# Patient Record
Sex: Male | Born: 1965 | Race: Black or African American | Hispanic: No | Marital: Married | Smoking: Former smoker
Health system: Southern US, Community
[De-identification: ages and names within clinical notes are randomized; demographics above are authoritative.]

## PROBLEM LIST (undated history)

## (undated) DIAGNOSIS — E7800 Pure hypercholesterolemia, unspecified: Secondary | ICD-10-CM

## (undated) DIAGNOSIS — R079 Chest pain, unspecified: Secondary | ICD-10-CM

## (undated) DIAGNOSIS — Z87442 Personal history of urinary calculi: Secondary | ICD-10-CM

## (undated) DIAGNOSIS — E119 Type 2 diabetes mellitus without complications: Secondary | ICD-10-CM

## (undated) DIAGNOSIS — R030 Elevated blood-pressure reading, without diagnosis of hypertension: Secondary | ICD-10-CM

## (undated) DIAGNOSIS — R9389 Abnormal findings on diagnostic imaging of other specified body structures: Secondary | ICD-10-CM

## (undated) DIAGNOSIS — I10 Essential (primary) hypertension: Secondary | ICD-10-CM

## (undated) DIAGNOSIS — IMO0001 Reserved for inherently not codable concepts without codable children: Secondary | ICD-10-CM

## (undated) HISTORY — PX: FRACTURE SURGERY: SHX138

## (undated) HISTORY — PX: HAND TENDON SURGERY: SHX663

---

## 1999-11-15 ENCOUNTER — Encounter: Payer: Self-pay | Admitting: Emergency Medicine

## 1999-11-15 ENCOUNTER — Emergency Department (HOSPITAL_COMMUNITY): Admission: EM | Admit: 1999-11-15 | Discharge: 1999-11-15 | Payer: Self-pay | Admitting: Emergency Medicine

## 2005-03-31 ENCOUNTER — Emergency Department (HOSPITAL_COMMUNITY): Admission: EM | Admit: 2005-03-31 | Discharge: 2005-03-31 | Payer: Self-pay | Admitting: Emergency Medicine

## 2005-04-08 ENCOUNTER — Ambulatory Visit: Payer: Self-pay | Admitting: Pulmonary Disease

## 2005-04-30 ENCOUNTER — Ambulatory Visit: Payer: Self-pay | Admitting: Pulmonary Disease

## 2006-06-25 ENCOUNTER — Ambulatory Visit: Payer: Self-pay | Admitting: Cardiovascular Disease

## 2009-04-02 ENCOUNTER — Encounter: Payer: Self-pay | Admitting: Cardiovascular Disease

## 2009-04-04 ENCOUNTER — Encounter: Payer: Self-pay | Admitting: Cardiovascular Disease

## 2009-04-25 DIAGNOSIS — Z9189 Other specified personal risk factors, not elsewhere classified: Secondary | ICD-10-CM | POA: Insufficient documentation

## 2009-05-01 ENCOUNTER — Ambulatory Visit: Payer: Self-pay | Admitting: Cardiovascular Disease

## 2009-05-01 DIAGNOSIS — R072 Precordial pain: Secondary | ICD-10-CM | POA: Insufficient documentation

## 2009-05-02 ENCOUNTER — Telehealth (INDEPENDENT_AMBULATORY_CARE_PROVIDER_SITE_OTHER): Payer: Self-pay | Admitting: *Deleted

## 2009-05-08 ENCOUNTER — Telehealth (INDEPENDENT_AMBULATORY_CARE_PROVIDER_SITE_OTHER): Payer: Self-pay | Admitting: *Deleted

## 2009-05-09 ENCOUNTER — Ambulatory Visit: Payer: Self-pay | Admitting: Cardiology

## 2009-05-09 ENCOUNTER — Encounter (HOSPITAL_COMMUNITY): Admission: RE | Admit: 2009-05-09 | Discharge: 2009-06-07 | Payer: Self-pay | Admitting: Cardiovascular Disease

## 2009-05-09 ENCOUNTER — Ambulatory Visit: Payer: Self-pay

## 2009-05-15 ENCOUNTER — Encounter (INDEPENDENT_AMBULATORY_CARE_PROVIDER_SITE_OTHER): Payer: Self-pay

## 2009-10-08 ENCOUNTER — Encounter: Admission: RE | Admit: 2009-10-08 | Discharge: 2009-10-08 | Payer: Self-pay | Admitting: Family Medicine

## 2010-02-07 ENCOUNTER — Encounter: Admission: RE | Admit: 2010-02-07 | Discharge: 2010-02-07 | Payer: Self-pay | Admitting: Family Medicine

## 2010-09-28 ENCOUNTER — Emergency Department (HOSPITAL_COMMUNITY)
Admission: EM | Admit: 2010-09-28 | Discharge: 2010-09-28 | Disposition: A | Discharge status: Home / Self Care | Payer: Managed Care, Other (non HMO) | Attending: Emergency Medicine | Admitting: Emergency Medicine

## 2010-09-28 DIAGNOSIS — S61209A Unspecified open wound of unspecified finger without damage to nail, initial encounter: Secondary | ICD-10-CM | POA: Insufficient documentation

## 2010-09-28 DIAGNOSIS — M79609 Pain in unspecified limb: Secondary | ICD-10-CM | POA: Insufficient documentation

## 2010-09-28 DIAGNOSIS — Y929 Unspecified place or not applicable: Secondary | ICD-10-CM | POA: Insufficient documentation

## 2010-09-28 DIAGNOSIS — W268XXA Contact with other sharp object(s), not elsewhere classified, initial encounter: Secondary | ICD-10-CM | POA: Insufficient documentation

## 2010-10-25 NOTE — Procedures (Signed)
[facility]                              EXERCISE TREADMILL   NAME:Raymond Harrell, Raymond Harrell                MRN:          [id number]  DATE:06/25/2005                            DOB:          1965-12-27    INDICATION:  Raymond Harrell is a 45 year old male with atypical chest  pain and nonspecific T wave changes on his resting EKG.   Raymond Harrell exercised according to the Bruce protocol for 7 minutes  and 42 seconds, achieving a work level of 9.8 metabolic equivalents. He  achieved 86% of his age predicted maximal heart rate with a heart rate  of 155 beats per minute. Raymond Harrell had a resting blood pressure of  146/88 and a peak blood pressure of 203/80 at state 3 of exercise. The  test was stopped due to fatigue. He had no significant ST changes or  arrhythmias with exercise. He had transient sharp, atypical chest pain  during stage 3 of exercise that lasted for only a few seconds. He has  fair exercise tolerance. He had a hypertensive response to exercise.   FINAL CONCLUSION:  A negative exercise ECG.     Raymond Harrell. Excell Seltzer, MD  Electronically Signed    MDC/MedQ  DD: 06/25/2006  DT: 06/25/2006  Job #: [id number]   cc:   Jonita Albee, M.D.

## 2010-10-25 NOTE — Letter (Signed)
June 25, 2006    Jonita Albee, M.D.  [facility]  [address]  [place], [place] [address]   RE:  Raymond Harrell, Raymond Harrell  MRN:  [id number]  /  DOB:  [birth year]   Dear Dr. Perrin Maltese:   I saw Raymond Harrell today as an outpatient at the [facility]. As you know, he is a very nice 45 year old male who  presents for evaluation of chest pain. He describes a recent onset of  sharp stabbing pains in the left chest that lasts for only a few  seconds. They tend to occur at any time and seem to be unrelated to  exertion. The pains are non-pleuritic. He had a similar episode  approximately 1 year ago that resolved on its own. He describes 3 to 4  episodes of this pain daily. He does not have any associated symptoms of  diaphoresis, nausea, vomiting or lightheadedness. He has no past history  of cardiac problems.   Medications currently: On doxycycline for a 10-day course.   Allergies: No known drug allergies.   Past medical history: The patient has no history of medical problems or  surgeries.   Family history: His mother died at age 17 of an infection. His father  died in his 86s of heart disease. He has 3 siblings in their 40s and  30s, all of whom are healthy.   Social history: The patient is originally from [place]. He works as  a Engineer, manufacturing systems, Designer, jewellery. He is married with 3 children. He  does not smoke cigarettes, but is a former cigarette smoker. He quit in  March of 2006. He has no history of drug use. He drinks 40 ounces of  beer daily. He drinks 1 to 2 cups of caffeinated beverages daily.   Review of systems: A complete 12-point review systems was performed.  Pertinent positives included occasional lightheadedness and sexual  dysfunction. Also, of note, he has gained 30 pounds over the last 1  year. All other systems were reviewed and are negative except as  described above.   Physical examination: The patient is alert and  oriented. He is in no  acute distress. His weight is 270 pounds. Height is 6 feet, 5 inches.  Blood pressure is 130/82, heart rate is 70, respiratory rate is 12.  HEENT: Is normal.  NECK: Normal carotid upstrokes without bruits. Jugular venous pressure  is normal. There is no thyromegaly or thyroid nodules. There is no  cervical lymphadenopathy.  LUNGS:  Clear to auscultation bilaterally.  CARDIOVASCULAR: Apex is discrete and nondisplaced. The heart is regular  rate and rhythm without murmurs or gallops.  ABDOMEN: Soft and nontender. No organomegaly. No abdominal bruits. No  rebound or guarding.  EXTREMITIES: No clubbing, cyanosis or edema. Peripheral pulses are 2+  and equal throughout.  SKIN: Is warm and dry without rash.  NEUROLOGIC: Cranial nerves II-XII are intact. Strength is 5/5 and equal  in the arms and legs bilaterally.   EKG: Demonstrates normal sinus rhythm with a nonspecific T-wave  abnormality.   Assessment: Raymond Harrell is a 45 year old gentleman with atypical  chest pain. His cardiac risks include former tobacco use, obesity, and  family history of heart disease. I elected to perform an exercise EKG  stress test today. Raymond Harrell exercised into Stage 3 of the Bruce  protocol. He had some transient sharp chest pain during exercise. He had  no ST segment changes and had a negative stress  test.   In summary, Raymond Harrell is likely having non-cardiac chest pain. I  reassured him. I advised him of the importance of regular aerobic  exercise and weight loss as he is clearly is at risk of cardiovascular  problems down the road as he ages if he continues to gain weight.   Dr. Perrin Maltese, thanks again for allowing me to see Raymond Harrell. I am  also sending you a copy of his formal stress test report. Please feel  free to call me at any time with questions regarding his care.    Sincerely,      Veverly Fells. Excell Seltzer, MD  Electronically Signed    MDC/MedQ  DD:  06/25/2006  DT: 06/25/2006  Job #: [id number]

## 2011-06-20 ENCOUNTER — Ambulatory Visit (INDEPENDENT_AMBULATORY_CARE_PROVIDER_SITE_OTHER): Payer: Managed Care, Other (non HMO)

## 2011-06-20 DIAGNOSIS — R209 Unspecified disturbances of skin sensation: Secondary | ICD-10-CM

## 2012-02-18 ENCOUNTER — Encounter (HOSPITAL_COMMUNITY): Payer: Self-pay | Admitting: Emergency Medicine

## 2012-02-18 ENCOUNTER — Inpatient Hospital Stay (HOSPITAL_COMMUNITY): Payer: Managed Care, Other (non HMO)

## 2012-02-18 ENCOUNTER — Inpatient Hospital Stay (HOSPITAL_COMMUNITY)
Admission: EM | Admit: 2012-02-18 | Discharge: 2012-02-20 | DRG: 204 | Disposition: A | Discharge status: Home / Self Care | Payer: Managed Care, Other (non HMO) | Attending: Cardiovascular Disease | Admitting: Cardiovascular Disease

## 2012-02-18 ENCOUNTER — Emergency Department (HOSPITAL_COMMUNITY): Payer: Managed Care, Other (non HMO)

## 2012-02-18 DIAGNOSIS — R071 Chest pain on breathing: Principal | ICD-10-CM | POA: Diagnosis present

## 2012-02-18 DIAGNOSIS — E785 Hyperlipidemia, unspecified: Secondary | ICD-10-CM | POA: Diagnosis present

## 2012-02-18 DIAGNOSIS — J9819 Other pulmonary collapse: Secondary | ICD-10-CM | POA: Diagnosis present

## 2012-02-18 DIAGNOSIS — E119 Type 2 diabetes mellitus without complications: Secondary | ICD-10-CM | POA: Diagnosis present

## 2012-02-18 DIAGNOSIS — I517 Cardiomegaly: Secondary | ICD-10-CM

## 2012-02-18 DIAGNOSIS — R9431 Abnormal electrocardiogram [ECG] [EKG]: Secondary | ICD-10-CM | POA: Diagnosis present

## 2012-02-18 DIAGNOSIS — R079 Chest pain, unspecified: Secondary | ICD-10-CM

## 2012-02-18 DIAGNOSIS — J479 Bronchiectasis, uncomplicated: Secondary | ICD-10-CM | POA: Diagnosis present

## 2012-02-18 DIAGNOSIS — Z87891 Personal history of nicotine dependence: Secondary | ICD-10-CM

## 2012-02-18 DIAGNOSIS — R030 Elevated blood-pressure reading, without diagnosis of hypertension: Secondary | ICD-10-CM | POA: Diagnosis present

## 2012-02-18 DIAGNOSIS — R918 Other nonspecific abnormal finding of lung field: Secondary | ICD-10-CM | POA: Diagnosis present

## 2012-02-18 DIAGNOSIS — R7611 Nonspecific reaction to tuberculin skin test without active tuberculosis: Secondary | ICD-10-CM

## 2012-02-18 DIAGNOSIS — E039 Hypothyroidism, unspecified: Secondary | ICD-10-CM

## 2012-02-18 DIAGNOSIS — Z7982 Long term (current) use of aspirin: Secondary | ICD-10-CM

## 2012-02-18 DIAGNOSIS — R946 Abnormal results of thyroid function studies: Secondary | ICD-10-CM | POA: Diagnosis present

## 2012-02-18 DIAGNOSIS — R0781 Pleurodynia: Secondary | ICD-10-CM | POA: Diagnosis present

## 2012-02-18 DIAGNOSIS — Z9189 Other specified personal risk factors, not elsewhere classified: Secondary | ICD-10-CM

## 2012-02-18 HISTORY — DX: Abnormal findings on diagnostic imaging of other specified body structures: R93.89

## 2012-02-18 HISTORY — DX: Elevated blood-pressure reading, without diagnosis of hypertension: R03.0

## 2012-02-18 HISTORY — DX: Chest pain, unspecified: R07.9

## 2012-02-18 HISTORY — DX: Pure hypercholesterolemia, unspecified: E78.00

## 2012-02-18 HISTORY — DX: Type 2 diabetes mellitus without complications: E11.9

## 2012-02-18 HISTORY — DX: Reserved for inherently not codable concepts without codable children: IMO0001

## 2012-02-18 LAB — PROTIME-INR
INR: 1.01 (ref 0.00–1.49)
Prothrombin Time: 13.5 seconds (ref 11.6–15.2)

## 2012-02-18 LAB — COMPREHENSIVE METABOLIC PANEL
ALT: 33 U/L (ref 0–53)
AST: 26 U/L (ref 0–37)
CO2: 21 mEq/L (ref 19–32)
Calcium: 9.4 mg/dL (ref 8.4–10.5)
Chloride: 101 mEq/L (ref 96–112)
GFR calc non Af Amer: >90 mL/min (ref >90)
Sodium: 136 mEq/L (ref 135–145)

## 2012-02-18 LAB — CBC
HCT: 41 % (ref 39.0–52.0)
Hemoglobin: 13.7 g/dL (ref 13.0–17.0)
WBC: 7.1 10*3/uL (ref 4.0–10.5)

## 2012-02-18 LAB — URINE MICROSCOPIC-ADD ON

## 2012-02-18 LAB — URINALYSIS, ROUTINE W REFLEX MICROSCOPIC
Glucose, UA: NEGATIVE mg/dL
Hgb urine dipstick: NEGATIVE
Protein, ur: 100 mg/dL — AB
Specific Gravity, Urine: 1.025 (ref 1.005–1.030)

## 2012-02-18 LAB — TROPONIN I: Troponin I: <0.3 ng/mL (ref <0.30)

## 2012-02-18 LAB — POCT I-STAT 3, ART BLOOD GAS (G3+): Acid-Base Excess: 1 mmol/L (ref 0.0–2.0)

## 2012-02-18 LAB — CBC WITH DIFFERENTIAL/PLATELET
Basophils Absolute: 0 10*3/uL (ref 0.0–0.1)
Eosinophils Relative: 1 % (ref 0–5)
Lymphocytes Relative: 53 % — ABNORMAL HIGH (ref 12–46)
Neutro Abs: 2.5 10*3/uL (ref 1.7–7.7)
Neutrophils Relative %: 37 % — ABNORMAL LOW (ref 43–77)
Platelets: 169 10*3/uL (ref 150–400)
RDW: 14.7 % (ref 11.5–15.5)
WBC: 6.6 10*3/uL (ref 4.0–10.5)

## 2012-02-18 LAB — HEPARIN LEVEL (UNFRACTIONATED): Heparin Unfractionated: 0.11 IU/mL — ABNORMAL LOW (ref 0.30–0.70)

## 2012-02-18 MED ORDER — HEPARIN (PORCINE) IN NACL 100-0.45 UNIT/ML-% IJ SOLN
1850.0000 [IU]/h | INTRAMUSCULAR | Status: DC
  Administered 2012-02-18 (×2): 1850 [IU]/h via INTRAVENOUS
  Filled 2012-02-18 (×5): qty 250

## 2012-02-18 MED ORDER — ALPRAZOLAM 0.25 MG PO TABS
0.5000 mg | ORAL_TABLET | Freq: Two times a day (BID) | ORAL | Status: DC | PRN
  Filled 2012-02-18: qty 1

## 2012-02-18 MED ORDER — HEPARIN (PORCINE) IN NACL 100-0.45 UNIT/ML-% IJ SOLN
1500.0000 [IU]/h | Freq: Once | INTRAMUSCULAR | Status: AC
  Administered 2012-02-18: 1500 [IU]/h via INTRAVENOUS
  Filled 2012-02-18: qty 250

## 2012-02-18 MED ORDER — ASPIRIN 325 MG PO TABS
325.0000 mg | ORAL_TABLET | Freq: Every day | ORAL | Status: DC
  Administered 2012-02-19 – 2012-02-20 (×2): 325 mg via ORAL
  Filled 2012-02-18 (×2): qty 1

## 2012-02-18 MED ORDER — ONDANSETRON HCL 4 MG/2ML IJ SOLN: 4.0000 mg | Freq: Four times a day (QID) | INTRAMUSCULAR | Status: DC | PRN

## 2012-02-18 MED ORDER — HEPARIN (PORCINE) IN NACL 100-0.45 UNIT/ML-% IJ SOLN: 16.0000 [IU]/kg/h | Freq: Once | INTRAMUSCULAR | Status: DC

## 2012-02-18 MED ORDER — HEPARIN BOLUS VIA INFUSION
3000.0000 [IU] | Freq: Once | INTRAVENOUS | Status: AC
  Administered 2012-02-18: 20:00:00 3000 [IU] via INTRAVENOUS

## 2012-02-18 MED ORDER — ACETAMINOPHEN 325 MG PO TABS: 650.0000 mg | ORAL_TABLET | ORAL | Status: DC | PRN

## 2012-02-18 MED ORDER — MORPHINE SULFATE 4 MG/ML IJ SOLN
4.0000 mg | Freq: Once | INTRAMUSCULAR | Status: AC
  Administered 2012-02-18: 4 mg via INTRAVENOUS
  Filled 2012-02-18: qty 1

## 2012-02-18 MED ORDER — NITROGLYCERIN 0.4 MG SL SUBL: 0.4000 mg | SUBLINGUAL_TABLET | SUBLINGUAL | Status: DC | PRN

## 2012-02-18 MED ORDER — IOHEXOL 350 MG/ML SOLN
100.0000 mL | Freq: Once | INTRAVENOUS | Status: AC | PRN
  Administered 2012-02-18: 100 mL via INTRAVENOUS

## 2012-02-18 MED ORDER — OMEGA-3-ACID ETHYL ESTERS 1 G PO CAPS
1.0000 g | ORAL_CAPSULE | Freq: Every day | ORAL | Status: DC
  Administered 2012-02-19 – 2012-02-20 (×2): 1 g via ORAL
  Filled 2012-02-18 (×2): qty 1

## 2012-02-18 MED ORDER — SODIUM CHLORIDE 0.9 % IV SOLN: 250.0000 mL | INTRAVENOUS | Status: DC | PRN

## 2012-02-18 MED ORDER — SODIUM CHLORIDE 0.9 % IJ SOLN
3.0000 mL | Freq: Two times a day (BID) | INTRAMUSCULAR | Status: DC
  Administered 2012-02-19: 10:00:00 3 mL via INTRAVENOUS

## 2012-02-18 MED ORDER — HYDROMORPHONE HCL PF 1 MG/ML IJ SOLN
1.0000 mg | INTRAMUSCULAR | Status: DC | PRN
  Administered 2012-02-18 – 2012-02-19 (×2): 1 mg via INTRAVENOUS
  Filled 2012-02-18 (×4): qty 1

## 2012-02-18 MED ORDER — KETOROLAC TROMETHAMINE 30 MG/ML IJ SOLN
30.0000 mg | Freq: Four times a day (QID) | INTRAMUSCULAR | Status: DC | PRN
  Administered 2012-02-18 – 2012-02-19 (×2): 30 mg via INTRAVENOUS
  Filled 2012-02-18 (×2): qty 1

## 2012-02-18 MED ORDER — NITROGLYCERIN IN D5W 200-5 MCG/ML-% IV SOLN
5.0000 ug/min | Freq: Once | INTRAVENOUS | Status: AC
  Administered 2012-02-18: 20:00:00 40 ug/min via INTRAVENOUS

## 2012-02-18 MED ORDER — HEPARIN BOLUS VIA INFUSION
4000.0000 [IU] | Freq: Once | INTRAVENOUS | Status: AC
  Administered 2012-02-18: 4000 [IU] via INTRAVENOUS

## 2012-02-18 MED ORDER — OMEGA-3 FATTY ACIDS 1000 MG PO CAPS: 1.0000 g | ORAL_CAPSULE | Freq: Every day | ORAL | Status: DC

## 2012-02-18 MED ORDER — NITROGLYCERIN IN D5W 200-5 MCG/ML-% IV SOLN
2.0000 ug/min | Freq: Once | INTRAVENOUS | Status: AC
  Administered 2012-02-18: 10 ug/min via INTRAVENOUS
  Filled 2012-02-18: qty 250

## 2012-02-18 MED ORDER — SODIUM CHLORIDE 0.9 % IJ SOLN: 3.0000 mL | INTRAMUSCULAR | Status: DC | PRN

## 2012-02-18 NOTE — ED Notes (Signed)
Pt started having muscle cramps in bil legs. Stated that this has been going on for a while.

## 2012-02-18 NOTE — ED Notes (Signed)
Pt went to radiology.

## 2012-02-18 NOTE — H&P (Signed)
History and Physical  Patient ID: Raymond Harrell MRN: [id number], DOB: 20-Mar-1966 Date of Encounter: 02/18/2012, 4:58 PM Primary Physician: [name], MD Primary Cardiologist: Dr. Excell Seltzer in 2010  Chief Complaint: chest pain  HPI: Raymond Harrell is a 46 y/o M who denies any prior cardiac hx but with ?glucose today, ?BP recently, former tobacco abuse, family hx of CAD who had a stress test for chest pain in 2010 that was normal in the setting of abnormal EKG. He reports that since Monday he has felt more progressively SOB with ambulation accompanied by some L sided sharp, stabbing chest radiating to his back. The pain has become markedly worse with inspiration and now occurs every time he breathes in. When it occurs, it lasts for brief seconds for a time. He also has had diaphoresis, but this predates today's symptoms. He took 2 ibuprofen on Monday but this did not help. He also takes a daily aspirin. He had an episode of nausea/vomiting x1 without hematemesis this morning, but was able to go on with his shower and drove on to work. While at work he noted increasing chest discomfort so an EKG was performed at his work and he was sent to the ER. He received 4 baby ASA prior to arrival. He also got SL NTG in route which did not change the pain. In the ER, he received a total of 8mg  of morphine with some reduction in his pain, heparin initiation, and NTG gtt now at 27mcg/min. He is not hypoxic or tachycardic, but looks diaphoretic and uncomfortable on exam. Troponin neg x 1, d-dimer negative, glu 136, otherwise labs unremarkable. EKG is shows NSR PACs with TWI I, II, avL, V4-V6 with slight coved ST elevation V4 as well as J pt elevation V2-V3 -- although this appears similar to 2010 tracing.  CXR shows minimal increase in the pleural parenchymal opacity at the right apex since 2006 most consistent with post infectious or inflammatory scarring/bronchiectasis, diffuse mild interstitial  thickening. He denies any recent illness, cough, fever, chills, congestion, LEE, orthopnea, recent travel/surgery/bedrest, personal or family hx of blood clots. Prior to Monday (9/9), he felt in his usual state of health without CP or SOB.  Past Medical History  Diagnosis Date  . Chest pain     Nuc 2010 negative (EKG abnormal at that time)     Most Recent Cardiac Studies: Nuclear stress test 2010 Exercise Capacity: Fair exercise capacity.  BP Response: Hypertensive blood pressure response.  Clinical Symptoms: No chest pain  ECG Impression: Scattered PVCs.  Overall Impression: Normal stress nuclear study.   Surgical History:  Past Surgical History  Procedure Date  . Hand tendon surgery      Home Meds: Prior to Admission medications   Medication Sig Start Date End Date Taking? Authorizing Provider  aspirin 325 MG tablet Take 325 mg by mouth daily.   Yes Historical Provider, MD  fish oil-omega-3 fatty acids 1000 MG capsule Take 1 g by mouth daily.   Yes Historical Provider, MD    Allergies: No Known Allergies  History   Social History  . Marital Status: Married    Spouse Name: N/A    Number of Children: N/A  . Years of Education: N/A   Occupational History  . Not on file.   Social History Main Topics  . Smoking status: Former Games developer  . Smokeless tobacco: Not on file  . Alcohol Use: 0.0 oz/week     1 beer or 1 shot of liquor  daily  . Drug Use: No  . Sexually Active: Not on file   Other Topics Concern  . Not on file   Social History Narrative  . No narrative on file     Family History  Problem Relation Age of Onset  . Heart attack Father     MI in his 63's    Review of Systems: General: negative for chills, fever, night sweats or weight changes.  Cardiovascular: see above Dermatological: negative for rash Respiratory: negative for cough or wheezing Urologic: negative for hematuria Abdominal: negative for diarrhea, bright red blood per rectum, melena, or  hematemesis. See above Neurologic: negative for visual changes, syncope, or dizziness All other systems reviewed and are otherwise negative except as noted above.  Labs:   Lab Results  Component Value Date   WBC 6.6 02/18/2012   HGB 14.1 02/18/2012   HCT 42.2 02/18/2012   MCV 77.9* 02/18/2012   PLT 169 02/18/2012     Lab 02/18/12 0952  NA 136  K 3.8  CL 101  CO2 21  BUN 14  CREATININE 0.93  CALCIUM 9.4  PROT 7.7  BILITOT 0.3  ALKPHOS 80  ALT 33  AST 26  GLUCOSE 137*    Basename 02/18/12 1404 02/18/12 0952  CKTOTAL -- --  CKMB -- --  TROPONINI <0.30 <0.30   No results found for this basename: CHOL,  HDL,  LDLCALC,  TRIG   Lab Results  Component Value Date   DDIMER <0.27 02/18/2012    Radiology/Studies:  Dg Chest 2 View 02/18/2012  *RADIOLOGY REPORT*  Clinical Data: Chest pain.  Ex-smoker.  CHEST - 2 VIEW  Comparison: 03/31/2005 and CT of 03/31/2005.  Findings: Numerous leads and wires project over the chest.  Mild right hemidiaphragm elevation.  The trachea patient minimally rotated right. Normal heart size.  No pleural effusion or pneumothorax.  Right apical pleural parenchymal opacity is felt to be minimally progressive since 2006.  This corresponds to bronchiectasis and architectural distortion of the right apex on prior CT.  There is also diffuse interstitial prominence which is nonspecific and slightly progressive since the prior exam.  No new lobar consolidation.  IMPRESSION:  1.  Likely minimal increase in the pleural parenchymal opacity at the right apex since 2006.  Most consistent with post infectious or inflammatory scarring/bronchiectasis. 2.  Diffuse mild interstitial thickening which could relate to the clinical history of smoking / chronic bronchitis.   Original Report Authenticated By: Consuello Bossier, M.D.     EKG: NSR PACs with TWI I, II, avL, V4-V6 with slight coved ST elevation V4 as well as J pt elevation V2-V3 -- although this appears similar to 2010  tracing.  Physical Exam: Blood pressure 120/72, pulse 66, temperature 98 F (36.7 C), temperature source Oral, resp. rate 18, height 6\' 2"  (1.88 m), weight 279 lb 15.8 oz (127 kg), SpO2 99.00%. General: Well developed, well nourished AAM who appears to be in mild distress and uncomfortable, with some diaphoresis. He is sitting straight up in bed. Head: Normocephalic, atraumatic, sclera non-icteric, no xanthomas, nares are without discharge.  Neck: Negative for carotid bruits. JVD not elevated. Lungs: Clear bilaterally to auscultation without wheezes, rales, or rhonchi. Breathing is unlabored. Heart: RRR with S1 S2, occasional ectopy noted. No murmurs, rubs, or gallops appreciated. Abdomen: Soft, non-tender, non-distended with normoactive bowel sounds. No hepatomegaly. No rebound/guarding. No obvious abdominal masses. Msk:  Strength and tone appear normal for age. Extremities: No clubbing or cyanosis. No edema.  Distal pedal pulses are 2+ and equal bilaterally. Neuro: Alert and oriented X 3. Moves all extremities spontaneously. Psych:  Responds to questions appropriately with a normal affect.    ASSESSMENT AND PLAN:   1. Chest pain/SOB 2. Elevated glucose 3. Recently elevated BP readings  His clinical picture is unclear. EKG is abnormal but similar to 2010. His pain is not typical for ACS given pleuritic nature. We will admit him and cycle enzymes. Agree with daily ASA, continuing heparin, NTG gtt. Will try dilaudid for pain control as BP allows. D-dimer, troponin, WBC are normal. However, given his acutely tachypnic appearance, will plan for stat 2D echocardiogram. If this is not remarkably enlightening, will proceed with CT angio to rule out PE (there is a subset of high risk pts who have PE despite negative d-dimer). Check A1C. Follow BP. Further recommendations will be based on the above studies.   Signed, Ronie Spies PA-C 02/18/2012, 4:58 PM  Patient seen, examined. Available data  reviewed. Agree with findings, assessment, and plan as outlined by Ronie Spies, PA-C. The patient was independently interviewed and examined. He is uncomfortable, but chest pain is clearly pleuritic in nature. EKG is abnormal but not significantly changed from previous. Pt known to me from outpatient encounter a few years ago. I agree with stat 2D echo to rule out pericardial process or significant segmental LV dysfunction. Also would check chest CT, even in setting of negative d-dimer, to rule out PE or dissection. Further followup pending the results of these studies.  Tonny Bollman, M.D. 02/18/2012 8:34 PM

## 2012-02-18 NOTE — Progress Notes (Signed)
  Echocardiogram 2D Echocardiogram has been performed.  Raymond Harrell FRANCES 02/18/2012, 5:32 PM 

## 2012-02-18 NOTE — Consult Note (Signed)
ANTICOAGULATION CONSULT NOTE - Initial Consult  Pharmacy Consult for Heparin Indication: Chest Pain  Allergies: No Known Allergies  Height/Weight: Height: 6\' 2"  (188 cm) Weight: 279 lb 15.8 oz (127 kg) IBW/kg (Calculated) : 82.2  Heparin dosing weight 110 kg  Vital Signs: BP 133/80  Pulse 66  Temp 98 F (36.7 C) (Oral)  Resp 19  Ht 6\' 2"  (1.88 m)  Wt 279 lb 15.8 oz (127 kg)  BMI 35.95 kg/m2  SpO2 99%  Active Problems: Active Problems:  * No active hospital problems. *    Labs: No lab results available.   Medical / Surgical History: Diagnosis  . Hyperlipemia   Procedure  . Hand tendon surgery    Medications:   (Not in a hospital admission) Scheduled:    . heparin  1,500 Units/hr Intravenous  . heparin  4,000 Units Intravenous  . nitroGLYCERIN  2-200 mcg/min Intravenous    Assessment:  Raymond Harrell admitted for Chest Pain, CE remain negative. Xa level below goal, will bolus and inc gtt rate.  Goal of Therapy:   Heparin level 0.3-0.7 units/ml      Plan:   Heparin 3000 unit bolus, then increase gtt to 1850 units/hr Check xa level in 6 hours Daily Heparin level and CBC while on Heparin.   Verlene Mayer, PharmD, BCPS Pager [phone] 02/18/2012,  6:38 PM

## 2012-02-18 NOTE — Consult Note (Signed)
ANTICOAGULATION CONSULT NOTE - Initial Consult  Pharmacy Consult for Heparin Indication: Chest Pain  Allergies: No Known Allergies  Height/Weight: Height: 6\' 2"  (188 cm) Weight: 279 lb 15.8 oz (127 kg) IBW/kg (Calculated) : 82.2  Heparin dosing weight 110 kg  Vital Signs: BP 134/77  Pulse 70  Temp 98 F (36.7 C) (Oral)  Resp 20  Ht 6\' 2"  (1.88 m)  Wt 279 lb 15.8 oz (127 kg)  BMI 35.95 kg/m2  SpO2 99%  Active Problems: Active Problems:  * No active hospital problems. *    Labs: No lab results available.   Medical / Surgical History: Diagnosis  . Hyperlipemia   Procedure  . Hand tendon surgery    Medications:   (Not in a hospital admission) Scheduled:    . heparin  1,500 Units/hr Intravenous  . heparin  4,000 Units Intravenous  . nitroGLYCERIN  2-200 mcg/min Intravenous    Assessment:  46 y.o.male admitted for Chest Pain  Heparin protocol initiated.  Goal of Therapy:   Heparin level 0.3-0.7 units/ml      Plan:   Heparin 4000 unit bolus, then start Heparin at 1500 units/hr. Will check Heparin Level, CBC, INR in 6 hours. Daily Heparin level and CBC while on Heparin.   Rayane Gallardo, Elisha Headland, Pharm.D. 02/18/2012,  10:17 AM

## 2012-02-18 NOTE — ED Notes (Signed)
Cardiology at bedside.

## 2012-02-18 NOTE — ED Notes (Signed)
Pt returned from radiology.

## 2012-02-18 NOTE — ED Notes (Addendum)
Arrive by EMS. Pt stated that he started having CP around 1800 yesterday evening. Pt stated that he was just moving around, no shortness of breath at the time of onset. Pt stated that the pain has not gone away and the shortness of breath has been off and on. Stated that he was nauseated this morning when he woke up. Stated that it hurts to take a deep breath. CP radiates toward his back. EMS administered 2 Nitro and 324mg  Aspirin. Nitro did not relieve pain and a 3 dose was held d/t bp decrease of systolic 210 to 128.

## 2012-02-18 NOTE — ED Provider Notes (Signed)
History     CSN: [id number]  Arrival date & time 02/18/12  4098   First MD Initiated Contact with Patient 02/18/12 [phone]      Chief Complaint  Patient presents with  . Chest Pain    (Consider location/radiation/quality/duration/timing/severity/associated sxs/prior treatment) HPI Comments: Patient arrives via EMS with left-sided chest pain that has been intermittent for the past 3 days. It is somewhat worse with moving and worse with deep breathing. He denies any shortness of breath, nausea, vomiting, sweating. Pain radiates from his left side to his back and lasts several minutes to hours at a time. That ends in the past. He denies any previous cardiac history. Is given nitroglycerin and aspirin by EMS.  The history is provided by the patient and the EMS personnel.    Past Medical History  Diagnosis Date  . Hyperlipemia     Past Surgical History  Procedure Date  . Hand tendon surgery     No family history on file.  History  Substance Use Topics  . Smoking status: Former Games developer  . Smokeless tobacco: Not on file  . Alcohol Use: 12.6 oz/week    21 Cans of beer per week      Review of Systems  Constitutional: Negative for fever, activity change and appetite change.  HENT: Negative for congestion and rhinorrhea.   Eyes: Negative for visual disturbance.  Respiratory: Positive for chest tightness and shortness of breath. Negative for cough.   Cardiovascular: Positive for chest pain.  Gastrointestinal: Negative for nausea, vomiting and abdominal pain.  Genitourinary: Negative for dysuria.  Musculoskeletal: Negative for back pain.  Skin: Negative for rash.  Neurological: Negative for dizziness, light-headedness and headaches.    Allergies  Review of patient's allergies indicates no known allergies.  Home Medications   Current Outpatient Rx  Name Route Sig Dispense Refill  . ASPIRIN 325 MG PO TABS Oral Take 325 mg by mouth daily.    . OMEGA-3 FATTY ACIDS 1000 MG PO  CAPS Oral Take 1 g by mouth daily.      BP 120/72  Pulse 66  Temp 98 F (36.7 C) (Oral)  Resp 18  Ht 6\' 2"  (1.88 m)  Wt 279 lb 15.8 oz (127 kg)  BMI 35.95 kg/m2  SpO2 99%  Physical Exam  Constitutional: He is oriented to person, place, and time. He appears well-developed and well-nourished. No distress.       uncomfortable  HENT:  Head: Normocephalic and atraumatic.  Mouth/Throat: Oropharynx is clear and moist. No oropharyngeal exudate.  Eyes: Conjunctivae normal and EOM are normal. Pupils are equal, round, and reactive to light.  Neck: Normal range of motion. Neck supple.  Cardiovascular: Normal rate, regular rhythm and normal heart sounds.   No murmur heard. Pulmonary/Chest: Effort normal and breath sounds normal. No respiratory distress.  Abdominal: Soft. There is no tenderness. There is no rebound and no guarding.  Musculoskeletal: Normal range of motion. He exhibits no edema and no tenderness.  Neurological: He is alert and oriented to person, place, and time. No cranial nerve deficit.  Skin: Skin is warm. He is diaphoretic.    ED Course  Procedures (including critical care time)  Labs Reviewed  CBC WITH DIFFERENTIAL - Abnormal; Notable for the following:    MCV 77.9 (*)     Neutrophils Relative 37 (*)     Lymphocytes Relative 53 (*)     All other components within normal limits  COMPREHENSIVE METABOLIC PANEL - Abnormal; Notable for the following:  Glucose, Bld 137 (*)     All other components within normal limits  URINALYSIS, ROUTINE W REFLEX MICROSCOPIC - Abnormal; Notable for the following:    Color, Urine AMBER (*)  BIOCHEMICALS MAY BE AFFECTED BY COLOR   APPearance CLOUDY (*)     Protein, ur 100 (*)     All other components within normal limits  URINE MICROSCOPIC-ADD ON - Abnormal; Notable for the following:    Squamous Epithelial / LPF FEW (*)     All other components within normal limits  TROPONIN I  D-DIMER, QUANTITATIVE  TROPONIN I  HEPARIN LEVEL  (UNFRACTIONATED)  CBC   Dg Chest 2 View  02/18/2012  *RADIOLOGY REPORT*  Clinical Data: Chest pain.  Ex-smoker.  CHEST - 2 VIEW  Comparison: 03/31/2005 and CT of 03/31/2005.  Findings: Numerous leads and wires project over the chest.  Mild right hemidiaphragm elevation.  The trachea patient minimally rotated right. Normal heart size.  No pleural effusion or pneumothorax.  Right apical pleural parenchymal opacity is felt to be minimally progressive since 2006.  This corresponds to bronchiectasis and architectural distortion of the right apex on prior CT.  There is also diffuse interstitial prominence which is nonspecific and slightly progressive since the prior exam.  No new lobar consolidation.  IMPRESSION:  1.  Likely minimal increase in the pleural parenchymal opacity at the right apex since 2006.  Most consistent with post infectious or inflammatory scarring/bronchiectasis. 2.  Diffuse mild interstitial thickening which could relate to the clinical history of smoking / chronic bronchitis.   Original Report Authenticated By: Consuello Bossier, M.D.      1. Chest pain   2. Abnormal EKG       MDM  Intermittent left-sided chest pain worse with deep breathing, worse with moving. Somewhat atypical for angina. Atypical for dissection. Patient does have EKG changes in the form of lateral ST depressions in anterior ST elevation. Patient had a negative nuclear stress test Dec 2010.  Troponin negative, d-dimer negative. ASA, heparin, nitro for ongoing pain.   cardiology consulted.  With ongoing pain despite nitro and narcotics, will proceed with CT angio of chest to evaluate for PE and dissection despite negative D-dimer.   Date: 02/18/2012  Rate: 65  Rhythm: normal sinus rhythm  QRS Axis: normal  Intervals: normal  ST/T Wave abnormalities: nonspecific ST/T changes, ST elevations anteriorly and ST depressions laterally  Conduction Disutrbances:none  Narrative Interpretation: Anterior ST  elevation similar to 15 2006, V5 and V6 ST depressions and T wave inversions appear to be new. However EKG from 2010 shows similar T wave inversions laterally  Old EKG Reviewed: changes noted         Glynn Octave, MD 02/18/12 [phone]

## 2012-02-19 DIAGNOSIS — R7611 Nonspecific reaction to tuberculin skin test without active tuberculosis: Secondary | ICD-10-CM

## 2012-02-19 DIAGNOSIS — E039 Hypothyroidism, unspecified: Secondary | ICD-10-CM | POA: Diagnosis present

## 2012-02-19 DIAGNOSIS — R918 Other nonspecific abnormal finding of lung field: Secondary | ICD-10-CM

## 2012-02-19 DIAGNOSIS — J479 Bronchiectasis, uncomplicated: Secondary | ICD-10-CM

## 2012-02-19 DIAGNOSIS — E119 Type 2 diabetes mellitus without complications: Secondary | ICD-10-CM

## 2012-02-19 DIAGNOSIS — R0781 Pleurodynia: Secondary | ICD-10-CM | POA: Diagnosis present

## 2012-02-19 DIAGNOSIS — R071 Chest pain on breathing: Principal | ICD-10-CM

## 2012-02-19 DIAGNOSIS — Z9189 Other specified personal risk factors, not elsewhere classified: Secondary | ICD-10-CM

## 2012-02-19 LAB — RAPID URINE DRUG SCREEN, HOSP PERFORMED
Benzodiazepines: NOT DETECTED
Cocaine: NOT DETECTED
Opiates: POSITIVE — AB
Tetrahydrocannabinol: NOT DETECTED

## 2012-02-19 LAB — LIPID PANEL
Cholesterol: 242 mg/dL — ABNORMAL HIGH (ref 0–200)
HDL: 71 mg/dL (ref >39)
Triglycerides: 156 mg/dL — ABNORMAL HIGH (ref <150)
VLDL: 31 mg/dL (ref 0–40)

## 2012-02-19 LAB — TSH
TSH: 7.774 u[IU]/mL — ABNORMAL HIGH (ref 0.350–4.500)
TSH: 1.297 u[IU]/mL (ref 0.350–4.500)

## 2012-02-19 LAB — GLUCOSE, CAPILLARY
Glucose-Capillary: 143 mg/dL — ABNORMAL HIGH (ref 70–99)
Glucose-Capillary: 78 mg/dL (ref 70–99)

## 2012-02-19 LAB — BASIC METABOLIC PANEL
BUN: 16 mg/dL (ref 6–23)
Chloride: 100 mEq/L (ref 96–112)
GFR calc non Af Amer: >90 mL/min (ref >90)
Glucose, Bld: 158 mg/dL — ABNORMAL HIGH (ref 70–99)
Potassium: 3.8 mEq/L (ref 3.5–5.1)
Sodium: 135 mEq/L (ref 135–145)

## 2012-02-19 LAB — HEPARIN LEVEL (UNFRACTIONATED): Heparin Unfractionated: 0.33 IU/mL (ref 0.30–0.70)

## 2012-02-19 LAB — CBC
HCT: 39.1 % (ref 39.0–52.0)
Hemoglobin: 12.6 g/dL — ABNORMAL LOW (ref 13.0–17.0)
MCHC: 32.2 g/dL (ref 30.0–36.0)
RBC: 5.01 MIL/uL (ref 4.22–5.81)
WBC: 7.9 10*3/uL (ref 4.0–10.5)

## 2012-02-19 LAB — HEMOGLOBIN A1C: Mean Plasma Glucose: 183 mg/dL — ABNORMAL HIGH (ref <117)

## 2012-02-19 MED ORDER — LIVING WELL WITH DIABETES BOOK
Freq: Once | Status: AC
  Administered 2012-02-19: 12:00:00
  Filled 2012-02-19: qty 1

## 2012-02-19 MED ORDER — PNEUMOCOCCAL VAC POLYVALENT 25 MCG/0.5ML IJ INJ
0.5000 mL | INJECTION | INTRAMUSCULAR | Status: DC
  Filled 2012-02-19: qty 0.5

## 2012-02-19 NOTE — Consult Note (Signed)
Name: Raymond Harrell MRN: [id number] DOB: 03/27/1966    LOS: 1  Referring Provider:  Riley Kill Reason for Referral:  Pleuritic chest pain  PULMONARY / CRITICAL CARE MEDICINE  HPI:  46 yo former smoker, immigrant from [place] working in a Electronics engineer (wears respirator as needed), previously worked at nursing home, who developed left chest pain with inspiration on 9/9. The pain was worse on inspiration and described as sharp and non radiating. The pain became more intense on 9/11 and the patient presented to [facility] ED. He was noted to be hypertensive. Cardiology was consulted. Chest CT was negative for PE but did demonstrate stable scarring and bronchiectasis in the right upper lung and airspace disease  in the lung bases as well as pulmonary vascular congestion. Nonspecific nodular opacity in the upper abdomen could represent lymph node or part of the pancreas. 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  There is no fevers, weight loss, night sweats or hemoptysis.  Past Medical History  Diagnosis Date  . Chest pain     Nuc 2010 negative (EKG abnormal at that time)   Past Surgical History  Procedure Date  . Hand tendon surgery    Prior to Admission medications   Medication Sig Start Date End Date Taking? Authorizing Provider  aspirin 325 MG tablet Take 325 mg by mouth daily.   Yes Historical Provider, MD  fish oil-omega-3 fatty acids 1000 MG capsule Take 1 g by mouth daily.   Yes Historical Provider, MD   Allergies No Known Allergies  Family History Family History  Problem Relation Age of Onset  . Heart attack Father     MI in his 72's   Social History  reports that he has quit smoking(age 3 -24). He does not have any smokeless tobacco history on file. He reports that he drinks alcohol. He reports that he does not use illicit drugs.  Review Of Systems:  As  per HPI.  Otherwise negative.  Vital signs: Temp:  [97.7 F (36.5 C)-98.1 F (36.7 C)] 97.7 F (36.5 C) (09/12 1219) Pulse Rate:  [62-79] 68  (09/12 1219) Resp:  [13-25] 17  (09/12 1219) BP: (110-141)/(55-85) 141/85 mmHg (09/12 1219) SpO2:  [94 %-100 %] 96 % (09/12 1219) Weight:  [134.5 kg (296 lb 8.3 oz)] 134.5 kg (296 lb 8.3 oz) (09/12 0057) Intake/Output      09/11 0701 - 09/12 0700 09/12 0701 - 09/13 0700   I.V. (mL/kg) 436.6 (3.2)    Total Intake(mL/kg) 436.6 (3.2)    Net +436.6          Physical Examination: General:  WNWD  Neuro:  Intact HEENT:  No jvd, oral mucosa unremarkable Neck:  No JVD Cardiovascular:  Hsr rrr Lungs:  Decreased bs bases, mild crakles left base Abdomen:  Obese ++bs Musculoskeletal:  intact Skin:  warm  Labs:  Lab 02/19/12 0505 02/19/12 0045 02/18/12 1902 02/18/12 1901 02/18/12 1819 02/18/12 1635 02/18/12 1404 02/18/12 0952  HGB 12.6* -- -- -- -- 13.7 -- 14.1  WBC 7.9 -- -- -- -- 7.1 -- 6.6  PLT 174 -- -- -- -- 173 -- 169  NA 135 -- -- -- -- -- -- 136  K 3.8 -- -- -- -- -- -- 3.8  CL 100 -- -- -- -- -- -- 101  CO2 25 -- -- -- -- -- -- 21  GLUCOSE 158* -- -- -- -- -- --  137*  BUN 16 -- -- -- -- -- -- 14  CREATININE 0.97 -- -- -- -- -- -- 0.93  CALCIUM 9.2 -- -- -- -- -- -- 9.4  MG -- -- -- -- -- -- -- --  PHOS -- -- -- -- -- -- -- --  AST -- -- -- -- -- -- -- 26  ALT -- -- -- -- -- -- -- 33  ALKPHOS -- -- -- -- -- -- -- 80  BILITOT -- -- -- -- -- -- -- 0.3  PROT -- -- -- -- -- -- -- 7.7  ALBUMIN -- -- -- -- -- -- -- 3.9  INR -- -- 1.01 -- -- -- -- --  APTT -- -- -- -- -- -- -- --  INR -- -- 1.01 -- -- -- -- --  LATICACIDVEN -- -- -- -- -- -- -- --  TROPONINI -- <0.30 -- <0.30 -- -- <0.30 <0.30  PHART -- -- -- -- 7.436 -- -- --  PCO2ART -- -- -- -- 36.4 -- -- --  PO2ART -- -- -- -- 82.0 -- -- --  HCO3 -- -- -- -- 24.5* -- -- --  O2SAT -- -- -- -- 96.0 -- -- --   Imaging: Dg Chest 2 View  02/18/2012  *RADIOLOGY REPORT*   Clinical Data: Chest pain.  Ex-smoker.  CHEST - 2 VIEW  Comparison: 03/31/2005 and CT of 03/31/2005.  Findings: Numerous leads and wires project over the chest.  Mild right hemidiaphragm elevation.  The trachea patient minimally rotated right. Normal heart size.  No pleural effusion or pneumothorax.  Right apical pleural parenchymal opacity is felt to be minimally progressive since 2006.  This corresponds to bronchiectasis and architectural distortion of the right apex on prior CT.  There is also diffuse interstitial prominence which is nonspecific and slightly progressive since the prior exam.  No new lobar consolidation.  IMPRESSION:  1.  Likely minimal increase in the pleural parenchymal opacity at the right apex since 2006.  Most consistent with post infectious or inflammatory scarring/bronchiectasis. 2.  Diffuse mild interstitial thickening which could relate to the clinical history of smoking / chronic bronchitis.   Original Report Authenticated By: Consuello Bossier, M.D.    Ct Angio Chest W/cm &/or Wo Cm  02/18/2012  *RADIOLOGY REPORT*  Clinical Data: Chest pain and shortness of breath.  CT ANGIOGRAPHY CHEST  Technique:  Multidetector CT imaging of the chest using the standard protocol during bolus administration of intravenous contrast. Multiplanar reconstructed images including MIPs were obtained and reviewed to evaluate the vascular anatomy.  Contrast: OMNIPAQUE IOHEXOL 350 MG/ML SOLN  Comparison: 03/31/2005  Findings: Technically adequate study with good opacification of the central and segmental pulmonary arteries.  No focal filling defects demonstrated.  No evidence of significant pulmonary embolus.  Normal heart size.  Normal caliber thoracic aorta.  No significant lymphadenopathy in the chest.  The esophagus is decompressed. Diffuse low attenuation change in the liver consistent with fatty infiltration.  Nodule in the gastrohepatic ligament measuring 17 mm is seen only on the inferior slices.   This appears to be stable since the previous study.  This could be an enlarged lymph node or part of the pancreas.  Visualization of the lungs is limited due to respiratory motion artifact.  There is some scarring, consolidation, and bronchiectasis in the right upper lung which is stable since the previous study.  Atelectasis or infiltration is present in both lung bases, new since previous study.  There appears to be some vascular congestion.  Lower lobe opacities could represent edema.  IMPRESSION: No evidence of significant pulmonary embolus.  Stable scarring and bronchiectasis in the right upper lung. Infiltration, edema, or atelectasis in the lung bases.  Pulmonary vascular congestion. Nonspecific nodular opacity in the upper abdomen could represent lymph node or part of the pancreas.  It appears stable since previous study, suggesting benign lesion.   Original Report Authenticated By: Marlon Pel, M.D.    TTE:  9/11  Normal systolic function.  EF 60%.  Unable to evaluate diastolic function.  No regional wall motion abnormalities.  ASSESSMENT AND PLAN:  Left sided pleuritic chest pain, likely pleurisy.  PE ruled out by chest CT-angio. Right upper lobe bronchiectasis  / scarring, essentially unchanged since 2006. Bibasilar atelectasis / less likely infiltrate.  No evidence of pulmonary infection. 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  Low suspicion for active TB.  -->  Symptomatic management of pleuritic chest pain -->  Quantiferon gold TB assay (ordered) -->  Do not see indications for flexible bronchoscopy at this time -->  Chest CT in prone position may confirm bibasilar changes to be atelectasis, however I would not think this is necessary  Brett Canales Minor ACNP [facility] Pager [phone] till 3 pm If no answer page [phone] 02/19/2012, 10:20 AM  Patient examined.  Records reviewed.  Case discussed with NP.  Assessment  and plan edited as above.  Orlean Bradford, M.D., F.C.C.P. Pulmonary and Critical Care Medicine [facility] Cell: [phone] Pager: [phone]

## 2012-02-19 NOTE — Progress Notes (Signed)
Inpatient Diabetes Program Recommendations  AACE/ADA: New Consensus Statement on Inpatient Glycemic Control (2013)  Target Ranges:  Prepandial:   less than 140 mg/dL      Peak postprandial:   less than 180 mg/dL (1-2 hours)      Critically ill patients:  140 - 180 mg/dL    Inpatient Diabetes Program Recommendations Oral Agents: Start Metformin if no contraindications  Outpatient Referral: ordered Diet: Add carb modified hi  Diabetes coordinator spoke with patient concerning new onset DM.  Patient is open to going to the [facility] for OP classes.  Order entered.   Patient will need a glucometer, strips, and lancets.  A RX form was left on chart for MD to sign.  Patient has no further questions at this time. Thank you  Piedad Climes RN,BSN,CDE Inpatient Diabetes Coordinator [phone] (team pager)

## 2012-02-19 NOTE — Plan of Care (Signed)
Problem: Food- and Nutrition-Related Knowledge Deficit (NB-1.1) Goal: Nutrition education Formal process to instruct or train a patient/client in a skill or to impart knowledge to help patients/clients voluntarily manage or modify food choices and eating behavior to maintain or improve health.  Outcome: Completed/Met Date Met:  02/19/12 Knowledge deficit resolved with diet education on 9/12.  Comments:  Discussed and provided handout obtained from ADA nutrition care manual for type 2 diabetes nutrition therapy. Patient's family member present for education. Patient and family member eager to listen. I have also educated the patient on food label reading. I have encouraged the patient to check blood sugar regularly. Patient and family without any nutrition related questions or concerns and verbalized understanding of the nutrition information provided. Expect fair compliance.   RD available for nutrition needs.   Iven Finn [facility] [phone]

## 2012-02-19 NOTE — Progress Notes (Signed)
UR done. 

## 2012-02-19 NOTE — Progress Notes (Signed)
Patient Name: Raymond Harrell Date of Encounter: 02/19/2012     Active Problems:  * No active hospital problems. *     SUBJECTIVE  Patient admitted as noted.  Seen by Dr. Excell Seltzer and has evidence of chest pain thought to be pleuritic.  Echo and CT both done.  See results.  Pain remains.  Has left chest pain worse with inspiration.  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  CT suggests bronchiectasis and ? Infiltrates in both lower lobes.  Denies fever.  No ischemic sounding symptoms. Wife notes that he stops breathing at night, and sat noted last pm to be in the 80s.  CURRENT MEDS    . aspirin  325 mg Oral Daily  . heparin  1,500 Units/hr Intravenous Once  . heparin  3,000 Units Intravenous Once  . heparin  4,000 Units Intravenous Once  .  morphine injection  4 mg Intravenous Once  .  morphine injection  4 mg Intravenous Once  . nitroGLYCERIN  2-200 mcg/min Intravenous Once  . nitroGLYCERIN  5-100 mcg/min Intravenous Once  . omega-3 acid ethyl esters  1 g Oral Daily  . sodium chloride  3 mL Intravenous Q12H  . DISCONTD: fish oil-omega-3 fatty acids  1 g Oral Daily  . DISCONTD: heparin  16 Units/kg/hr (Order-Specific) Intravenous Once    OBJECTIVE  Filed Vitals:   02/19/12 0500 02/19/12 0549 02/19/12 0700 02/19/12 0813  BP: 110/72 113/67 117/71 118/76  Pulse: 68 62 71 66  Temp:  97.9 F (36.6 C)  97.7 F (36.5 C)  TempSrc:  Oral  Oral  Resp: 15 15 23 13   Height:      Weight:      SpO2: 99% 99% 100% 96%    Intake/Output Summary (Last 24 hours) at 02/19/12 0951 Last data filed at 02/19/12 0600  Gross per 24 hour  Intake 436.59 ml  Output      0 ml  Net 436.59 ml   Filed Weights   02/18/12 1008 02/19/12 0057  Weight: 279 lb 15.8 oz (127 kg) 296 lb 8.3 oz (134.5 kg)    PHYSICAL EXAM  General: Pleasant, NAD. Neuro: Alert and oriented X 3. Moves all extremities spontaneously. Psych: Normal affect. HEENT:  Normal  Neck: Supple  without bruits or JVD. Lungs: clear upper but crackles in left base.   Heart: RRR no s3, s4, or murmurs. Abdomen: Soft, non-tender, non-distended, BS + x 4.  Extremities: No clubbing, cyanosis or edema. DP/PT/Radials 2+ and equal bilaterally.  Accessory Clinical Findings  CBC  Basename 02/19/12 0505 02/18/12 1635 02/18/12 0952  WBC 7.9 7.1 --  NEUTROABS -- -- 2.5  HGB 12.6* 13.7 --  HCT 39.1 41.0 --  MCV 78.0 77.7* --  PLT 174 173 --   Basic Metabolic Panel  Basename 02/19/12 0505 02/18/12 0952  NA 135 136  K 3.8 3.8  CL 100 101  CO2 25 21  GLUCOSE 158* 137*  BUN 16 14  CREATININE 0.97 0.93  CALCIUM 9.2 9.4  MG -- --  PHOS -- --   Liver Function Tests  Basename 02/18/12 0952  AST 26  ALT 33  ALKPHOS 80  BILITOT 0.3  PROT 7.7  ALBUMIN 3.9    Basename 02/18/12 1901  LIPASE 27  AMYLASE --   Cardiac Enzymes  Basename 02/19/12 0045 02/18/12 1901 02/18/12 1404  CKTOTAL -- -- --  CKMB -- -- --  CKMBINDEX -- -- --  TROPONINI <0.30 <0.30 <0.30   BNP No components found with this basename: POCBNP:3 D-Dimer  Basename 02/18/12 1155  DDIMER <0.27   Hemoglobin A1C  Basename 02/18/12 1902  HGBA1C 8.0*   Fasting Lipid Panel  Basename 02/19/12 0505  CHOL 242*  HDL 71  LDLCALC 140*  TRIG 156*  CHOLHDL 3.4  LDLDIRECT --   Thyroid Function Tests  Basename 02/18/12 1902  TSH 7.774*  T4TOTAL --  T3FREE --  THYROIDAB --    TELE  NSR. PACs on telemetry.   ECG  NSR.  TWI in lateral leads.  Was noted in 2010 in review, and less prominent than yesterday's tracing.    CT SCAN  IMPRESSION:  No evidence of significant pulmonary embolus. Stable scarring and  bronchiectasis in the right upper lung. Infiltration, edema, or  atelectasis in the lung bases. Pulmonary vascular congestion.  Nonspecific nodular opacity in the upper abdomen could represent  lymph node or part of the pancreas. It appears stable since  previous study, suggesting benign  lesion.  2D Echo  Study Conclusions  - Left ventricle: The cavity size was at the upper limits of normal. Wall thickness was increased increased in a pattern of mild to moderate LVH. Systolic function was normal. The estimated ejection fraction was in the range of 55% to 60%. Wall motion was normal; there were no regional wall motion abnormalities. The study is not technically sufficient to allow evaluation of LV diastolic function. - Left atrium: The atrium was mildly dilated        Radiology/Studies  Dg Chest 2 View  02/18/2012  *RADIOLOGY REPORT*  Clinical Data: Chest pain.  Ex-smoker.  CHEST - 2 VIEW  Comparison: 03/31/2005 and CT of 03/31/2005.  Findings: Numerous leads and wires project over the chest.  Mild right hemidiaphragm elevation.  The trachea patient minimally rotated right. Normal heart size.  No pleural effusion or pneumothorax.  Right apical pleural parenchymal opacity is felt to be minimally progressive since 2006.  This corresponds to bronchiectasis and architectural distortion of the right apex on prior CT.  There is also diffuse interstitial prominence which is nonspecific and slightly progressive since the prior exam.  No new lobar consolidation.  IMPRESSION:  1.  Likely minimal increase in the pleural parenchymal opacity at the right apex since 2006.  Most consistent with post infectious or inflammatory scarring/bronchiectasis. 2.  Diffuse mild interstitial thickening which could relate to the clinical history of smoking / chronic bronchitis.   Original Report Authenticated By: Consuello Bossier, M.D.    Ct Angio Chest W/cm &/or Wo Cm  02/18/2012  *RADIOLOGY REPORT*  Clinical Data: Chest pain and shortness of breath.  CT ANGIOGRAPHY CHEST  Technique:  Multidetector CT imaging of the chest using the standard protocol during bolus administration of intravenous contrast. Multiplanar reconstructed images including MIPs were obtained and reviewed to evaluate the vascular  anatomy.  Contrast: OMNIPAQUE IOHEXOL 350 MG/ML SOLN  Comparison: 03/31/2005  Findings: Technically adequate study with good opacification of the central and segmental pulmonary arteries.  No focal filling defects demonstrated.  No evidence of significant pulmonary embolus.  Normal heart size.  Normal caliber thoracic aorta.  No significant lymphadenopathy in the chest.  The esophagus is decompressed. Diffuse low attenuation change in the liver consistent with fatty infiltration.  Nodule in the gastrohepatic ligament measuring 17 mm is seen only on the inferior slices.  This appears to be stable since the previous study.  This could be an enlarged lymph  node or part of the pancreas.  Visualization of the lungs is limited due to respiratory motion artifact.  There is some scarring, consolidation, and bronchiectasis in the right upper lung which is stable since the previous study.  Atelectasis or infiltration is present in both lung bases, new since previous study.  There appears to be some vascular congestion.  Lower lobe opacities could represent edema.  IMPRESSION: No evidence of significant pulmonary embolus.  Stable scarring and bronchiectasis in the right upper lung. Infiltration, edema, or atelectasis in the lung bases.  Pulmonary vascular congestion. Nonspecific nodular opacity in the upper abdomen could represent lymph node or part of the pancreas.  It appears stable since previous study, suggesting benign lesion.   Original Report Authenticated By: Marlon Pel, M.D.     ASSESSMENT AND PLAN 1.  Chest pain, pleuritic in nature 2.  Abnormal CT scan with bronchiectasis, ?infiltrate 3.  History of Pos PPD----per history 4.  ?new diagnosis of DM 5.  Hypothyroidism 6.  Possible sleep apnea. 7.  Abnormal ECG   LVH by echo, long standing.   Recommendation  1.  Pulmonary consult  2.  Recheck thyroid function  3.  DC NTG and heparin.   4.  Ambulate.  2.  DC NTG and  heparin.    Signed, Shawnie Pons MD, [facility], FSCAI

## 2012-02-19 NOTE — Progress Notes (Signed)
ANTICOAGULATION CONSULT NOTE - Follow Up Consult  Pharmacy Consult for Heparin Indication: chest pain/ACS  No Known Allergies  Patient Measurements: Height: 6\' 2"  (188 cm) Weight: 296 lb 8.3 oz (134.5 kg) IBW/kg (Calculated) : 82.2   Vital Signs: Temp: 98.1 F (36.7 C) (09/12 0057) Temp src: Oral (09/12 0057) BP: 119/68 mmHg (09/12 0057) Pulse Rate: 79  (09/12 0057)  Labs:  Basename 02/19/12 0045 02/18/12 1902 02/18/12 1901 02/18/12 1635 02/18/12 1404 02/18/12 0952  HGB -- -- -- 13.7 -- 14.1  HCT -- -- -- 41.0 -- 42.2  PLT -- -- -- 173 -- 169  APTT -- -- -- -- -- --  LABPROT -- 13.5 -- -- -- --  INR -- 1.01 -- -- -- --  HEPARINUNFRC 0.33 -- -- 0.11* -- --  CREATININE -- -- -- -- -- 0.93  CKTOTAL -- -- -- -- -- --  CKMB -- -- -- -- -- --  TROPONINI -- -- <0.30 -- <0.30 <0.30    Estimated Creatinine Clearance: 144.7 ml/min (by C-G formula based on Cr of 0.93).  Assessment: 46 yo male with chest pain for Heparin  Goal of Therapy:  Heparin level 0.3-0.7 units/ml Monitor platelets by anticoagulation protocol: Yes   Plan:  Continue Heparin at current rate  Follow-up am labs.   Eddie Candle 02/19/2012,1:42 AM

## 2012-02-20 ENCOUNTER — Encounter (HOSPITAL_COMMUNITY): Payer: Self-pay | Admitting: Physician Assistant

## 2012-02-20 MED ORDER — METFORMIN HCL 500 MG PO TABS: 500.0000 mg | ORAL_TABLET | Freq: Two times a day (BID) | ORAL | Status: DC

## 2012-02-20 NOTE — Discharge Summary (Signed)
Discharge Summary   Patient ID: Raymond Harrell MRN: [id number], DOB/AGE: 46-Sep-1967 46 y.o. Admit date: 02/18/2012 D/C date:     02/20/2012  Primary Cardiologist: Seen by Dr. Excell Seltzer this admission Primary Care Doctor: Dr. Perrin Maltese  Primary Discharge Diagnoses:  1. Pleuritic chest pain - negative cardiac enzymes - CTA negative for PE but did show bronchiectasis/scarring RUL - pulm recommended TB test which is pending but did not feel formal pulm f/u was needed 2. Newly diagnosed diabetes mellitus 3. Abnormal TSH - but repeat TSH/free T4 WNL 4. Borderline elevated blood pressure 5. Hyperlipidemia - treatment deferred to PCP 6. Nodular opacity in upper abdomen felt lymph node vs part of pancreas, felt to be benign lesion  Hospital Course: Raymond Harrell is a 46 y/o M who denies any prior cardiac hx but with ?glucose today, ?BP recently, former tobacco abuse who had a stress test for chest pain in 2010 that was normal in the setting of abnormal EKG. He presented to [facility] on 9/11 with 2 day history of intermittent L sided sharp, stabbing chest radiating to his back with associated SOB, worse with inspiration. He also has had possible diaphoresis but with no particular relationship to the chest pain. Ibuprofen at home did not help. He had an episode of nausea/vomiting x1 without hematemesis on day of admission but was able to go on with this usual morning routine. While at work he noted increasing chest discomfort so he was sent to the ER. EKG was abnormal with NSR PACs with TWI I, II, avL, V4-V6 with slight coved ST elevation V4 as well as J pt elevation V2-V3 -- although this appeared similar to 2010 tracing. It was not felt to represent pericarditis. He denied any recent illness, cough, fever, chills, congestion, LEE, orthopnea, recent travel/surgery/bedrest, personal or family hx of blood clots.   He received 324mg  of aspirin & also got SL NTG en route which did not change the  pain. In the ER, he received morphine, heparin, and NTG gtt. He was not hypoxic or tachycardic, but appeared acutely uncomfortable, diaphoretic, and somewhat tachypnic. He was thus admitted to the hospital for further eval. Vitals were stable. CXR showed increase in R apical pleural parenchymal opacity most consistent with post infectious or inflammatory scarring/bronchiectasis, diffuse mild interstitial thickening which could relate to the clinical history of smoking. A CT angio was obtained for concern for PE which demonstrated no evidence of significant pulmonary embolus and showed stable scarring and bronchiectasis in the right upper lung with infiltration, edema, or atelectasis in the lung bases, pulmonary vascular congestion and nonspecific nodular opacity in the upper abdomen that could represent lymph node or part of the pancreas. This appeared stable since previous study, suggesting benign lesion. Stat 2D echocardiogram was obtained demonstrating mild-moderate LVH but normal LV, no regional wall motion abnormalities, no pericardial effusion. Due to ongoing discomfort, dilaudid was tried which reduced but did not eliminate his pain. IV Toradol did relieve his pain enough for him to go to sleep. Labwork this admission was notable for new onset diabetes with a1c of 8.0, normal LFTs, CE's negative x 3, initially elevated TSH but with f/u TSH/free T4 that was normal.  Pulmonary was consulted yesterday for question of lung etiology. 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 He denied weight loss, night sweats or hemoptysis. There was low suspicion for active TB but quantiferon TB testing was performed which  is pending at this time. Dr. Marin Shutter recommended symptomatic rx of pleuritic chest pain and did not see any indication for flexible bronchoscopy at this time. He recommended f/u with PCP with PRN pulm follow-up. The patient was also seen by  the diabetes educator this admission given his new diagnosis. He will be discharged with a glucometer, lancets, and test strips. Of note, his LDL was 140 this admission and we will defer further management of this including diabetes to his primary care provider. He will be started on Metformin 500mg  BID and instructed to f/u closely at [facility] with Dr. Perrin Maltese. Note that they do not schedule appts for post-hospital followups, so Mr. Tom-Johnson was instructed to contact [facility] to obtain Dr. Ernestene Mention most current urgent care schedule. Dr. Riley Kill has seen and examined him today and feels he is stable for discharge.   Discharge Vitals: Blood pressure 137/72, pulse 66, temperature 98.1 F (36.7 C), temperature source Oral, resp. rate 15, height 6\' 2"  (1.88 m), weight 296 lb 8.3 oz (134.5 kg), SpO2 97.00%.  Labs: Lab Results  Component Value Date   WBC 7.9 02/19/2012   HGB 12.6* 02/19/2012   HCT 39.1 02/19/2012   MCV 78.0 02/19/2012   PLT 174 02/19/2012     Lab 02/19/12 0505 02/18/12 0952  NA 135 --  K 3.8 --  CL 100 --  CO2 25 --  BUN 16 --  CREATININE 0.97 --  CALCIUM 9.2 --  PROT -- 7.7  BILITOT -- 0.3  ALKPHOS -- 80  ALT -- 33  AST -- 26  GLUCOSE 158* --    Basename 02/19/12 0045 02/18/12 1901 02/18/12 1404 02/18/12 0952  CKTOTAL -- -- -- --  CKMB -- -- -- --  TROPONINI <0.30 <0.30 <0.30 <0.30   Lab Results  Component Value Date   CHOL 242* 02/19/2012   HDL 71 02/19/2012   LDLCALC 140* 02/19/2012   TRIG 156* 02/19/2012   Lab Results  Component Value Date   DDIMER <0.27 02/18/2012    Diagnostic Studies/Procedures   1. Dg Chest 2 View 02/18/2012  *RADIOLOGY REPORT*  Clinical Data: Chest pain.  Ex-smoker.  CHEST - 2 VIEW  Comparison: 03/31/2005 and CT of 03/31/2005.  Findings: Numerous leads and wires project over the chest.  Mild right hemidiaphragm elevation.  The trachea patient minimally rotated right. Normal heart size.  No pleural effusion or pneumothorax.   Right apical pleural parenchymal opacity is felt to be minimally progressive since 2006.  This corresponds to bronchiectasis and architectural distortion of the right apex on prior CT.  There is also diffuse interstitial prominence which is nonspecific and slightly progressive since the prior exam.  No new lobar consolidation.  IMPRESSION:  1.  Likely minimal increase in the pleural parenchymal opacity at the right apex since 2006.  Most consistent with post infectious or inflammatory scarring/bronchiectasis. 2.  Diffuse mild interstitial thickening which could relate to the clinical history of smoking / chronic bronchitis.   Original Report Authenticated By: Consuello Bossier, M.D.    2. Ct Angio Chest W/cm &/or Wo Cm9/04/2012  *RADIOLOGY REPORT*  Clinical Data: Chest pain and shortness of breath.  CT ANGIOGRAPHY CHEST  Technique:  Multidetector CT imaging of the chest using the standard protocol during bolus administration of intravenous contrast. Multiplanar reconstructed images including MIPs were obtained and reviewed to evaluate the vascular anatomy.  Contrast: OMNIPAQUE IOHEXOL 350 MG/ML SOLN  Comparison: 03/31/2005  Findings: Technically adequate study with good  opacification of the central and segmental pulmonary arteries.  No focal filling defects demonstrated.  No evidence of significant pulmonary embolus.  Normal heart size.  Normal caliber thoracic aorta.  No significant lymphadenopathy in the chest.  The esophagus is decompressed. Diffuse low attenuation change in the liver consistent with fatty infiltration.  Nodule in the gastrohepatic ligament measuring 17 mm is seen only on the inferior slices.  This appears to be stable since the previous study.  This could be an enlarged lymph node or part of the pancreas.  Visualization of the lungs is limited due to respiratory motion artifact.  There is some scarring, consolidation, and bronchiectasis in the right upper lung which is stable since the  previous study.  Atelectasis or infiltration is present in both lung bases, new since previous study.  There appears to be some vascular congestion.  Lower lobe opacities could represent edema.  IMPRESSION: No evidence of significant pulmonary embolus.  Stable scarring and bronchiectasis in the right upper lung. Infiltration, edema, or atelectasis in the lung bases.  Pulmonary vascular congestion. Nonspecific nodular opacity in the upper abdomen could represent lymph node or part of the pancreas.  It appears stable since previous study, suggesting benign lesion.   Original Report Authenticated By: Marlon Pel, M.D.    3. 2D Echo Study Conclusions - Left ventricle: The cavity size was at the upper limits of normal. Wall thickness was increased increased in a pattern of mild to moderate LVH. Systolic function was normal. The estimated ejection fraction was in the range of 55% to 60%. Wall motion was normal; there were no regional wall motion abnormalities. The study is not technically sufficient to allow evaluation of LV diastolic function. - Left atrium: The atrium was mildly dilated.  Discharge Medications   Current Discharge Medication List    START taking these medications   Details  metFORMIN (GLUCOPHAGE) 500 MG tablet Take 1 tablet (500 mg total) by mouth 2 (two) times daily with a meal. Qty: 60 tablet, Refills: 1      CONTINUE these medications which have NOT CHANGED   Details  aspirin 325 MG tablet Take 325 mg by mouth daily.    fish oil-omega-3 fatty acids 1000 MG capsule Take 1 g by mouth daily.        Disposition   The patient will be discharged in stable condition to home. Discharge Orders    Future Orders Please Complete By Expires   Ambulatory referral to Nutrition and Diabetic Education      Comments:   New onset A1C=8   Diet - low sodium heart healthy      Increase activity slowly      Discharge instructions      Comments:   Check your blood sugar once per  day and keep a log to take to your primary care doctor.     Follow-up Information    Call GUEST, Loretha Stapler, MD. (This office does not schedule appts for hospital follow-ups, so please call his office to find out when he will have hours seeing patients in the urgent care center. You will need to be seen in 1 week.)    Contact information:   [address] [place] [address] [phone]       Follow up with [facility]. (As needed)    Contact information:   [address] [place] [address] [phone]      Follow up with [facility]. (As needed)  Contact information:   [address] [place] [address]            Duration of Discharge Encounter: Greater than 30 minutes including physician and PA time.  Signed, Dayna Dunn PA-C 02/20/2012, 11:06 AM

## 2012-02-20 NOTE — Progress Notes (Signed)
Subjective:  Feeling much better.  Case discussed with Dr. Craige Cotta.  Patient will need early follow up with Dr. Perrin Maltese at [address].  Seen by DM coordinator and will need prescriptions as noted.    Objective:  Vital Signs in the last 24 hours: Temp:  [97.7 F (36.5 C)-98.3 F (36.8 C)] 98.1 F (36.7 C) (09/13 0800) Pulse Rate:  [66-92] 66  (09/13 0400) Resp:  [14-24] 15  (09/13 0400) BP: (123-155)/(53-86) 137/72 mmHg (09/13 0400) SpO2:  [95 %-98 %] 97 % (09/13 0400)  Intake/Output from previous day: 09/12 0701 - 09/13 0700 In: 158 [P.O.:60; I.V.:98] Out: -    Physical Exam: General: Well developed, well nourished, in no acute distress. Head:  Normocephalic and atraumatic. Lungs: Clear to auscultation and percussion. Heart: Normal S1 and S2.  No murmur, rubs or gallops.  Pulses: Pulses normal in all 4 extremities. Extremities: No clubbing or cyanosis. No edema. Neurologic: Alert and oriented x 3.    Lab Results:  Basename 02/19/12 0505 02/18/12 1635  WBC 7.9 7.1  HGB 12.6* 13.7  PLT 174 173    Basename 02/19/12 0505 02/18/12 0952  NA 135 136  K 3.8 3.8  CL 100 101  CO2 25 21  GLUCOSE 158* 137*  BUN 16 14  CREATININE 0.97 0.93    Basename 02/19/12 0045 02/18/12 1901  TROPONINI <0.30 <0.30   Hepatic Function Panel  Basename 02/18/12 0952  PROT 7.7  ALBUMIN 3.9  AST 26  ALT 33  ALKPHOS 80  BILITOT 0.3  BILIDIR --  IBILI --    Basename 02/19/12 0505  CHOL 242*   No results found for this basename: PROTIME in the last 72 hours  Imaging: Dg Chest 2 View  02/18/2012  *RADIOLOGY REPORT*  Clinical Data: Chest pain.  Ex-smoker.  CHEST - 2 VIEW  Comparison: 03/31/2005 and CT of 03/31/2005.  Findings: Numerous leads and wires project over the chest.  Mild right hemidiaphragm elevation.  The trachea patient minimally rotated right. Normal heart size.  No pleural effusion or pneumothorax.  Right apical pleural parenchymal opacity is felt to be minimally  progressive since 2006.  This corresponds to bronchiectasis and architectural distortion of the right apex on prior CT.  There is also diffuse interstitial prominence which is nonspecific and slightly progressive since the prior exam.  No new lobar consolidation.  IMPRESSION:  1.  Likely minimal increase in the pleural parenchymal opacity at the right apex since 2006.  Most consistent with post infectious or inflammatory scarring/bronchiectasis. 2.  Diffuse mild interstitial thickening which could relate to the clinical history of smoking / chronic bronchitis.   Original Report Authenticated By: Consuello Bossier, M.D.    Ct Angio Chest W/cm &/or Wo Cm  02/18/2012  *RADIOLOGY REPORT*  Clinical Data: Chest pain and shortness of breath.  CT ANGIOGRAPHY CHEST  Technique:  Multidetector CT imaging of the chest using the standard protocol during bolus administration of intravenous contrast. Multiplanar reconstructed images including MIPs were obtained and reviewed to evaluate the vascular anatomy.  Contrast: OMNIPAQUE IOHEXOL 350 MG/ML SOLN  Comparison: 03/31/2005  Findings: Technically adequate study with good opacification of the central and segmental pulmonary arteries.  No focal filling defects demonstrated.  No evidence of significant pulmonary embolus.  Normal heart size.  Normal caliber thoracic aorta.  No significant lymphadenopathy in the chest.  The esophagus is decompressed. Diffuse low attenuation change in the liver consistent with fatty infiltration.  Nodule in the gastrohepatic ligament measuring  17 mm is seen only on the inferior slices.  This appears to be stable since the previous study.  This could be an enlarged lymph node or part of the pancreas.  Visualization of the lungs is limited due to respiratory motion artifact.  There is some scarring, consolidation, and bronchiectasis in the right upper lung which is stable since the previous study.  Atelectasis or infiltration is present in both lung  bases, new since previous study.  There appears to be some vascular congestion.  Lower lobe opacities could represent edema.  IMPRESSION: No evidence of significant pulmonary embolus.  Stable scarring and bronchiectasis in the right upper lung. Infiltration, edema, or atelectasis in the lung bases.  Pulmonary vascular congestion. Nonspecific nodular opacity in the upper abdomen could represent lymph node or part of the pancreas.  It appears stable since previous study, suggesting benign lesion.   Original Report Authenticated By: Marlon Pel, M.D.       Assessment/Plan:  Patient Active Hospital Problem List: Diabetes mellitus (02/19/2012)   Assessment: see coordinator note   Plan: needs follow up in one week with Dr. Perrin Maltese Hypothyroidism (02/19/2012)   Assessment: repeat TSH normal   Plan: follow  Chest pain, pleuritic (02/19/2012)   Assessment: largely resolved   Plan: pulm does not feel he needs more follow up.    He will need close primary care follow up.  Our service can see him again as need and recommended by Dr. Perrin Maltese.        Shawnie Pons, MD, [facility], [facility] 02/20/2012, 8:58 AM

## 2012-02-20 NOTE — Progress Notes (Signed)
Raymond Harrell is a 46 y.o. male former smoker admitted on 02/18/2012 with chest pain.  He is an immigrant from [place] working in a Electronics engineer (wears respirator as needed), previously worked at nursing home, who developed left chest pain with inspiration on 9/9.  Chest CT was negative for PE but did demonstrate stable scarring and bronchiectasis in the right upper lung and airspace disease in the lung bases as well as pulmonary vascular congestion.  The patient has history of positive PPD but no TB exposure and history of BCG vaccination.  PCCM consulted 9/12 to assess pleuritic chest pain and CT chest changes.   SUBJECTIVE: Chest pain better.  Denies dyspnea, cough, sputum, or hemoptysis.  No documented fever since hospital admit.  OBJECTIVE:  Blood pressure 137/72, pulse 66, temperature 98.1 F (36.7 C), temperature source Oral, resp. rate 15, height 6\' 2"  (1.88 m), weight 296 lb 8.3 oz (134.5 kg), SpO2 97.00%. Wt Readings from Last 3 Encounters:  02/19/12 296 lb 8.3 oz (134.5 kg)  05/09/09 267 lb (121.11 kg)  05/01/09 274 lb (124.286 kg)   Body mass index is 38.07 kg/(m^2).  I/O last 3 completed shifts: In: 594.6 [P.O.:60; I.V.:534.6] Out: -    General - no distress HEENT - no sinus tenderness Cardiac - s1s1 regular Chest - no wheeze Abd - soft, nontender Ext - no edema Neuro - normal strength Psych - normal mood, behavior  Lab Results  Component Value Date   WBC 7.9 02/19/2012   HGB 12.6* 02/19/2012   HCT 39.1 02/19/2012   MCV 78.0 02/19/2012   PLT 174 02/19/2012   Lab Results  Component Value Date   CREATININE 0.97 02/19/2012   BUN 16 02/19/2012   NA 135 02/19/2012   K 3.8 02/19/2012   CL 100 02/19/2012   CO2 25 02/19/2012   Lab Results  Component Value Date   ALT 33 02/18/2012   AST 26 02/18/2012   ALKPHOS 80 02/18/2012   BILITOT 0.3 02/18/2012    Dg Chest 2 View  02/18/2012  *RADIOLOGY REPORT*  Clinical Data: Chest pain.  Ex-smoker.  CHEST - 2 VIEW   Comparison: 03/31/2005 and CT of 03/31/2005.  Findings: Numerous leads and wires project over the chest.  Mild right hemidiaphragm elevation.  The trachea patient minimally rotated right. Normal heart size.  No pleural effusion or pneumothorax.  Right apical pleural parenchymal opacity is felt to be minimally progressive since 2006.  This corresponds to bronchiectasis and architectural distortion of the right apex on prior CT.  There is also diffuse interstitial prominence which is nonspecific and slightly progressive since the prior exam.  No new lobar consolidation.  IMPRESSION:  1.  Likely minimal increase in the pleural parenchymal opacity at the right apex since 2006.  Most consistent with post infectious or inflammatory scarring/bronchiectasis. 2.  Diffuse mild interstitial thickening which could relate to the clinical history of smoking / chronic bronchitis.   Original Report Authenticated By: Consuello Bossier, M.D.    Ct Angio Chest W/cm &/or Wo Cm  02/18/2012  *RADIOLOGY REPORT*  Clinical Data: Chest pain and shortness of breath.  CT ANGIOGRAPHY CHEST  Technique:  Multidetector CT imaging of the chest using the standard protocol during bolus administration of intravenous contrast. Multiplanar reconstructed images including MIPs were obtained and reviewed to evaluate the vascular anatomy.  Contrast: OMNIPAQUE IOHEXOL 350 MG/ML SOLN  Comparison: 03/31/2005  Findings: Technically adequate study with good opacification of the central and segmental pulmonary arteries.  No focal filling defects demonstrated.  No evidence of significant pulmonary embolus.  Normal heart size.  Normal caliber thoracic aorta.  No significant lymphadenopathy in the chest.  The esophagus is decompressed. Diffuse low attenuation change in the liver consistent with fatty infiltration.  Nodule in the gastrohepatic ligament measuring 17 mm is seen only on the inferior slices.  This appears to be stable since the previous study.   This could be an enlarged lymph node or part of the pancreas.  Visualization of the lungs is limited due to respiratory motion artifact.  There is some scarring, consolidation, and bronchiectasis in the right upper lung which is stable since the previous study.  Atelectasis or infiltration is present in both lung bases, new since previous study.  There appears to be some vascular congestion.  Lower lobe opacities could represent edema.  IMPRESSION: No evidence of significant pulmonary embolus.  Stable scarring and bronchiectasis in the right upper lung. Infiltration, edema, or atelectasis in the lung bases.  Pulmonary vascular congestion. Nonspecific nodular opacity in the upper abdomen could represent lymph node or part of the pancreas.  It appears stable since previous study, suggesting benign lesion.   Original Report Authenticated By: Marlon Pel, M.D.    Echo 02/18/12>>mild/mod LVH, EF 55 to 60%  ASSESSMENT:  Left sided pleuritic chest pain, likely pleurisy. PE ruled out by chest CT-angio.  Right upper lobe bronchiectasis / scarring, essentially unchanged since 2006.  Bibasilar atelectasis / less likely infiltrate. No evidence of pulmonary infection.  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 Low suspicion for active TB  PLAN: Symptomatic management of pain>>improved 9/13 F/u Quantiferon gold TB assay>>this would only indicate previous TB exposure, but give no information about active TB (don't think he has active TB) Okay for d/c home from pulmonary standpoint>>can f/u with PCP as outpt and return to pulmonary only if needed  PCCM will sign off.  Please call if additional help needed.  Coralyn Helling, MD [facility] Pulmonary/Critical Care 02/20/2012, 8:54 AM Pager:  [phone] After 3pm call: [phone]

## 2012-02-24 ENCOUNTER — Other Ambulatory Visit: Payer: Self-pay

## 2012-02-24 ENCOUNTER — Ambulatory Visit (INDEPENDENT_AMBULATORY_CARE_PROVIDER_SITE_OTHER): Payer: Managed Care, Other (non HMO) | Admitting: Internal Medicine

## 2012-02-24 ENCOUNTER — Encounter: Payer: Self-pay | Admitting: Physician Assistant

## 2012-02-24 ENCOUNTER — Telehealth: Payer: Self-pay

## 2012-02-24 VITALS — BP 134/86 | HR 65 | Temp 98.3°F | Resp 18 | Ht 75.0 in | Wt 291.2 lb

## 2012-02-24 DIAGNOSIS — G4733 Obstructive sleep apnea (adult) (pediatric): Secondary | ICD-10-CM

## 2012-02-24 DIAGNOSIS — Z5181 Encounter for therapeutic drug level monitoring: Secondary | ICD-10-CM

## 2012-02-24 DIAGNOSIS — N529 Male erectile dysfunction, unspecified: Secondary | ICD-10-CM

## 2012-02-24 DIAGNOSIS — Z23 Encounter for immunization: Secondary | ICD-10-CM

## 2012-02-24 DIAGNOSIS — E119 Type 2 diabetes mellitus without complications: Secondary | ICD-10-CM

## 2012-02-24 DIAGNOSIS — R7612 Nonspecific reaction to cell mediated immunity measurement of gamma interferon antigen response without active tuberculosis: Secondary | ICD-10-CM

## 2012-02-24 DIAGNOSIS — Z0000 Encounter for general adult medical examination without abnormal findings: Secondary | ICD-10-CM

## 2012-02-24 LAB — POCT URINALYSIS DIPSTICK
Bilirubin, UA: NEGATIVE
Blood, UA: NEGATIVE
Ketones, UA: NEGATIVE
Leukocytes, UA: NEGATIVE
Nitrite, UA: NEGATIVE
Protein, UA: 30
pH, UA: 5.5

## 2012-02-24 LAB — POCT UA - MICROSCOPIC ONLY
Bacteria, U Microscopic: NEGATIVE
Casts, Ur, LPF, POC: NEGATIVE
Crystals, Ur, HPF, POC: NEGATIVE
Epithelial cells, urine per micros: NEGATIVE
Mucus, UA: NEGATIVE
Yeast, UA: NEGATIVE

## 2012-02-24 LAB — COMPREHENSIVE METABOLIC PANEL
BUN: 13 mg/dL (ref 6–23)
CO2: 25 mEq/L (ref 19–32)
Calcium: 9.5 mg/dL (ref 8.4–10.5)
Chloride: 102 mEq/L (ref 96–112)
Creat: 1.03 mg/dL (ref 0.50–1.35)
Total Bilirubin: 0.6 mg/dL (ref 0.3–1.2)

## 2012-02-24 LAB — POCT GLYCOSYLATED HEMOGLOBIN (HGB A1C): Hemoglobin A1C: 7.4

## 2012-02-24 LAB — POCT CBC
Granulocyte percent: 46.6 %G (ref 37–80)
MCV: 82.7 fL (ref 80–97)
MID (cbc): 0.6 (ref 0–0.9)
MPV: 9.4 fL (ref 0–99.8)
POC LYMPH PERCENT: 43.1 %L (ref 10–50)
POC MID %: 40.3 %M — AB (ref 0–12)
Platelet Count, POC: 231 10*3/uL (ref 142–424)
RBC: 5.58 M/uL (ref 4.69–6.13)
RDW, POC: 15.1 %

## 2012-02-24 LAB — LIPID PANEL
Cholesterol: 242 mg/dL — ABNORMAL HIGH (ref 0–200)
HDL: 65 mg/dL (ref >39)
Triglycerides: 95 mg/dL (ref <150)

## 2012-02-24 LAB — TSH: TSH: 1.994 u[IU]/mL (ref 0.350–4.500)

## 2012-02-24 MED ORDER — METFORMIN HCL 500 MG PO TABS: 500.0000 mg | ORAL_TABLET | Freq: Two times a day (BID) | ORAL | Status: DC

## 2012-02-24 NOTE — Telephone Encounter (Signed)
Pt CB and I explained his pos results on Quantiferon gold assay test and need to refer him to [facility]. Pt stated that he had already been notified of this from an MD in the hospital and also a nurse and he verbalized understanding of results and agreed to referral.

## 2012-02-24 NOTE — Progress Notes (Signed)
Subjective:    Patient ID: Raymond Harrell, male    DOB: 04/14/1966, 46 y.o.   MRN: [id number]  HPI cpe for work F/up new diabetes, on metformin from hospital. Was admitted for chest pain and had all cardiac tests including cath. Feels good today. Has OSA not using mask, missed appt.    See scanned hx  Review of Systems See scanned ros    Objective:   Physical Exam  Constitutional: He is oriented to person, place, and time. He appears well-developed and well-nourished. No distress.  HENT:  Right Ear: External ear normal.  Left Ear: External ear normal.  Nose: Nose normal.  Mouth/Throat: Oropharynx is clear and moist.  Eyes: EOM are normal.  Neck: Normal range of motion. No tracheal deviation present. No thyromegaly present.  Cardiovascular: Normal rate, regular rhythm and normal heart sounds.   Pulmonary/Chest: Effort normal and breath sounds normal.  Abdominal: Soft. Bowel sounds are normal.  Genitourinary: Rectum normal, prostate normal and penis normal.  Musculoskeletal: Normal range of motion.  Lymphadenopathy:    He has no cervical adenopathy.  Neurological: He is alert and oriented to person, place, and time. He has normal reflexes. No cranial nerve deficit. He exhibits normal muscle tone. Coordination normal.  Skin: Skin is dry.  Psychiatric: He has a normal mood and affect. His behavior is normal. Judgment and thought content normal.     Results for orders placed in visit on 02/24/12  POCT CBC      Component Value Range   WBC 5.9  4.6 - 10.2 K/uL   Lymph, poc 2.5  0.6 - 3.4   POC LYMPH PERCENT 43.1  10 - 50 %L   MID (cbc) 0.6  0 - 0.9   POC MID % 40.3 (*) 0 - 12 %M   POC Granulocyte 2.7  2 - 6.9   Granulocyte percent 46.6  37 - 80 %G   RBC 5.58  4.69 - 6.13 M/uL   Hemoglobin 13.8 (*) 14.1 - 18.1 g/dL   HCT, POC 40.9  81.1 - 53.7 %   MCV 82.7  80 - 97 fL   MCH, POC 24.7 (*) 27 - 31.2 pg   MCHC 29.9 (*) 31.8 - 35.4 g/dL   RDW, POC 91.4     Platelet  Count, POC 231  142 - 424 K/uL   MPV 9.4  0 - 99.8 fL  GLUCOSE, POCT (MANUAL RESULT ENTRY)      Component Value Range   POC Glucose 113 (*) 70 - 99 mg/dl  POCT GLYCOSYLATED HEMOGLOBIN (HGB A1C)      Component Value Range   Hemoglobin A1C 7.4    POCT URINALYSIS DIPSTICK      Component Value Range   Color, UA yellow     Clarity, UA clear     Glucose, UA neg     Bilirubin, UA neg     Ketones, UA neg     Spec Grav, UA 1.025     Blood, UA neg     pH, UA 5.5     Protein, UA 30     Urobilinogen, UA 0.2     Nitrite, UA neg     Leukocytes, UA Negative    POCT UA - MICROSCOPIC ONLY      Component Value Range   WBC, Ur, HPF, POC 0-1     RBC, urine, microscopic neg     Bacteria, U Microscopic neg     Mucus, UA neg  Epithelial cells, urine per micros neg     Crystals, Ur, HPF, POC neg     Casts, Ur, LPF, POC neg     Yeast, UA neg          Assessment & Plan:  New NIDDM Incrase metformin to 500mg  bid RF to [facility]

## 2012-02-24 NOTE — Progress Notes (Signed)
Pt was discharged from [facility] last week for pleuritic CP at which time TB quantiferon test was pending. It resulted last night and was reviewed today by myself and Dr. Excell Seltzer. I routed the result to Dr. Craige Cotta who noted the Quantiferon gold assay only indicates that he had prior exposure to TB, but does not indicate active TB. Dr. Craige Cotta did not think anything specifically needed to be done to assess for active TB but that he may need therapy for latent TB which can be assessed by his PCP. Dr. Craige Cotta did not think he needed to f/u with pulmonology. A copy of his reply to our original message was forwarded to Triage at [facility] as Raymond Harrell is a patient of Dr. Perrin Maltese and actually just saw him earlier today. They are also aware the patient will need to be informed of this lab abnormality as well as further plan for f/u. Arli Bree PA-C

## 2012-02-26 NOTE — Discharge Summary (Signed)
Please see my note.  He should have follow up with Dr.Guest.

## 2012-07-30 ENCOUNTER — Other Ambulatory Visit: Payer: Self-pay | Admitting: Infectious Diseases

## 2012-07-30 ENCOUNTER — Ambulatory Visit
Admission: RE | Admit: 2012-07-30 | Discharge: 2012-07-30 | Disposition: A | Discharge status: Home / Self Care | Payer: No Typology Code available for payment source | Source: Ambulatory Visit | Attending: Infectious Diseases | Admitting: Infectious Diseases

## 2012-07-30 DIAGNOSIS — Z0389 Encounter for observation for other suspected diseases and conditions ruled out: Secondary | ICD-10-CM

## 2012-12-27 ENCOUNTER — Emergency Department (HOSPITAL_COMMUNITY)
Admission: EM | Admit: 2012-12-27 | Discharge: 2012-12-27 | Disposition: A | Discharge status: Home / Self Care | Payer: Managed Care, Other (non HMO) | Attending: Emergency Medicine | Admitting: Emergency Medicine

## 2012-12-27 ENCOUNTER — Encounter (HOSPITAL_COMMUNITY): Payer: Self-pay | Admitting: *Deleted

## 2012-12-27 DIAGNOSIS — S0003XA Contusion of scalp, initial encounter: Secondary | ICD-10-CM | POA: Insufficient documentation

## 2012-12-27 DIAGNOSIS — E7800 Pure hypercholesterolemia, unspecified: Secondary | ICD-10-CM | POA: Insufficient documentation

## 2012-12-27 DIAGNOSIS — T148XXA Other injury of unspecified body region, initial encounter: Secondary | ICD-10-CM

## 2012-12-27 DIAGNOSIS — W2203XA Walked into furniture, initial encounter: Secondary | ICD-10-CM | POA: Insufficient documentation

## 2012-12-27 DIAGNOSIS — E119 Type 2 diabetes mellitus without complications: Secondary | ICD-10-CM | POA: Insufficient documentation

## 2012-12-27 DIAGNOSIS — H532 Diplopia: Secondary | ICD-10-CM | POA: Insufficient documentation

## 2012-12-27 DIAGNOSIS — Z87891 Personal history of nicotine dependence: Secondary | ICD-10-CM | POA: Insufficient documentation

## 2012-12-27 DIAGNOSIS — Y92009 Unspecified place in unspecified non-institutional (private) residence as the place of occurrence of the external cause: Secondary | ICD-10-CM | POA: Insufficient documentation

## 2012-12-27 DIAGNOSIS — Y939 Activity, unspecified: Secondary | ICD-10-CM | POA: Insufficient documentation

## 2012-12-27 DIAGNOSIS — Z79899 Other long term (current) drug therapy: Secondary | ICD-10-CM | POA: Insufficient documentation

## 2012-12-27 DIAGNOSIS — Z7982 Long term (current) use of aspirin: Secondary | ICD-10-CM | POA: Insufficient documentation

## 2012-12-27 DIAGNOSIS — R42 Dizziness and giddiness: Secondary | ICD-10-CM | POA: Insufficient documentation

## 2012-12-27 NOTE — ED Provider Notes (Signed)
History    CSN: [id number] Arrival date & time 12/27/12  0419  First MD Initiated Contact with Patient 12/27/12 [phone]     Chief Complaint  Patient presents with  . Dizziness   (Consider location/radiation/quality/duration/timing/severity/associated sxs/prior Treatment) HPI Comments: 47 year old male presents to the emergency department complaining of dizziness when he woke up this morning after hitting the left side of his head on the kitchen cabinet yesterday afternoon. He did not lose consciousness. After he hit the left side of his face he immediately took 2 ibuprofen, however still developed a throbbing pain and pressure. When he woke up this morning he felt a little dizzy when he stood up and began to drive to the ER. When he turns his head quickly states he has double vision out of the left eye. Denies confusion, nausea, vomiting, unsteadiness on his feet.  The history is provided by the patient.   Past Medical History  Diagnosis Date  . Chest pain     a. Negative nuc 2010 (abnormal baseline EKG). b. Adm with pleuritic CP 02/2012: negative cardiac enzymes, CTA negative for PE  but did show bronchiectasis/scarring RUL.  . Diabetes mellitus     Diagnosed 02/2012  . Elevated blood pressure     Noted 02/2012  . Elevated LDL cholesterol level     Noted 02/2012  . Abnormal CT scan     a. CT angio incidentally noted nodular opacity in upper abdomen felt lymph node vs part of pancreas, felt to be benign lesion with stable appearance.   Past Surgical History  Procedure Laterality Date  . Hand tendon surgery     Family History  Problem Relation Age of Onset  . Heart attack Father     MI in his 109's   History  Substance Use Topics  . Smoking status: Former Games developer  . Smokeless tobacco: Not on file  . Alcohol Use: 0.0 oz/week     Comment: 1 beer or 1 shot of liquor daily    Review of Systems  Constitutional: Negative for activity change.  Eyes: Positive for visual disturbance.   Musculoskeletal: Negative for gait problem.  Neurological: Positive for dizziness. Negative for syncope.  Psychiatric/Behavioral: Negative for confusion.  All other systems reviewed and are negative.    Allergies  Review of patient's allergies indicates no known allergies.  Home Medications   Current Outpatient Rx  Name  Route  Sig  Dispense  Refill  . aspirin 325 MG tablet   Oral   Take 325 mg by mouth daily.         . Chromium Picolinate 500 MCG TABS   Oral   Take 1 tablet by mouth daily.         . fish oil-omega-3 fatty acids 1000 MG capsule   Oral   Take 1 g by mouth daily.         . Ginkgo Biloba (GINKOBA PO)   Oral   Take 1 tablet by mouth daily.         . metFORMIN (GLUCOPHAGE) 500 MG tablet   Oral   Take 1 tablet (500 mg total) by mouth 2 (two) times daily with a meal.   60 tablet   1    BP 163/82  Pulse 72  Temp(Src) 98.1 F (36.7 C) (Oral)  Resp 18  SpO2 99% Physical Exam  Nursing note and vitals reviewed. Constitutional: He is oriented to person, place, and time. He appears well-developed and well-nourished. No distress.  HENT:  Head: Normocephalic and atraumatic.  Mouth/Throat: Oropharynx is clear and moist.  Eyes: Conjunctivae and EOM are normal. Pupils are equal, round, and reactive to light.  Neck: Normal range of motion. Neck supple.  Cardiovascular: Normal rate, regular rhythm and normal heart sounds.   Pulmonary/Chest: Effort normal and breath sounds normal.  Abdominal: Bowel sounds are normal.  Musculoskeletal: Normal range of motion. He exhibits no edema.  Neurological: He is alert and oriented to person, place, and time. He has normal strength. No cranial nerve deficit or sensory deficit. He displays a negative Romberg sign. Coordination and gait normal. GCS eye subscore is 4. GCS verbal subscore is 5. GCS motor subscore is 6.  Skin: Skin is warm and dry. He is not diaphoretic.  Psychiatric: He has a normal mood and affect. His  behavior is normal.    ED Course  Procedures (including critical care time) Labs Reviewed - No data to display No results found. 1. Contusion   2. Dizziness     MDM  Patient with dizziness after hitting the left side of his face cabinet yesterday. No red flags concerning patient's injury. No vomiting, confusion, unremarkable neurologic exam. Head normocephalic, atraumatic.  Close return precautions discussed. Advised rest, ice and ibuprofen. Patient states understanding of plan and is agreeable.   Trevor Mace, PA-C 12/27/12 [phone]

## 2012-12-27 NOTE — ED Notes (Signed)
Pt dc to home. Pt sts understanding to dc instructions. Pt ambulatory to exit without difficulty.  Pt denies need for w/c.  

## 2012-12-27 NOTE — ED Provider Notes (Signed)
Medical screening examination/treatment/procedure(s) were performed by non-physician practitioner and as supervising physician I was immediately available for consultation/collaboration.  Olivia Mackie, MD 12/27/12 [phone]

## 2012-12-27 NOTE — ED Notes (Signed)
Pt states that he hit left side of his head last night woke up this morning dizzy.

## 2012-12-27 NOTE — ED Notes (Signed)
Patient sitting on the side of the bed alert and oriented

## 2013-06-23 ENCOUNTER — Ambulatory Visit: Payer: Managed Care, Other (non HMO) | Admitting: Emergency Medicine

## 2013-06-23 VITALS — BP 146/80 | HR 70 | Temp 98.8°F | Resp 18 | Ht 74.0 in | Wt 246.0 lb

## 2013-06-23 DIAGNOSIS — E119 Type 2 diabetes mellitus without complications: Secondary | ICD-10-CM

## 2013-06-23 DIAGNOSIS — S139XXA Sprain of joints and ligaments of unspecified parts of neck, initial encounter: Secondary | ICD-10-CM

## 2013-06-23 LAB — GLUCOSE, POCT (MANUAL RESULT ENTRY): POC Glucose: 88 mg/dl (ref 70–99)

## 2013-06-23 LAB — POCT GLYCOSYLATED HEMOGLOBIN (HGB A1C): HEMOGLOBIN A1C: 5.4

## 2013-06-23 MED ORDER — TRAMADOL HCL 50 MG PO TABS: 50.0000 mg | ORAL_TABLET | Freq: Three times a day (TID) | ORAL | Status: DC | PRN

## 2013-06-23 MED ORDER — NAPROXEN SODIUM 550 MG PO TABS: 550.0000 mg | ORAL_TABLET | Freq: Two times a day (BID) | ORAL | Status: AC

## 2013-06-23 MED ORDER — CYCLOBENZAPRINE HCL 10 MG PO TABS: 10.0000 mg | ORAL_TABLET | Freq: Three times a day (TID) | ORAL | Status: DC | PRN

## 2013-06-23 NOTE — Patient Instructions (Signed)
Cervical Sprain A cervical sprain is an injury in the neck in which the strong, fibrous tissues (ligaments) that connect your neck bones stretch or tear. Cervical sprains can range from mild to severe. Severe cervical sprains can cause the neck vertebrae to be unstable. This can lead to damage of the spinal cord and can result in serious nervous system problems. The amount of time it takes for a cervical sprain to get better depends on the cause and extent of the injury. Most cervical sprains heal in 1 to 3 weeks. CAUSES  Severe cervical sprains may be caused by:   Contact sport injuries (such as from football, rugby, wrestling, hockey, auto racing, gymnastics, diving, martial arts, or boxing).   Motor vehicle collisions.   Whiplash injuries. This is an injury from a sudden forward-and backward whipping movement of the head and neck.  Falls.  Mild cervical sprains may be caused by:   Being in an awkward position, such as while cradling a telephone between your ear and shoulder.   Sitting in a chair that does not offer proper support.   Working at a poorly designed computer station.   Looking up or down for long periods of time.  SYMPTOMS   Pain, soreness, stiffness, or a burning sensation in the front, back, or sides of the neck. This discomfort may develop immediately after the injury or slowly, 24 hours or more after the injury.   Pain or tenderness directly in the middle of the back of the neck.   Shoulder or upper back pain.   Limited ability to move the neck.   Headache.   Dizziness.   Weakness, numbness, or tingling in the hands or arms.   Muscle spasms.   Difficulty swallowing or chewing.   Tenderness and swelling of the neck.  DIAGNOSIS  Most of the time your health care provider can diagnose a cervical sprain by taking your history and doing a physical exam. Your health care provider will ask about previous neck injuries and any known neck  problems, such as arthritis in the neck. X-rays may be taken to find out if there are any other problems, such as with the bones of the neck. Other tests, such as a CT scan or MRI, may also be needed.  TREATMENT  Treatment depends on the severity of the cervical sprain. Mild sprains can be treated with rest, keeping the neck in place (immobilization), and pain medicines. Severe cervical sprains are immediately immobilized. Further treatment is done to help with pain, muscle spasms, and other symptoms and may include:  Medicines, such as pain relievers, numbing medicines, or muscle relaxants.   Physical therapy. This may involve stretching exercises, strengthening exercises, and posture training. Exercises and improved posture can help stabilize the neck, strengthen muscles, and help stop symptoms from returning.  HOME CARE INSTRUCTIONS   Put ice on the injured area.   Put ice in a plastic bag.   Place a towel between your skin and the bag.   Leave the ice on for 15 20 minutes, 3 4 times a day.   If your injury was severe, you may have been given a cervical collar to wear. A cervical collar is a two-piece collar designed to keep your neck from moving while it heals.  Do not remove the collar unless instructed by your health care provider.  If you have long hair, keep it outside of the collar.  Ask your health care provider before making any adjustments to your collar.   Minor adjustments may be required over time to improve comfort and reduce pressure on your chin or on the back of your head.  Ifyou are allowed to remove the collar for cleaning or bathing, follow your health care provider's instructions on how to do so safely.  Keep your collar clean by wiping it with mild soap and water and drying it completely. If the collar you have been given includes removable pads, remove them every 1 2 days and hand wash them with soap and water. Allow them to air dry. They should be completely  dry before you wear them in the collar.  If you are allowed to remove the collar for cleaning and bathing, wash and dry the skin of your neck. Check your skin for irritation or sores. If you see any, tell your health care provider.  Do not drive while wearing the collar.   Only take over-the-counter or prescription medicines for pain, discomfort, or fever as directed by your health care provider.   Keep all follow-up appointments as directed by your health care provider.   Keep all physical therapy appointments as directed by your health care provider.   Make any needed adjustments to your workstation to promote good posture.   Avoid positions and activities that make your symptoms worse.   Warm up and stretch before being active to help prevent problems.  SEEK MEDICAL CARE IF:   Your pain is not controlled with medicine.   You are unable to decrease your pain medicine over time as planned.   Your activity level is not improving as expected.  SEEK IMMEDIATE MEDICAL CARE IF:   You develop any bleeding.  You develop stomach upset.  You have signs of an allergic reaction to your medicine.   Your symptoms get worse.   You develop new, unexplained symptoms.   You have numbness, tingling, weakness, or paralysis in any part of your body.  MAKE SURE YOU:   Understand these instructions.  Will watch your condition.  Will get help right away if you are not doing well or get worse. Document Released: 03/23/2007 Document Revised: 03/16/2013 Document Reviewed: 12/01/2012 [employer] Patient Information 2014 [employer].  

## 2013-06-23 NOTE — Progress Notes (Signed)
[facility] [address], [place] [address] [phone]  Date:  06/23/2013   Name:  Raymond Harrell   DOB:  02-Nov-1965   MRN:  [id number]  PCP:  Tally DueGUEST, CHRIS WARREN, MD    Chief Complaint: left arm numbness x2 weeks and itchy feet   History of Present Illness:  Raymond Harrell is a 48 y.o. very pleasant male patient who presents with the following:  NIDDM and stopped metformin 8 months ago as his sugars had been normal.  Says he has intermittent itching in the soles of his feet for months.  Sometimes forces him to pull off the road to take off his shoes and socks and scratch his feet.   Has noticed paresthesias in his left arm in an ulnar distribution associated with pain in the neck for the past two weeks.  No history of injury.  Works in a [employer].  No weakness or numbness or radiation of pain.    Patient Active Problem List   Diagnosis Date Noted  . Diabetes mellitus 02/19/2012  . Hypothyroidism 02/19/2012  . 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 Bronchiectasis 02/19/2012    Past Medical History  Diagnosis Date  . Chest pain     a. Negative nuc 2010 (abnormal baseline EKG). b. Adm with pleuritic CP 02/2012: negative cardiac enzymes, CTA negative for PE  but did show bronchiectasis/scarring RUL.  . Diabetes mellitus     Diagnosed 02/2012  . Elevated blood pressure     Noted 02/2012  . Elevated LDL cholesterol level     Noted 02/2012  . Abnormal CT scan     a. CT angio incidentally noted nodular opacity in upper abdomen felt lymph node vs part of pancreas, felt to be benign lesion with stable appearance.    Past Surgical History  Procedure Laterality Date  . Hand tendon surgery    . Fracture surgery      History  Substance Use Topics  . Smoking status: Former Games developermoker  . Smokeless tobacco: Not on file  . Alcohol Use: 0.0 oz/week     Comment: 1 beer or 1 shot of liquor daily    Family History  Problem Relation Age of Onset  . Heart  attack Father     MI in his 6650's    No Known Allergies  Medication list has been reviewed and updated.  Current Outpatient Prescriptions on File Prior to Visit  Medication Sig Dispense Refill  . aspirin 325 MG tablet Take 325 mg by mouth daily.      . fish oil-omega-3 fatty acids 1000 MG capsule Take 1 g by mouth daily.      . Chromium Picolinate 500 MCG TABS Take 1 tablet by mouth daily.      . Ginkgo Biloba (GINKOBA PO) Take 1 tablet by mouth daily.      . metFORMIN (GLUCOPHAGE) 500 MG tablet Take 1 tablet (500 mg total) by mouth 2 (two) times daily with a meal.  60 tablet  1   No current facility-administered medications on file prior to visit.    Review of Systems:  As per HPI, otherwise negative.    Physical Examination: Filed Vitals:   06/23/13 0908  BP: 146/80  Pulse: 70  Temp: 98.8 F (37.1 C)  Resp: 18   Filed Vitals:   06/23/13 0908  Height: 6\' 2"  (1.88 m)  Weight: 246 lb (111.585 kg)   Body mass index is 31.57 kg/(m^2). Ideal Body Weight:  Weight in (lb) to have BMI = 25: 194.3  GEN: WDWN, NAD, Non-toxic, A & O x 3 HEENT: Atraumatic, Normocephalic. Neck supple. No masses, No LAD. Ears and Nose: No external deformity. CV: RRR, No M/G/R. No JVD. No thrill. No extra heart sounds. PULM: CTA B, no wheezes, crackles, rhonchi. No retractions. No resp. distress. No accessory muscle use. ABD: S, NT, ND, +BS. No rebound. No HSM. EXTR: No c/c/e NEURO Normal gait.  PSYCH: Normally interactive. Conversant. Not depressed or anxious appearing.  Calm demeanor.    Assessment and Plan: Sciatic neuritis associated with acute neck strain Anaprox Flexeril Ultram Follow up in two weeks   Signed,  Phillips Odor, MD   Results for orders placed in visit on 06/23/13  POCT GLYCOSYLATED HEMOGLOBIN (HGB A1C)      Result Value Range   Hemoglobin A1C 5.4    GLUCOSE, POCT (MANUAL RESULT ENTRY)      Result Value Range   POC Glucose 88  70 - 99 mg/dl

## 2014-04-24 ENCOUNTER — Encounter: Payer: Self-pay | Admitting: Cardiovascular Disease

## 2014-08-22 ENCOUNTER — Emergency Department (HOSPITAL_COMMUNITY): Payer: Managed Care, Other (non HMO)

## 2014-08-22 ENCOUNTER — Encounter (HOSPITAL_COMMUNITY): Payer: Self-pay | Admitting: *Deleted

## 2014-08-22 ENCOUNTER — Emergency Department (HOSPITAL_COMMUNITY)
Admission: EM | Admit: 2014-08-22 | Discharge: 2014-08-22 | Disposition: A | Discharge status: Home / Self Care | Payer: Managed Care, Other (non HMO) | Attending: Emergency Medicine | Admitting: Emergency Medicine

## 2014-08-22 DIAGNOSIS — E119 Type 2 diabetes mellitus without complications: Secondary | ICD-10-CM | POA: Insufficient documentation

## 2014-08-22 DIAGNOSIS — Z87891 Personal history of nicotine dependence: Secondary | ICD-10-CM | POA: Insufficient documentation

## 2014-08-22 DIAGNOSIS — Z79899 Other long term (current) drug therapy: Secondary | ICD-10-CM | POA: Diagnosis not present

## 2014-08-22 DIAGNOSIS — M79662 Pain in left lower leg: Secondary | ICD-10-CM | POA: Diagnosis not present

## 2014-08-22 DIAGNOSIS — Z7982 Long term (current) use of aspirin: Secondary | ICD-10-CM | POA: Diagnosis not present

## 2014-08-22 DIAGNOSIS — R531 Weakness: Secondary | ICD-10-CM | POA: Insufficient documentation

## 2014-08-22 DIAGNOSIS — R079 Chest pain, unspecified: Secondary | ICD-10-CM | POA: Diagnosis present

## 2014-08-22 LAB — CBC
HEMATOCRIT: 40.7 % (ref 39.0–52.0)
HEMOGLOBIN: 13.3 g/dL (ref 13.0–17.0)
MCH: 26.2 pg (ref 26.0–34.0)
MCHC: 32.7 g/dL (ref 30.0–36.0)
MCV: 80.3 fL (ref 78.0–100.0)
Platelets: 182 10*3/uL (ref 150–400)
RBC: 5.07 MIL/uL (ref 4.22–5.81)
RDW: 14.6 % (ref 11.5–15.5)
WBC: 5.4 10*3/uL (ref 4.0–10.5)

## 2014-08-22 LAB — BASIC METABOLIC PANEL
Anion gap: 12 (ref 5–15)
BUN: 14 mg/dL (ref 6–23)
CHLORIDE: 105 mmol/L (ref 96–112)
CO2: 23 mmol/L (ref 19–32)
CREATININE: 0.9 mg/dL (ref 0.50–1.35)
Calcium: 9 mg/dL (ref 8.4–10.5)
GFR calc Af Amer: >90 mL/min (ref >90)
GFR calc non Af Amer: >90 mL/min (ref >90)
Glucose, Bld: 96 mg/dL (ref 70–99)
POTASSIUM: 3.6 mmol/L (ref 3.5–5.1)
Sodium: 140 mmol/L (ref 135–145)

## 2014-08-22 LAB — D-DIMER, QUANTITATIVE (NOT AT ARMC): D-Dimer, Quant: 0.33 ug/mL-FEU (ref 0.00–0.48)

## 2014-08-22 LAB — I-STAT TROPONIN, ED
TROPONIN I, POC: 0.01 ng/mL (ref 0.00–0.08)
Troponin i, poc: 0 ng/mL (ref 0.00–0.08)

## 2014-08-22 MED ORDER — TRAMADOL HCL 50 MG PO TABS: 50.0000 mg | ORAL_TABLET | Freq: Four times a day (QID) | ORAL | Status: DC | PRN

## 2014-08-22 NOTE — ED Provider Notes (Signed)
CSN: [id number]     Arrival date & time 08/22/14  [id number] History   First MD Initiated Contact with Patient 08/22/14 [phone]     Chief Complaint  Patient presents with  . Chest Pain     (Consider location/radiation/quality/duration/timing/severity/associated sxs/prior Treatment) HPI  Pt is a 49yo male with hx of chest pain, NIDDM, elevated BP, elevated cholesterol, presenting to ED with c/o left sided chest pain that is sharp in nature, constant since onset late last night, 4/10 at worst, associated generalized weakness.  Pt also c/o left lower leg pain that is aching and sore, 6/10 at worst, described as a "warm" sensation at times. Leg pain started 2 days ago.  States he stands on his feet a lot. Denies recent injury. Denies recent travel. No hx of DVT or PE.  Pt takes one 81mg  aspirin daily.  Denies fever, chills, cough, congestion, n/v/d.   Past Medical History  Diagnosis Date  . Chest pain     a. Negative nuc 2010 (abnormal baseline EKG). b. Adm with pleuritic CP 02/2012: negative cardiac enzymes, CTA negative for PE  but did show bronchiectasis/scarring RUL.  . Diabetes mellitus     Diagnosed 02/2012  . Elevated blood pressure     Noted 02/2012  . Elevated LDL cholesterol level     Noted 02/2012  . Abnormal CT scan     a. CT angio incidentally noted nodular opacity in upper abdomen felt lymph node vs part of pancreas, felt to be benign lesion with stable appearance.   Past Surgical History  Procedure Laterality Date  . Hand tendon surgery    . Fracture surgery     Family History  Problem Relation Age of Onset  . Heart attack Father     MI in his 5550's   History  Substance Use Topics  . Smoking status: Former Games developermoker  . Smokeless tobacco: Not on file  . Alcohol Use: 0.0 oz/week     Comment: 1 beer or 1 shot of liquor daily    Review of Systems  Constitutional: Negative for fever, chills, diaphoresis, appetite change and fatigue.  Respiratory: Negative for cough and shortness of  breath.   Cardiovascular: Positive for chest pain ( left side). Negative for palpitations and leg swelling.  Gastrointestinal: Negative for nausea, vomiting, abdominal pain and diarrhea.  Musculoskeletal: Positive for myalgias ( left lower leg pain). Negative for joint swelling and arthralgias.  Skin: Negative for color change, rash and wound.  All other systems reviewed and are negative.     Allergies  Review of patient's allergies indicates no known allergies.  Home Medications   Prior to Admission medications   Medication Sig Start Date End Date Taking? Authorizing Provider  aspirin 325 MG tablet Take 325 mg by mouth daily.   Yes Historical Provider, MD  Chromium Picolinate 500 MCG TABS Take 1 tablet by mouth daily.   Yes Historical Provider, MD  fish oil-omega-3 fatty acids 1000 MG capsule Take 1 g by mouth daily.   Yes Historical Provider, MD  ibuprofen (ADVIL,MOTRIN) 200 MG tablet Take 400-600 mg by mouth every 8 (eight) hours as needed for mild pain or moderate pain.   Yes Historical Provider, MD  Multiple Vitamins-Minerals (CENTRUM SILVER ULTRA MENS PO) Take 1 tablet by mouth daily.   Yes Historical Provider, MD  cyclobenzaprine (FLEXERIL) 10 MG tablet Take 1 tablet (10 mg total) by mouth 3 (three) times daily as needed for muscle spasms. Patient not taking: Reported on 08/22/2014 06/23/13  Carmelina Dane, MD  metFORMIN (GLUCOPHAGE) 500 MG tablet Take 1 tablet (500 mg total) by mouth 2 (two) times daily with a meal. 02/20/12 02/19/13  Dayna N Dunn, PA-C  traMADol (ULTRAM) 50 MG tablet Take 1 tablet (50 mg total) by mouth every 8 (eight) hours as needed. Patient not taking: Reported on 08/22/2014 06/23/13   Carmelina Dane, MD  traMADol (ULTRAM) 50 MG tablet Take 1 tablet (50 mg total) by mouth every 6 (six) hours as needed. 08/22/14   Junius Finner, PA-C   BP 139/81 mmHg  Pulse 76  Temp(Src) 97.7 F (36.5 C) (Oral)  Resp 17  SpO2 91% Physical Exam  Constitutional: He  appears well-developed and well-nourished.  HENT:  Head: Normocephalic and atraumatic.  Eyes: Conjunctivae are normal. No scleral icterus.  Neck: Normal range of motion.  Cardiovascular: Normal rate, regular rhythm and normal heart sounds.   Pulmonary/Chest: Effort normal and breath sounds normal. No respiratory distress. He has no wheezes. He has no rales. He exhibits no tenderness.  Abdominal: Soft. Bowel sounds are normal. He exhibits no distension and no mass. There is no tenderness. There is no rebound and no guarding.  Musculoskeletal: Normal range of motion. He exhibits tenderness. He exhibits no edema.  Left lower leg: tenderness to anterior/lateral aspect, no edema. FROM left knee and ankle.  Neurological: He is alert.  Skin: Skin is warm and dry. No rash noted. No erythema.  Left lower leg: no erythema or warmth. Skin in tact.   Nursing note and vitals reviewed.   ED Course  Procedures (including critical care time) Labs Review Labs Reviewed  CBC  BASIC METABOLIC PANEL  D-DIMER, QUANTITATIVE  I-STAT TROPOININ, ED  Rosezena Sensor, ED    Imaging Review Dg Chest 1 View  08/22/2014   CLINICAL DATA:  Chest pain and dyspnea  EXAM: CHEST  1 VIEW  COMPARISON:  07/30/2012  FINDINGS: There is unchanged apical pleural thickening and scarring in the right upper lobe. The lungs are otherwise clear. Hilar, mediastinal and cardiac contours appear unremarkable. There are no large effusions.  IMPRESSION: No active disease.   Electronically Signed   By: Ellery Plunk M.D.   On: 08/22/2014 04:29     EKG Interpretation   Date/Time:  Tuesday August 22 2014 03:19:53 EDT Ventricular Rate:  74 PR Interval:  172 QRS Duration: 102 QT Interval:  382 QTC Calculation: 424 R Axis:   63 Text Interpretation:  Normal sinus rhythm T wave abnormality, consider  lateral ischemia Abnormal ECG overall similar to previous ekgs Confirmed  by ZAVITZ  MD, JOSHUA ([id number]) on 08/22/2014 3:22:28 AM       MDM   Final diagnoses:  Left sided chest pain  Pain of left lower leg   Pt c/o left sided chest pain that started late last night as well as left lower leg pain that started 2 days ago.  Small concern for PE/DVT as CP is sharp in nature and pt describes Left lower leg pain as a "warm" sensation.  Location of leg pain more consistent with shin splints, likely from pt standing on his feet a lot.  No significant risk factors for DVT or PE.  Atypical CP for ACS, however, due to hx of CP, cardiac workup with delta troponin as well as D-dimer studies obtained.  Cardiac workup: EKG similar to previous, delta troponin- negative for elevation. D-dimer: negative.  CBC and BMP: WNL CXR: normal   Vitals: pt did have a recorded O2 Sat  of 89%, this was while pt was asleep.  During ambulation, O2 Sat 100% on RA, HR 98% on RA.  Pt denies and chest pain or SOB while ambulating. Discussed labs and imaging with pt. Also advised pt that his O2 Sat appeared to decrease while he was sleeping. Advised pt to discuss this with his PCP as pt may have sleep apnea and may benefit from a sleep study.  Pt states he feels comfortable being discharged home. Advised to f/u with his PCP. Strict return precautions provided. Pt verbalized understanding and agreement with tx plan.     Junius Finner, PA-C 08/22/14 1233  Junius Finner, PA-C 08/22/14 1234  Tilden Fossa, MD 08/22/14 1623

## 2014-08-22 NOTE — ED Notes (Signed)
SATURATION QUALIFICATIONS: (This note is used to comply with regulatory documentation for home oxygen)  Patient Saturations on Room Air at Rest = 98%  Patient Saturations on Room Air while Ambulating = 100%    

## 2014-08-22 NOTE — ED Notes (Signed)
Pt c/o chest pain since Sunday with weakness.

## 2015-08-01 ENCOUNTER — Ambulatory Visit (INDEPENDENT_AMBULATORY_CARE_PROVIDER_SITE_OTHER): Payer: Managed Care, Other (non HMO) | Admitting: Physician Assistant

## 2015-08-01 VITALS — BP 150/91 | HR 80 | Temp 98.7°F | Resp 12 | Ht 76.0 in | Wt 265.0 lb

## 2015-08-01 DIAGNOSIS — R030 Elevated blood-pressure reading, without diagnosis of hypertension: Secondary | ICD-10-CM | POA: Diagnosis not present

## 2015-08-01 DIAGNOSIS — B353 Tinea pedis: Secondary | ICD-10-CM | POA: Diagnosis not present

## 2015-08-01 DIAGNOSIS — Z8639 Personal history of other endocrine, nutritional and metabolic disease: Secondary | ICD-10-CM

## 2015-08-01 DIAGNOSIS — IMO0001 Reserved for inherently not codable concepts without codable children: Secondary | ICD-10-CM

## 2015-08-01 DIAGNOSIS — N529 Male erectile dysfunction, unspecified: Secondary | ICD-10-CM

## 2015-08-01 LAB — GLUCOSE, POCT (MANUAL RESULT ENTRY): POC Glucose: 90 mg/dl (ref 70–99)

## 2015-08-01 LAB — POCT GLYCOSYLATED HEMOGLOBIN (HGB A1C): Hemoglobin A1C: 6.1

## 2015-08-01 MED ORDER — LISINOPRIL 10 MG PO TABS: 10.0000 mg | ORAL_TABLET | Freq: Every day | ORAL | Status: DC

## 2015-08-01 MED ORDER — TADALAFIL 20 MG PO TABS: 10.0000 mg | ORAL_TABLET | ORAL | Status: DC | PRN

## 2015-08-01 MED ORDER — HYDROCORTISONE 2.5 % EX OINT: TOPICAL_OINTMENT | Freq: Two times a day (BID) | CUTANEOUS | Status: DC

## 2015-08-01 MED ORDER — CLOTRIMAZOLE 1 % EX OINT: 1.0000 "application " | TOPICAL_OINTMENT | Freq: Two times a day (BID) | CUTANEOUS | Status: DC

## 2015-08-01 NOTE — Progress Notes (Signed)
08/04/2015 9:27 AM   DOB: 1965-10-12 / MRN: [id number]  SUBJECTIVE:  Raymond Harrell is a 50 y.o. male with a pertinent medical history DM2 presenting for bilateral foot itching.  Reports this has been going of for months.  He has not tried anything for this problem.  States the itching can be so bad he has to pull his car over while driving to scratch his feet.  He has not been receiving care for diabetes and is not taking any meds.   Complains of erectile dysfunction.  Reports his relationship with his wife is good.  Has tried self stimulation without success. He does not take nitrites and denies a history of CAD.   He has No Known Allergies.   He  has a past medical history of Chest pain; Diabetes mellitus ([place]); Elevated blood pressure; Elevated LDL cholesterol level; and Abnormal CT scan.    He  reports that he has quit smoking. He does not have any smokeless tobacco history on file. He reports that he drinks alcohol. He reports that he does not use illicit drugs. He  has no sexual activity history on file. The patient  has past surgical history that includes Hand tendon surgery and Fracture surgery.  His family history includes Heart attack in his father.  Review of Systems  Constitutional: Positive for malaise/fatigue. Negative for fever, chills and diaphoresis.  HENT: Positive for congestion and sore throat.   Respiratory: Positive for cough. Negative for hemoptysis, shortness of breath and wheezing.   Cardiovascular: Negative for chest pain.  Gastrointestinal: Negative for nausea.  Skin: Negative for rash.  Neurological: Negative for dizziness and weakness.  Endo/Heme/Allergies: Negative for polydipsia.    Problem list and medications reviewed and updated by myself where necessary, and exist elsewhere in the encounter.   OBJECTIVE:  BP 150/91 mmHg  Pulse 80  Temp(Src) 98.7 F (37.1 C) (Oral)  Resp 12  Ht 6' 4" (1.93 m)  Wt 265 lb (120.203 kg)  BMI 32.27 kg/m2   SpO2 94%  Physical Exam  Constitutional: He is oriented to person, place, and time. He appears well-developed. He does not appear ill.  Eyes: Conjunctivae and EOM are normal. Pupils are equal, round, and reactive to light.  Cardiovascular: Normal rate.   Pulmonary/Chest: Effort normal.  Abdominal: He exhibits no distension.  Musculoskeletal: Normal range of motion.  Neurological: He is alert and oriented to person, place, and time. No cranial nerve deficit. Coordination normal.  Skin: Skin is warm and dry. He is not diaphoretic.  Psychiatric: He has a normal mood and affect.  Nursing note and vitals reviewed.   Results for orders placed or performed in visit on 08/01/15 (from the past 72 hour(s))  Creatinine with Est GFR     Status: None   Collection Time: 08/01/15  6:16 PM  Result Value Ref Range   Creat 0.96 0.60 - 1.35 mg/dL   GFR, Est African American >89 >=60 mL/min   GFR, Est Non African American >89 >=60 mL/min    Comment:   The estimated GFR is a calculation valid for adults (>=58 years old) that uses the CKD-EPI algorithm to adjust for age and sex. It is   not to be used for children, pregnant women, hospitalized patients,    patients on dialysis, or with rapidly changing kidney function. According to the NKDEP, eGFR >89 is normal, 60-89 shows mild impairment, 30-59 shows moderate impairment, 15-29 shows severe impairment and <15 is ESRD.  POCT glucose (manual entry)     Status: None   Collection Time: 08/01/15  6:42 PM  Result Value Ref Range   POC Glucose 90 70 - 99 mg/dl  POCT glycosylated hemoglobin (Hb A1C)     Status: None   Collection Time: 08/01/15  6:46 PM  Result Value Ref Range   Hemoglobin A1C 6.1    BP Readings from Last 3 Encounters:  08/01/15 150/91  08/22/14 139/81  06/23/13 146/80    No results found.  ASSESSMENT AND PLAN  Raymond Harrell was seen today for foot itching and sexual problem.  Diagnoses and all orders for this visit:  History  of diabetes mellitus: His A1c is has improved despite cessation of metformin.  Will continue to hold this for now and advised that he return to the office in 6 months for a recheck.   -     POCT glycosylated hemoglobin (Hb A1C) -     Creatinine with Est GFR -     POCT glucose (manual entry)  Erectile dysfunction, unspecified erectile dysfunction type: Unknown etiology.  He reports his marriage is doing well emotionally and he is unable to self stimulate.  It is reasonable to think this is likely vaculogenic given his age, mild hypertension, and borderline A1c.  If this is the case then Cialis should help.   -     tadalafil (CIALIS) 20 MG tablet; Take 0.5-1 tablets (10-20 mg total) by mouth every other day as needed for erectile dysfunction.  Elevated blood pressure: Will start today.  Last three in office readings are falling into hypertensive ranges.   -     lisinopril (PRINIVIL,ZESTRIL) 10 MG tablet; Take 1 tablet (10 mg total) by mouth daily.  Tinea pedis, recurrent: A1c pointing away from peripheral neuropathy.  Will treat for pruritis and fungus.   -     hydrocortisone 2.5 % ointment; Apply topically 2 (two) times daily. -     Clotrimazole 1 % OINT; Apply 1 application topically 2 (two) times daily. Apply copiously twice daily. Then apply steroid ointment to the bottom of the feet only.      The patient was advised to call or return to clinic if he does not see an improvement in symptoms or to seek the care of the closest emergency department if he worsens with the above plan.   Kester Clark, MHS, PA-C [facility] 08/04/2015 9:27 AM 

## 2015-08-02 LAB — CREATININE WITH EST GFR: Creat: 0.96 mg/dL (ref 0.60–1.35)

## 2015-10-23 ENCOUNTER — Ambulatory Visit (INDEPENDENT_AMBULATORY_CARE_PROVIDER_SITE_OTHER): Payer: Managed Care, Other (non HMO) | Admitting: Family Medicine

## 2015-10-23 VITALS — BP 116/64 | HR 68 | Temp 98.6°F | Resp 18 | Ht 76.0 in | Wt 264.6 lb

## 2015-10-23 DIAGNOSIS — N529 Male erectile dysfunction, unspecified: Secondary | ICD-10-CM

## 2015-10-23 DIAGNOSIS — I10 Essential (primary) hypertension: Secondary | ICD-10-CM

## 2015-10-23 DIAGNOSIS — Z1211 Encounter for screening for malignant neoplasm of colon: Secondary | ICD-10-CM | POA: Diagnosis not present

## 2015-10-23 MED ORDER — TADALAFIL 20 MG PO TABS: 20.0000 mg | ORAL_TABLET | Freq: Every day | ORAL | Status: DC | PRN

## 2015-10-23 NOTE — Assessment & Plan Note (Signed)
Recheck CMET due to ongoing use - Continue lisonopril

## 2015-10-23 NOTE — Assessment & Plan Note (Signed)
Continues to do well on Cialis.  F/U PRN otherwise.

## 2015-10-23 NOTE — Progress Notes (Signed)
Raymond Harrell is a 50 y.o. male who presents today for cialis refill and HTN  HTN - Well controlled on lisinopril.  Denies ADR's including cough, lightheadedness, blurred vision or chest pain.   Erectile Dysfunction - Has previously done well on cialis and denies blurred vision, nitro use, or chest pain.    Past Medical History  Diagnosis Date  . Chest pain     a. Negative nuc 2010 (abnormal baseline EKG). b. Adm with pleuritic CP 02/2012: negative cardiac enzymes, CTA negative for PE  but did show bronchiectasis/scarring RUL.  . Diabetes mellitus (H[facility])     Diagnosed 02/2012  . Elevated blood pressure     Noted 02/2012  . Elevated LDL cholesterol level     Noted 02/2012  . Abnormal CT scan     a. CT angio incidentally noted nodular opacity in upper abdomen felt lymph node vs part of pancreas, felt to be benign lesion with stable appearance.    History  Smoking status  . Former Smoker  Smokeless tobacco  . Not on file    Family History  Problem Relation Age of Onset  . Heart attack Father     MI in his 18's    Current Outpatient Prescriptions on File Prior to Visit  Medication Sig Dispense Refill  . aspirin 325 MG tablet Take 325 mg by mouth daily.    . Clotrimazole 1 % OINT Apply 1 application topically 2 (two) times daily. Apply copiously twice daily. Then apply steroid ointment to the bottom of the feet only. 56.7 g 3  . fish oil-omega-3 fatty acids 1000 MG capsule Take 1 g by mouth daily.    . hydrocortisone 2.5 % ointment Apply topically 2 (two) times daily. 30 g 0  . ibuprofen (ADVIL,MOTRIN) 200 MG tablet Take 400-600 mg by mouth every 8 (eight) hours as needed for mild pain or moderate pain.    Marland Kitchen lisinopril (PRINIVIL,ZESTRIL) 10 MG tablet Take 1 tablet (10 mg total) by mouth daily. 90 tablet 1  . Multiple Vitamins-Minerals (CENTRUM SILVER ULTRA MENS PO) Take 1 tablet by mouth daily.    . tadalafil (CIALIS) 20 MG tablet Take 0.5-1 tablets (10-20 mg total) by mouth  every other day as needed for erectile dysfunction. 5 tablet 11   No current facility-administered medications on file prior to visit.    ROS: Per HPI.  All other systems reviewed and are negative.   Physical Exam Filed Vitals:   10/23/15 1618  BP: 116/64  Pulse: 68  Temp: 98.6 F (37 C)  Resp: 18    Physical Examination: General appearance - alert, well appearing, and in no distress Chest - clear to auscultation, no wheezes, rales or rhonchi, symmetric air entry Heart - normal rate and regular rhythm    Chemistry      Component Value Date/Time   NA 140 08/22/2014 0328   K 3.6 08/22/2014 0328   CL 105 08/22/2014 0328   CO2 23 08/22/2014 0328   BUN 14 08/22/2014 0328   CREATININE 0.96 08/01/2015 1816   CREATININE 0.90 08/22/2014 0328      Component Value Date/Time   CALCIUM 9.0 08/22/2014 0328   ALKPHOS 66 02/24/2012 0853   AST 30 02/24/2012 0853   ALT 40 02/24/2012 0853   BILITOT 0.6 02/24/2012 0853      Lab Results  Component Value Date   WBC 5.4 08/22/2014   HGB 13.3 08/22/2014   HCT 40.7 08/22/2014   MCV 80.3 08/22/2014  PLT 182 08/22/2014   Lab Results  Component Value Date   TSH 1.994 02/24/2012   Lab Results  Component Value Date   HGBA1C 6.1 08/01/2015

## 2015-10-24 LAB — COMPREHENSIVE METABOLIC PANEL
ALT: 33 U/L (ref 9–46)
AST: 39 U/L (ref 10–40)
Albumin: 4.5 g/dL (ref 3.6–5.1)
Alkaline Phosphatase: 57 U/L (ref 40–115)
BILIRUBIN TOTAL: 0.4 mg/dL (ref 0.2–1.2)
BUN: 18 mg/dL (ref 7–25)
CALCIUM: 9.8 mg/dL (ref 8.6–10.3)
CO2: 23 mmol/L (ref 20–31)
Chloride: 101 mmol/L (ref 98–110)
Creat: 1.01 mg/dL (ref 0.60–1.35)
GLUCOSE: 89 mg/dL (ref 65–99)
Potassium: 4.3 mmol/L (ref 3.5–5.3)
Sodium: 136 mmol/L (ref 135–146)
Total Protein: 7.8 g/dL (ref 6.1–8.1)

## 2015-11-16 ENCOUNTER — Telehealth: Payer: Self-pay

## 2015-11-16 NOTE — Telephone Encounter (Signed)
Pt is requesting that we send a fax to his pharmacy decreasing his dosage of cialis from 20 mg to 10 mg  Best number [phone]

## 2015-11-19 NOTE — Telephone Encounter (Signed)
Ok to change

## 2015-11-20 MED ORDER — TADALAFIL 10 MG PO TABS
10.0000 mg | ORAL_TABLET | Freq: Every day | ORAL | Status: DC | PRN
Start: 2015-11-20 — End: 2019-01-25

## 2015-11-20 NOTE — Telephone Encounter (Signed)
Pt.notified

## 2015-11-20 NOTE — Telephone Encounter (Signed)
Good with me 

## 2015-11-20 NOTE — Telephone Encounter (Signed)
Ok changed to 10mg .

## 2015-12-10 ENCOUNTER — Encounter: Payer: Self-pay | Admitting: Family Medicine

## 2015-12-13 ENCOUNTER — Ambulatory Visit: Payer: Managed Care, Other (non HMO)

## 2016-09-17 ENCOUNTER — Ambulatory Visit (INDEPENDENT_AMBULATORY_CARE_PROVIDER_SITE_OTHER): Payer: Managed Care, Other (non HMO)

## 2016-09-17 ENCOUNTER — Ambulatory Visit (HOSPITAL_COMMUNITY)
Admission: EM | Admit: 2016-09-17 | Discharge: 2016-09-17 | Disposition: A | Discharge status: Home / Self Care | Payer: Managed Care, Other (non HMO) | Attending: Family Medicine | Admitting: Family Medicine

## 2016-09-17 ENCOUNTER — Encounter (HOSPITAL_COMMUNITY): Payer: Self-pay | Admitting: Emergency Medicine

## 2016-09-17 ENCOUNTER — Emergency Department (HOSPITAL_COMMUNITY)
Admission: EM | Admit: 2016-09-17 | Discharge: 2016-09-17 | Disposition: A | Discharge status: Home / Self Care | Payer: Managed Care, Other (non HMO) | Attending: Emergency Medicine | Admitting: Emergency Medicine

## 2016-09-17 DIAGNOSIS — Z7982 Long term (current) use of aspirin: Secondary | ICD-10-CM | POA: Insufficient documentation

## 2016-09-17 DIAGNOSIS — Z87891 Personal history of nicotine dependence: Secondary | ICD-10-CM | POA: Diagnosis not present

## 2016-09-17 DIAGNOSIS — Y999 Unspecified external cause status: Secondary | ICD-10-CM | POA: Insufficient documentation

## 2016-09-17 DIAGNOSIS — Y929 Unspecified place or not applicable: Secondary | ICD-10-CM | POA: Diagnosis not present

## 2016-09-17 DIAGNOSIS — M25561 Pain in right knee: Secondary | ICD-10-CM

## 2016-09-17 DIAGNOSIS — W228XXA Striking against or struck by other objects, initial encounter: Secondary | ICD-10-CM | POA: Diagnosis not present

## 2016-09-17 DIAGNOSIS — M25562 Pain in left knee: Secondary | ICD-10-CM | POA: Diagnosis present

## 2016-09-17 DIAGNOSIS — I10 Essential (primary) hypertension: Secondary | ICD-10-CM | POA: Diagnosis not present

## 2016-09-17 DIAGNOSIS — M7041 Prepatellar bursitis, right knee: Secondary | ICD-10-CM | POA: Insufficient documentation

## 2016-09-17 DIAGNOSIS — E039 Hypothyroidism, unspecified: Secondary | ICD-10-CM | POA: Insufficient documentation

## 2016-09-17 DIAGNOSIS — Y9389 Activity, other specified: Secondary | ICD-10-CM | POA: Insufficient documentation

## 2016-09-17 MED ORDER — LIDOCAINE HCL (PF) 1 % IJ SOLN
10.0000 mL | Freq: Once | INTRAMUSCULAR | Status: AC
  Administered 2016-09-17: 10 mL
  Filled 2016-09-17: qty 10

## 2016-09-17 MED ORDER — METHYLPREDNISOLONE 4 MG PO TBPK: ORAL_TABLET | ORAL | 0 refills | Status: DC

## 2016-09-17 MED ORDER — HYDROMORPHONE HCL 1 MG/ML IJ SOLN
1.0000 mg | Freq: Once | INTRAMUSCULAR | Status: AC
  Administered 2016-09-17: 1 mg via INTRAMUSCULAR
  Filled 2016-09-17: qty 1

## 2016-09-17 MED ORDER — TRAMADOL HCL 50 MG PO TABS: 50.0000 mg | ORAL_TABLET | Freq: Four times a day (QID) | ORAL | 0 refills | Status: DC | PRN

## 2016-09-17 MED ORDER — KETOROLAC TROMETHAMINE 30 MG/ML IJ SOLN
30.0000 mg | Freq: Once | INTRAMUSCULAR | Status: AC
  Administered 2016-09-17: 30 mg via INTRAMUSCULAR
  Filled 2016-09-17: qty 1

## 2016-09-17 MED ORDER — NAPROXEN 375 MG PO TABS: 375.0000 mg | ORAL_TABLET | Freq: Two times a day (BID) | ORAL | 0 refills | Status: DC

## 2016-09-17 NOTE — ED Provider Notes (Signed)
[facility]-EMERGENCY DEPT Provider Note   CSN: [id number] Arrival date & time: 09/17/16  1349  By signing my name below, I, Rosario Adie, attest that this documentation has been prepared under the direction and in the presence of Raeford Razor, MD. Electronically Signed: Rosario Adie, ED Scribe. 09/17/16. 2:55 PM.  History   Chief Complaint Chief Complaint  Patient presents with  . Knee Pain   The history is provided by the patient and medical records. No language interpreter was used.    HPI Comments: Raymond Harrell is a 51 y.o. male with a PMHx of HTN, DM, HLD, who presents to the Emergency Department complaining of persistent, spasmodic anterior left knee pain beginning 2-3 days ago. Per family member, pt was moving a dryer at home four days ago and struck his right foot. His pain was not initially present, however, two days later his knee pain began and has been present since. No direct injury to the knee. He was seen at [facility] prior to arrival today where he had a XR of the knee performed which was negative for bony or acute abnormality. He was referred into the ED for further management, primary concern at that time was acute pyogenic joint or gout related arthritis. His pain is exacerbated with ambulation and movement of the joint. No noted treatments for his pain were tried prior to coming into the ED. He denies fever, or any other associated symptoms.   Past Medical History:  Diagnosis Date  . Abnormal CT scan    a. CT angio incidentally noted nodular opacity in upper abdomen felt lymph node vs part of pancreas, felt to be benign lesion with stable appearance.  . Chest pain    a. Negative nuc 2010 (abnormal baseline EKG). b. Adm with pleuritic CP 02/2012: negative cardiac enzymes, CTA negative for PE  but did show bronchiectasis/scarring RUL.  . Diabetes mellitus ([facility])    Diagnosed 02/2012  . Elevated blood pressure    Noted 02/2012  . Elevated LDL cholesterol level      Noted 02/2012   Patient Active Problem List   Diagnosis Date Noted  . Essential hypertension 10/23/2015  . Erectile dysfunction 10/23/2015  . Hypothyroidism 02/19/2012  . 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 Bronchiectasis ([facility]) 02/19/2012   Past Surgical History:  Procedure Laterality Date  . FRACTURE SURGERY    . HAND TENDON SURGERY      Home Medications    Prior to Admission medications   Medication Sig Start Date End Date Taking? Authorizing Provider  aspirin 325 MG tablet Take 325 mg by mouth daily.    Historical Provider, MD  Clotrimazole 1 % OINT Apply 1 application topically 2 (two) times daily. Apply copiously twice daily. Then apply steroid ointment to the bottom of the feet only. 08/01/15   Ofilia Neas, PA-C  fish oil-omega-3 fatty acids 1000 MG capsule Take 1 g by mouth daily.    Historical Provider, MD  hydrocortisone 2.5 % ointment Apply topically 2 (two) times daily. 08/01/15   Ofilia Neas, PA-C  ibuprofen (ADVIL,MOTRIN) 200 MG tablet Take 400-600 mg by mouth every 8 (eight) hours as needed for mild pain or moderate pain.    Historical Provider, MD  lisinopril (PRINIVIL,ZESTRIL) 10 MG tablet Take 1 tablet (10 mg total) by mouth daily. 08/01/15   Ofilia Neas, PA-C  Multiple Vitamins-Minerals (CENTRUM SILVER ULTRA MENS PO) Take 1 tablet by mouth daily.    Historical Provider, MD  tadalafil (CIALIS) 10  MG tablet Take 1 tablet (10 mg total) by mouth daily as needed for erectile dysfunction. 11/20/15   Briscoe Deutscher, DO   Family History Family History  Problem Relation Age of Onset  . Heart attack Father     MI in his 64's   Social History Social History  Substance Use Topics  . Smoking status: Former Games developer  . Smokeless tobacco: Never Used  . Alcohol use 0.0 oz/week     Comment: 1 beer or 1 shot of liquor daily   Allergies   Patient has no known allergies.  Review of Systems Review of Systems  Constitutional: Negative for fever.  Musculoskeletal: Positive  for arthralgias (right knee), gait problem and myalgias.  All other systems reviewed and are negative.  Physical Exam Updated Vital Signs BP (!) 148/90 (BP Location: Right Arm)   Pulse 82   Temp 99.2 F (37.3 C) (Oral)   Resp 18   SpO2 99%   Physical Exam  Constitutional: He appears well-developed and well-nourished.  HENT:  Head: Normocephalic.  Right Ear: External ear normal.  Left Ear: External ear normal.  Nose: Nose normal.  Eyes: Conjunctivae are normal. Right eye exhibits no discharge. Left eye exhibits no discharge.  Neck: Normal range of motion.  Cardiovascular: Normal rate, regular rhythm and normal heart sounds.   No murmur heard. Pulmonary/Chest: Effort normal and breath sounds normal. No respiratory distress. He has no wheezes. He has no rales.  Abdominal: Soft. There is no tenderness. There is no rebound and no guarding.  Musculoskeletal: He exhibits tenderness.  Minimal dried blood around the nailbed of the right second toe. No significant tenderness or swelling over the area. There is mild swelling of the right knee. Tenderness was localized to the patellar tendon. Maybe a small effusion in the knee itself. No redness or increased warmth.   Neurological: He is alert. No cranial nerve deficit. Coordination normal.  Skin: Skin is warm and dry. No rash noted. No erythema. No pallor.  Psychiatric: He has a normal mood and affect. His behavior is normal.  Nursing note and vitals reviewed.  ED Treatments / Results  DIAGNOSTIC STUDIES: Oxygen Saturation is 99% on RA, normal by my interpretation.   COORDINATION OF CARE: 3:14 PM-Discussed next steps with pt. Pt verbalized understanding and is agreeable with the plan.   Labs (all labs ordered are listed, but only abnormal results are displayed) Labs Reviewed - No data to display  EKG  EKG Interpretation None      Radiology Dg Knee Complete 4 Views Right  Result Date: 09/17/2016 CLINICAL DATA:  Right knee  pain for 2 days, no known injury, initial encounter EXAM: RIGHT KNEE - COMPLETE 4+ VIEW COMPARISON:  None. FINDINGS: No evidence of fracture, dislocation, or joint effusion. No evidence of arthropathy or other focal bone abnormality. Soft tissues are unremarkable. IMPRESSION: No acute abnormality noted. Electronically Signed   By: Alcide Clever M.D.   On: 09/17/2016 13:27   Procedures Procedures   Medications Ordered in ED Medications - No data to display  Initial Impression / Assessment and Plan / ED Course  I have reviewed the triage vital signs and the nursing notes.  Pertinent labs & imaging results that were available during my care of the patient were reviewed by me and considered in my medical decision making (see chart for details).     Atraumatic knee pain. I tried tapping it w/o success. Afebrile. No clear risk factors for septic joint. Plan symptomatic  tx.Ortho FU.   Final Clinical Impressions(s) / ED Diagnoses   Final diagnoses:  Prepatellar bursitis of right knee   New Prescriptions New Prescriptions   No medications on file   I personally preformed the services scribed in my presence. The recorded information has been reviewed is accurate. Raeford Razor, MD.     Raeford Razor, MD 09/25/16 1045

## 2016-09-17 NOTE — ED Notes (Signed)
First RN in ED made aware of pt's transfer to them.

## 2016-09-17 NOTE — ED Triage Notes (Signed)
Pt arrives from [facility] sent over for further eval of right knee pain and swelling. VSS.

## 2016-09-17 NOTE — ED Provider Notes (Signed)
[facility]    CSN: [id number] Arrival date & time: 09/17/16  1231     History   Chief Complaint Chief Complaint  Patient presents with  . Knee Pain    HPI Raymond Harrell is a 51 y.o. male.   Pt woke up with pain in his right knee two days ago.  Today he complains of increased pain and mild swelling in the knee.  Patient drives a truck for living. He lives with his wife and 2 children. He is originally from [place].  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 He thinks he feels somewhat hot over the last couple days since the swelling in the right knee began. He's had no trauma to that knee and no other joints are bothering him.      Past Medical History:  Diagnosis Date  . Abnormal CT scan    a. CT angio incidentally noted nodular opacity in upper abdomen felt lymph node vs part of pancreas, felt to be benign lesion with stable appearance.  . Chest pain    a. Negative nuc 2010 (abnormal baseline EKG). b. Adm with pleuritic CP 02/2012: negative cardiac enzymes, CTA negative for PE  but did show bronchiectasis/scarring RUL.  . Diabetes mellitus (HCC)    Diagnosed 02/2012  . Elevated blood pressure    Noted 02/2012  . Elevated LDL cholesterol level    Noted 02/2012    Patient Active Problem List   Diagnosis Date Noted  . Essential hypertension 10/23/2015  . Erectile dysfunction 10/23/2015  . Hypothyroidism 02/19/2012  . 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 Bronchiectasis (HCC) 02/19/2012    Past Surgical History:  Procedure Laterality Date  . FRACTURE SURGERY    . HAND TENDON SURGERY         Home Medications    Prior to Admission medications   Medication Sig Start Date End Date Taking? Authorizing Provider  fish oil-omega-3 fatty acids 1000 MG capsule Take 1 g by mouth daily.   Yes Historical Provider, MD  aspirin 325 MG tablet Take 325 mg by mouth daily.    Historical Provider, MD  Clotrimazole 1 % OINT Apply 1  application topically 2 (two) times daily. Apply copiously twice daily. Then apply steroid ointment to the bottom of the feet only. 08/01/15   Ofilia Neas, PA-C  hydrocortisone 2.5 % ointment Apply topically 2 (two) times daily. 08/01/15   Ofilia Neas, PA-C  ibuprofen (ADVIL,MOTRIN) 200 MG tablet Take 400-600 mg by mouth every 8 (eight) hours as needed for mild pain or moderate pain.    Historical Provider, MD  lisinopril (PRINIVIL,ZESTRIL) 10 MG tablet Take 1 tablet (10 mg total) by mouth daily. 08/01/15   Ofilia Neas, PA-C  Multiple Vitamins-Minerals (CENTRUM SILVER ULTRA MENS PO) Take 1 tablet by mouth daily.    Historical Provider, MD  tadalafil (CIALIS) 10 MG tablet Take 1 tablet (10 mg total) by mouth daily as needed for erectile dysfunction. 11/20/15   Briscoe Deutscher, DO    Family History Family History  Problem Relation Age of Onset  . Heart attack Father     MI in his 24's    Social History Social History  Substance Use Topics  . Smoking status: Former Games developer  . Smokeless tobacco: Never Used  . Alcohol use 0.0 oz/week     Comment: 1 beer or 1 shot of liquor daily     Allergies   Patient has  no known allergies.   Review of Systems Review of Systems  Constitutional: Positive for fever.  Respiratory: Negative for cough.   Genitourinary: Negative for hematuria.  Musculoskeletal: Positive for gait problem and joint swelling.  Hematological: Negative for adenopathy.  All other systems reviewed and are negative.    Physical Exam Triage Vital Signs ED Triage Vitals  Enc Vitals Group     BP      Pulse      Resp      Temp      Temp src      SpO2      Weight      Height      Head Circumference      Peak Flow      Pain Score      Pain Loc      Pain Edu?      Excl. in GC?    No data found.   Updated Vital Signs BP (!) 189/75 (BP Location: Right Arm)   Pulse 90   Temp 99.8 F (37.7 C) (Oral)   SpO2 97%    Physical Exam  Constitutional: He  appears well-developed and well-nourished.  HENT:  Right Ear: External ear normal.  Left Ear: External ear normal.  Mouth/Throat: Oropharynx is clear and moist.  Eyes: Conjunctivae and EOM are normal. Pupils are equal, round, and reactive to light.  Neck: Normal range of motion. Neck supple.  Pulmonary/Chest: Effort normal.  Musculoskeletal: He exhibits tenderness. He exhibits no deformity.  Patient has exquisite tenderness on the anterior aspect of his right knee. There is a small effusion palpable but there is no other area of tenderness.  The skin overlying the knee is without cellulitis.  Neurological: He is alert.  Skin: Skin is warm and dry.  Nursing note and vitals reviewed.    UC Treatments / Results  Labs (all labs ordered are listed, but only abnormal results are displayed)  40yr ago  Interferon Gamma Release Assay POSITIVE    Comments: (NOTE)        Normal Reference Range: Negative    EKG  EKG Interpretation None       Radiology Dg Knee Complete 4 Views Right  Result Date: 09/17/2016 CLINICAL DATA:  Right knee pain for 2 days, no known injury, initial encounter EXAM: RIGHT KNEE - COMPLETE 4+ VIEW COMPARISON:  None. FINDINGS: No evidence of fracture, dislocation, or joint effusion. No evidence of arthropathy or other focal bone abnormality. Soft tissues are unremarkable. IMPRESSION: No acute abnormality noted. Electronically Signed   By: Alcide Clever M.D.   On: 09/17/2016 13:27    Procedures Procedures (including critical care time)  Medications Ordered in UC Medications - No data to display   Initial Impression / Assessment and Plan / UC Course  I have reviewed the triage vital signs and the nursing notes.  Pertinent labs & imaging results that were available during my care of the patient were reviewed by me and considered in my medical decision making (see chart for details).     Final Clinical Impressions(s) / UC Diagnoses   Final  diagnoses:  Acute pain of right knee  I'm concerned about an acute pyogenic joint or gout related arthritis. Patient will need further evaluation before definitive treatment can be initiated.  Patient is unable to walk and because he is unable to manage at home, I am sending him to the emergency department.  New Prescriptions New Prescriptions   No medications on file  Elvina Sidle, MD 09/17/16 1346

## 2016-09-17 NOTE — ED Triage Notes (Signed)
Pt woke up with pain in his right knee two days ago.  Today he complains of increased pain and mild swelling in the knee.

## 2016-09-17 NOTE — ED Notes (Signed)
Pt stable, understands discharge instructions, and reasons for return.   

## 2017-03-27 ENCOUNTER — Other Ambulatory Visit: Payer: Self-pay | Admitting: Physician Assistant

## 2017-12-09 ENCOUNTER — Other Ambulatory Visit: Payer: Self-pay

## 2017-12-09 ENCOUNTER — Emergency Department (HOSPITAL_COMMUNITY)
Admission: EM | Admit: 2017-12-09 | Discharge: 2017-12-09 | Disposition: A | Discharge status: Home / Self Care | Payer: Managed Care, Other (non HMO) | Attending: Emergency Medicine | Admitting: Emergency Medicine

## 2017-12-09 ENCOUNTER — Emergency Department (HOSPITAL_COMMUNITY): Payer: Managed Care, Other (non HMO)

## 2017-12-09 ENCOUNTER — Encounter (HOSPITAL_COMMUNITY): Payer: Self-pay | Admitting: Emergency Medicine

## 2017-12-09 DIAGNOSIS — E039 Hypothyroidism, unspecified: Secondary | ICD-10-CM | POA: Diagnosis not present

## 2017-12-09 DIAGNOSIS — Z7982 Long term (current) use of aspirin: Secondary | ICD-10-CM | POA: Insufficient documentation

## 2017-12-09 DIAGNOSIS — Z87891 Personal history of nicotine dependence: Secondary | ICD-10-CM | POA: Diagnosis not present

## 2017-12-09 DIAGNOSIS — E119 Type 2 diabetes mellitus without complications: Secondary | ICD-10-CM | POA: Insufficient documentation

## 2017-12-09 DIAGNOSIS — R079 Chest pain, unspecified: Secondary | ICD-10-CM | POA: Diagnosis present

## 2017-12-09 DIAGNOSIS — Z79899 Other long term (current) drug therapy: Secondary | ICD-10-CM | POA: Insufficient documentation

## 2017-12-09 DIAGNOSIS — I10 Essential (primary) hypertension: Secondary | ICD-10-CM | POA: Diagnosis not present

## 2017-12-09 LAB — BASIC METABOLIC PANEL
ANION GAP: 8 (ref 5–15)
BUN: 14 mg/dL (ref 6–20)
CALCIUM: 9.4 mg/dL (ref 8.9–10.3)
CO2: 26 mmol/L (ref 22–32)
Chloride: 103 mmol/L (ref 98–111)
Creatinine, Ser: 1 mg/dL (ref 0.61–1.24)
GFR calc Af Amer: >60 mL/min (ref >60)
GFR calc non Af Amer: >60 mL/min (ref >60)
GLUCOSE: 123 mg/dL — AB (ref 70–99)
Potassium: 4.2 mmol/L (ref 3.5–5.1)
Sodium: 137 mmol/L (ref 135–145)

## 2017-12-09 LAB — CBG MONITORING, ED: Glucose-Capillary: 114 mg/dL — ABNORMAL HIGH (ref 70–99)

## 2017-12-09 LAB — CBC
HCT: 46.8 % (ref 39.0–52.0)
HEMOGLOBIN: 14.4 g/dL (ref 13.0–17.0)
MCH: 25.5 pg — AB (ref 26.0–34.0)
MCHC: 30.8 g/dL (ref 30.0–36.0)
MCV: 83 fL (ref 78.0–100.0)
Platelets: 209 10*3/uL (ref 150–400)
RBC: 5.64 MIL/uL (ref 4.22–5.81)
RDW: 14.4 % (ref 11.5–15.5)
WBC: 5.9 10*3/uL (ref 4.0–10.5)

## 2017-12-09 LAB — I-STAT TROPONIN, ED
TROPONIN I, POC: 0.02 ng/mL (ref 0.00–0.08)
Troponin i, poc: 0.02 ng/mL (ref 0.00–0.08)

## 2017-12-09 LAB — D-DIMER, QUANTITATIVE (NOT AT ARMC)

## 2017-12-09 MED ORDER — ASPIRIN 81 MG PO CHEW
324.0000 mg | CHEWABLE_TABLET | Freq: Once | ORAL | Status: AC
  Administered 2017-12-09: 324 mg via ORAL
  Filled 2017-12-09: qty 4

## 2017-12-09 MED ORDER — KETOROLAC TROMETHAMINE 30 MG/ML IJ SOLN
30.0000 mg | Freq: Once | INTRAMUSCULAR | Status: AC
  Administered 2017-12-09: 30 mg via INTRAVENOUS
  Filled 2017-12-09: qty 1

## 2017-12-09 MED ORDER — LISINOPRIL 20 MG PO TABS: 20.0000 mg | ORAL_TABLET | Freq: Every day | ORAL | 3 refills | Status: AC

## 2017-12-09 NOTE — ED Provider Notes (Addendum)
9 AM-she received in checkout to evaluate second troponin which is returned and is normal.  Repeat vital signs, normal except blood pressure elevated 161/98.  Patient is being evaluated for chest pain.  Pain is been present since yesterday afternoon.  No personal history of coronary artery disease.  Low risk cardiac profile.  Patient is not currently taking any prescribed medication.  He does not have a primary care doctor.  States he last took an unknown pink pill for blood pressure, 2 or 3 months ago.  He has had previous evaluations for chest pain.  Results for orders placed or performed during the hospital encounter of 12/09/17  Basic metabolic panel  Result Value Ref Range   Sodium 137 135 - 145 mmol/L   Potassium 4.2 3.5 - 5.1 mmol/L   Chloride 103 98 - 111 mmol/L   CO2 26 22 - 32 mmol/L   Glucose, Bld 123 (H) 70 - 99 mg/dL   BUN 14 6 - 20 mg/dL   Creatinine, Ser 1.611.00 0.61 - 1.24 mg/dL   Calcium 9.4 8.9 - 09.610.3 mg/dL   GFR calc non Af Amer >60 >60 mL/min   GFR calc Af Amer >60 >60 mL/min   Anion gap 8 5 - 15  CBC  Result Value Ref Range   WBC 5.9 4.0 - 10.5 K/uL   RBC 5.64 4.22 - 5.81 MIL/uL   Hemoglobin 14.4 13.0 - 17.0 g/dL   HCT 04.546.8 40.939.0 - 81.152.0 %   MCV 83.0 78.0 - 100.0 fL   MCH 25.5 (L) 26.0 - 34.0 pg   MCHC 30.8 30.0 - 36.0 g/dL   RDW 91.414.4 78.211.5 - 95.615.5 %   Platelets 209 150 - 400 K/uL  D-dimer, quantitative (not [facility])  Result Value Ref Range   D-Dimer, Quant <0.27 0.00 - 0.50 ug/mL-FEU  I-stat troponin, ED  Result Value Ref Range   Troponin i, poc 0.02 0.00 - 0.08 ng/mL   Comment 3          POC CBG, ED  Result Value Ref Range   Glucose-Capillary 114 (H) 70 - 99 mg/dL   Comment 1 Notify RN    Comment 2 Document in Chart   I-Stat Troponin, ED (not [facility])  Result Value Ref Range   Troponin i, poc 0.02 0.00 - 0.08 ng/mL   Comment 3            Patient Vitals for the past 24 hrs:  BP Temp Temp src Pulse Resp SpO2 Height Weight  12/09/17 0900 - - - 65 17 98 % - -   12/09/17 0845 (!) 161/98 - - 65 16 98 % - -  12/09/17 0815 - - - 66 15 100 % - -  12/09/17 0745 - - - 65 17 100 % - -  12/09/17 0700 128/78 - - (!) 57 (!) 24 99 % - -  12/09/17 0600 (!) 148/86 - - 75 - 100 % - -  12/09/17 0545 (!) 156/84 - - 68 20 97 % - -  12/09/17 0530 (!) 146/88 - - 67 19 100 % - -  12/09/17 0447 - - - - - - 6\' 4"  (1.93 m) 113.4 kg (250 lb)  12/09/17 0445 (!) 154/90 99.1 F (37.3 C) Oral 75 16 99 % - -    MDM-nonspecific chest discomfort with component of musculoskeletal type pain.  Screening for occult MI negative.  Negative delta troponin.  EKG reassuring.  Hypertension without signs of endorgan damage.  Nursing Notes Reviewed/ Care Coordinated Applicable Imaging Reviewed Interpretation of Laboratory Data incorporated into ED treatment  The patient appears reasonably screened and/or stabilized for discharge and I doubt any other medical condition or other [facility] requiring further screening, evaluation, or treatment in the ED at this time prior to discharge.  Plan: Home Medications-OTC analgesia of choice.; Home Treatments- DASH diet; return here if the recommended treatment, does not improve the symptoms; Recommended follow up-PCP of choice for ongoing management in 2 or 3 days.    Mancel Bale, MD 12/09/17 [phone]  Patient's wife arrived, she is a nurse, and would be more comfortable with him being discharged on a prescription for lisinopril.  She states that this was the last medication he used for blood pressure.  I agreed to do that.    Mancel Bale, MD 12/09/17 [phone]

## 2017-12-09 NOTE — ED Notes (Signed)
ED Provider at bedside. 

## 2017-12-09 NOTE — ED Notes (Addendum)
Patient transported to X-ray. Lab contacted to add on d-dimer.

## 2017-12-09 NOTE — Discharge Instructions (Addendum)
Try taking Tylenol for pain.  Use the resource guide to find a doctor to see for ongoing management and treatment of your blood pressure and further treatment of chest pain, if needed.

## 2017-12-09 NOTE — ED Provider Notes (Signed)
[facility] Provider Note   CSN: [id number] Arrival date & time: 12/09/17  0435     History   Chief Complaint Chief Complaint  Patient presents with  . Chest Pain    HPI Raymond Harrell is a 52 y.o. male.  HPI  This is a [age] year old male who presents with chest pain.  History of diabetes, hypertension, hyperlipidemia.  Patient presents that he woke up this morning with left sided chest pain.  Ongoing since yesterday afternoon.  Comes and goes.  Not exertional in nature.  At times reports some worsening with deep breaths.  Current pain is 6 out of 10.  He describes it as stabbing and nonradiating.  He does report nausea.  No shortness of breath, cough, fever.  Denies any history of blood clots, recent travel, recent hospitalization.  No history of similar pain in the past.  Past Medical History:  Diagnosis Date  . Abnormal CT scan    a. CT angio incidentally noted nodular opacity in upper abdomen felt lymph node vs part of pancreas, felt to be benign lesion with stable appearance.  . Chest pain    a. Negative nuc 2010 (abnormal baseline EKG). b. Adm with pleuritic CP 02/2012: negative cardiac enzymes, CTA negative for PE  but did show bronchiectasis/scarring RUL.  . Diabetes mellitus (HCC)    Diagnosed 02/2012  . Elevated blood pressure    Noted 02/2012  . Elevated LDL cholesterol level    Noted 02/2012    Patient Active Problem List   Diagnosis Date Noted  . Essential hypertension 10/23/2015  . Erectile dysfunction 10/23/2015  . Hypothyroidism 02/19/2012  . 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 Bronchiectasis (HCC) 02/19/2012    Past Surgical History:  Procedure Laterality Date  . FRACTURE SURGERY    . HAND TENDON SURGERY          Home Medications    Prior to Admission medications   Medication Sig Start Date End Date Taking? Authorizing Provider  aspirin 325 MG tablet Take 325 mg by mouth daily.    [provider]    Clotrimazole 1 % OINT Apply 1 application topically 2 (two) times daily. Apply copiously twice daily. Then apply steroid ointment to the bottom of the feet only. 08/01/15   Ofilia Neaslark, Jsoeph L, PA-C  fish oil-omega-3 fatty acids 1000 MG capsule Take 1 g by mouth daily.    [provider]  hydrocortisone 2.5 % ointment Apply topically 2 (two) times daily. 08/01/15   Ofilia Neaslark, Kylor L, PA-C  ibuprofen (ADVIL,MOTRIN) 200 MG tablet Take 400-600 mg by mouth every 8 (eight) hours as needed for mild pain or moderate pain.    [provider]  lisinopril (PRINIVIL,ZESTRIL) 10 MG tablet TAKE 1 TABLET BY MOUTH EVERY DAY 03/27/17   Ofilia Neaslark, Antavius L, PA-C  methylPREDNISolone (MEDROL DOSEPAK) 4 MG TBPK tablet 6 tablets day 1, then 5 tablets day 2, then 4, 3, 2, 1. 09/17/16   Raeford RazorKohut, Stephen, MD  Multiple Vitamins-Minerals (CENTRUM SILVER ULTRA MENS PO) Take 1 tablet by mouth daily.    [provider]  naproxen (NAPROSYN) 375 MG tablet Take 1 tablet (375 mg total) by mouth 2 (two) times daily. 09/17/16   Raeford RazorKohut, Stephen, MD  tadalafil (CIALIS) 10 MG tablet Take 1 tablet (10 mg total) by mouth daily as needed for erectile dysfunction. 11/20/15   Hess, Twana FirstBryan R, DO  traMADol (ULTRAM) 50 MG tablet Take 1 tablet (50 mg total) by mouth every 6 (  six) hours as needed. 09/17/16   Raeford Razor, MD    Family History Family History  Problem Relation Age of Onset  . Heart attack Father        MI in his 52's    Social History Social History   Tobacco Use  . Smoking status: Former Games developer  . Smokeless tobacco: Never Used  Substance Use Topics  . Alcohol use: Yes    Comment: 1 beer or 1 shot of liquor daily  . Drug use: No     Allergies   Patient has no known allergies.   Review of Systems Review of Systems  Constitutional: Negative for fever.  Respiratory: Negative for cough and shortness of breath.   Cardiovascular: Positive for chest pain. Negative for leg swelling.  Gastrointestinal:  Negative for abdominal pain, nausea and vomiting.  Genitourinary: Negative for dysuria.  All other systems reviewed and are negative.    Physical Exam Updated Vital Signs BP (!) 148/86   Pulse 75   Temp 99.1 F (37.3 C) (Oral)   Resp 20   Ht 6\' 4"  (1.93 m)   Wt 113.4 kg (250 lb)   SpO2 100%   BMI 30.43 kg/m   Physical Exam  Constitutional: He is oriented to person, place, and time. He appears well-developed and well-nourished.  HENT:  Head: Normocephalic and atraumatic.  Eyes: Pupils are equal, round, and reactive to light.  Neck: Neck supple.  Cardiovascular: Normal rate, regular rhythm, normal heart sounds and normal pulses.  No murmur heard. Pulmonary/Chest: Effort normal and breath sounds normal. No respiratory distress. He has no wheezes.  Abdominal: Soft. Bowel sounds are normal. There is no tenderness. There is no rebound.  Musculoskeletal: He exhibits no edema.       Right lower leg: He exhibits no tenderness and no edema.       Left lower leg: He exhibits no tenderness and no edema.  Lymphadenopathy:    He has no cervical adenopathy.  Neurological: He is alert and oriented to person, place, and time.  Skin: Skin is warm and dry.  Psychiatric: He has a normal mood and affect.  Nursing note and vitals reviewed.    ED Treatments / Results  Labs (all labs ordered are listed, but only abnormal results are displayed) Labs Reviewed  BASIC METABOLIC PANEL - Abnormal; Notable for the following components:      Result Value   Glucose, Bld 123 (*)    All other components within normal limits  CBC - Abnormal; Notable for the following components:   MCH 25.5 (*)    All other components within normal limits  CBG MONITORING, ED - Abnormal; Notable for the following components:   Glucose-Capillary 114 (*)    All other components within normal limits  D-DIMER, QUANTITATIVE (NOT AT [facility])  I-STAT TROPONIN, ED    EKG None  ED ECG REPORT   Date: 12/09/2017  Rate:  70  Rhythm: normal sinus rhythm  QRS Axis: normal  Intervals: normal  ST/T Wave abnormalities: nonspecific T wave changes  Conduction Disutrbances:none  Narrative Interpretation:   Old EKG Reviewed: unchanged  I have personally reviewed the EKG tracing and agree with the computerized printout as noted.   Radiology Dg Chest 2 View  Result Date: 12/09/2017 CLINICAL DATA:  52 y/o  M; left-sided chest seen. EXAM: CHEST - 2 VIEW COMPARISON:  08/21/2016 chest radiograph.  02/18/2012 CT chest. FINDINGS: Stable heart size and mediastinal contours are within normal limits. Stable right apical  pleuroparenchymal scarring and bronchiectasis. No consolidation, effusion, or pneumothorax. The visualized skeletal structures are unremarkable. IMPRESSION: No acute pulmonary process identified. Electronically Signed   By: Mitzi Hansen M.D.   On: 12/09/2017 05:57    Procedures Procedures (including critical care time)  Medications Ordered in ED Medications  ketorolac (TORADOL) 30 MG/ML injection 30 mg (has no administration in time range)  aspirin chewable tablet 324 mg (324 mg Oral Given 12/09/17 0529)     Initial Impression / Assessment and Plan / ED Course  I have reviewed the triage vital signs and the nursing notes.  Pertinent labs & imaging results that were available during my care of the patient were reviewed by me and considered in my medical decision making (see chart for details).  Clinical Course as of Dec 10 646  Wed Dec 09, 2017  1610 Initial work-up is reassuring including troponin and d-dimer.  On recheck, patient has continued pain.  Now it seems to be worse with movement.  Patient does Toradol.  Will repeat troponin at 3 hours.   [CH]    Clinical Course User Index [CH] Horton, Mayer Masker, MD    Patient presents with chest pain.  Somewhat atypical in nature.  Pleuritic.  He is overall nontoxic-appearing.  Vital signs reassuring.  His exam is benign.  EKG shows no  evidence of acute ischemia.  X-ray shows no evidence of pneumothorax or pneumonia.  He is low risk for blood clots and gone however, given pleuritic nature of pain, screening d-dimer was sent.  This is negative.  Initial troponin is negative.  See recheck above.  On recheck, patient endorses worse with movement and there appears to be possible muscular component.  He was dosed Toradol.  Will repeat troponin at 3 hours given patient's risk factors.  If troponin is negative, patient can follow-up closely with cardiology.  Final Clinical Impressions(s) / ED Diagnoses   Final diagnoses:  Atypical chest pain    ED Discharge Orders    None       Shon Baton, MD 12/09/17 [phone]

## 2017-12-09 NOTE — ED Notes (Signed)
Pt stable, ambulatory, and verbalizes understanding of d/c instructions.  

## 2017-12-09 NOTE — ED Triage Notes (Signed)
Pt reports he woke up today w/ stabbing left sided chest pain. Pt reports the pain has "been coming and going" since Tuesday.  Pt reports nausea and "a little" shortness of breath but denies weakness or light headness.

## 2018-12-19 ENCOUNTER — Emergency Department (HOSPITAL_COMMUNITY): Payer: Managed Care, Other (non HMO)

## 2018-12-19 ENCOUNTER — Other Ambulatory Visit: Payer: Self-pay

## 2018-12-19 ENCOUNTER — Encounter (HOSPITAL_COMMUNITY): Payer: Self-pay

## 2018-12-19 ENCOUNTER — Inpatient Hospital Stay (HOSPITAL_COMMUNITY)
Admission: EM | Admit: 2018-12-19 | Discharge: 2018-12-22 | DRG: 871 | Disposition: A | Discharge status: Home / Self Care | Payer: Managed Care, Other (non HMO) | Attending: Internal Medicine | Admitting: Internal Medicine

## 2018-12-19 DIAGNOSIS — A4189 Other specified sepsis: Secondary | ICD-10-CM | POA: Diagnosis present

## 2018-12-19 DIAGNOSIS — E1169 Type 2 diabetes mellitus with other specified complication: Secondary | ICD-10-CM | POA: Diagnosis not present

## 2018-12-19 DIAGNOSIS — K760 Fatty (change of) liver, not elsewhere classified: Secondary | ICD-10-CM | POA: Diagnosis present

## 2018-12-19 DIAGNOSIS — I10 Essential (primary) hypertension: Secondary | ICD-10-CM | POA: Diagnosis present

## 2018-12-19 DIAGNOSIS — J1289 Other viral pneumonia: Secondary | ICD-10-CM

## 2018-12-19 DIAGNOSIS — E119 Type 2 diabetes mellitus without complications: Secondary | ICD-10-CM | POA: Diagnosis present

## 2018-12-19 DIAGNOSIS — E039 Hypothyroidism, unspecified: Secondary | ICD-10-CM | POA: Diagnosis present

## 2018-12-19 DIAGNOSIS — Z79891 Long term (current) use of opiate analgesic: Secondary | ICD-10-CM | POA: Diagnosis not present

## 2018-12-19 DIAGNOSIS — U071 COVID-19: Secondary | ICD-10-CM

## 2018-12-19 DIAGNOSIS — J9601 Acute respiratory failure with hypoxia: Secondary | ICD-10-CM | POA: Diagnosis present

## 2018-12-19 DIAGNOSIS — Z23 Encounter for immunization: Secondary | ICD-10-CM | POA: Diagnosis not present

## 2018-12-19 DIAGNOSIS — J069 Acute upper respiratory infection, unspecified: Secondary | ICD-10-CM

## 2018-12-19 DIAGNOSIS — A419 Sepsis, unspecified organism: Secondary | ICD-10-CM

## 2018-12-19 DIAGNOSIS — K37 Unspecified appendicitis: Secondary | ICD-10-CM | POA: Diagnosis present

## 2018-12-19 DIAGNOSIS — Z7982 Long term (current) use of aspirin: Secondary | ICD-10-CM

## 2018-12-19 DIAGNOSIS — Z87891 Personal history of nicotine dependence: Secondary | ICD-10-CM | POA: Diagnosis not present

## 2018-12-19 DIAGNOSIS — Z7289 Other problems related to lifestyle: Secondary | ICD-10-CM | POA: Diagnosis not present

## 2018-12-19 DIAGNOSIS — Z79899 Other long term (current) drug therapy: Secondary | ICD-10-CM

## 2018-12-19 DIAGNOSIS — E860 Dehydration: Secondary | ICD-10-CM | POA: Diagnosis present

## 2018-12-19 DIAGNOSIS — K3580 Unspecified acute appendicitis: Secondary | ICD-10-CM | POA: Diagnosis present

## 2018-12-19 DIAGNOSIS — E785 Hyperlipidemia, unspecified: Secondary | ICD-10-CM | POA: Diagnosis present

## 2018-12-19 DIAGNOSIS — Z8249 Family history of ischemic heart disease and other diseases of the circulatory system: Secondary | ICD-10-CM | POA: Diagnosis not present

## 2018-12-19 DIAGNOSIS — N529 Male erectile dysfunction, unspecified: Secondary | ICD-10-CM | POA: Diagnosis present

## 2018-12-19 DIAGNOSIS — J1282 Pneumonia due to coronavirus disease 2019: Secondary | ICD-10-CM

## 2018-12-19 LAB — CBC WITH DIFFERENTIAL/PLATELET
Abs Immature Granulocytes: 0.03 10*3/uL (ref 0.00–0.07)
Basophils Absolute: 0 10*3/uL (ref 0.0–0.1)
Basophils Relative: 0 %
Eosinophils Absolute: 0 10*3/uL (ref 0.0–0.5)
Eosinophils Relative: 0 %
HCT: 44.3 % (ref 39.0–52.0)
Hemoglobin: 14.1 g/dL (ref 13.0–17.0)
Immature Granulocytes: 0 %
Lymphocytes Relative: 14 %
Lymphs Abs: 1.1 10*3/uL (ref 0.7–4.0)
MCH: 25.3 pg — ABNORMAL LOW (ref 26.0–34.0)
MCHC: 31.8 g/dL (ref 30.0–36.0)
MCV: 79.5 fL — ABNORMAL LOW (ref 80.0–100.0)
Monocytes Absolute: 0.6 10*3/uL (ref 0.1–1.0)
Monocytes Relative: 7 %
Neutro Abs: 6.6 10*3/uL (ref 1.7–7.7)
Neutrophils Relative %: 79 %
Platelets: 182 10*3/uL (ref 150–400)
RBC: 5.57 MIL/uL (ref 4.22–5.81)
RDW: 13.8 % (ref 11.5–15.5)
WBC: 8.4 10*3/uL (ref 4.0–10.5)
nRBC: 0 % (ref 0.0–0.2)

## 2018-12-19 LAB — URINALYSIS, ROUTINE W REFLEX MICROSCOPIC
Bacteria, UA: NONE SEEN
Bilirubin Urine: NEGATIVE
Glucose, UA: NEGATIVE mg/dL
Hgb urine dipstick: NEGATIVE
Ketones, ur: NEGATIVE mg/dL
Leukocytes,Ua: NEGATIVE
Nitrite: NEGATIVE
Protein, ur: 30 mg/dL — AB
Specific Gravity, Urine: 1.035 — ABNORMAL HIGH (ref 1.005–1.030)
pH: 6 (ref 5.0–8.0)

## 2018-12-19 LAB — CBC
HCT: 40.6 % (ref 39.0–52.0)
Hemoglobin: 13.3 g/dL (ref 13.0–17.0)
MCH: 25.7 pg — ABNORMAL LOW (ref 26.0–34.0)
MCHC: 32.8 g/dL (ref 30.0–36.0)
MCV: 78.4 fL — ABNORMAL LOW (ref 80.0–100.0)
Platelets: 169 10*3/uL (ref 150–400)
RBC: 5.18 MIL/uL (ref 4.22–5.81)
RDW: 14 % (ref 11.5–15.5)
WBC: 9.2 10*3/uL (ref 4.0–10.5)
nRBC: 0 % (ref 0.0–0.2)

## 2018-12-19 LAB — COMPREHENSIVE METABOLIC PANEL
ALT: 35 U/L (ref 0–44)
AST: 55 U/L — ABNORMAL HIGH (ref 15–41)
Albumin: 3.6 g/dL (ref 3.5–5.0)
Alkaline Phosphatase: 56 U/L (ref 38–126)
Anion gap: 12 (ref 5–15)
BUN: 11 mg/dL (ref 6–20)
CO2: 22 mmol/L (ref 22–32)
Calcium: 9 mg/dL (ref 8.9–10.3)
Chloride: 98 mmol/L (ref 98–111)
Creatinine, Ser: 1.02 mg/dL (ref 0.61–1.24)
GFR calc Af Amer: >60 mL/min (ref >60)
GFR calc non Af Amer: >60 mL/min (ref >60)
Glucose, Bld: 132 mg/dL — ABNORMAL HIGH (ref 70–99)
Potassium: 3.5 mmol/L (ref 3.5–5.1)
Sodium: 132 mmol/L — ABNORMAL LOW (ref 135–145)
Total Bilirubin: 0.9 mg/dL (ref 0.3–1.2)
Total Protein: 8 g/dL (ref 6.5–8.1)

## 2018-12-19 LAB — ABO/RH: ABO/RH(D): O POS

## 2018-12-19 LAB — CREATININE, SERUM
Creatinine, Ser: 0.92 mg/dL (ref 0.61–1.24)
GFR calc Af Amer: >60 mL/min (ref >60)
GFR calc non Af Amer: >60 mL/min (ref >60)

## 2018-12-19 LAB — GLUCOSE, CAPILLARY: Glucose-Capillary: 169 mg/dL — ABNORMAL HIGH (ref 70–99)

## 2018-12-19 LAB — LIPASE, BLOOD: Lipase: 41 U/L (ref 11–51)

## 2018-12-19 LAB — SARS CORONAVIRUS 2 BY RT PCR (HOSPITAL ORDER, PERFORMED IN ~~LOC~~ HOSPITAL LAB): SARS Coronavirus 2: POSITIVE — AB

## 2018-12-19 MED ORDER — PIPERACILLIN-TAZOBACTAM 3.375 G IVPB 30 MIN
3.3750 g | Freq: Once | INTRAVENOUS | Status: AC
  Administered 2018-12-19: 15:00:00 3.375 g via INTRAVENOUS
  Filled 2018-12-19: qty 50

## 2018-12-19 MED ORDER — LACTATED RINGERS IV SOLN
INTRAVENOUS | Status: DC
  Administered 2018-12-19: 18:00:00 via INTRAVENOUS

## 2018-12-19 MED ORDER — OMEGA-3 FATTY ACIDS 1000 MG PO CAPS: 1.0000 g | ORAL_CAPSULE | Freq: Every day | ORAL | Status: DC

## 2018-12-19 MED ORDER — ACETAMINOPHEN 650 MG RE SUPP: 650.0000 mg | Freq: Four times a day (QID) | RECTAL | Status: DC | PRN

## 2018-12-19 MED ORDER — IOHEXOL 300 MG/ML  SOLN
100.0000 mL | Freq: Once | INTRAMUSCULAR | Status: AC | PRN
  Administered 2018-12-19: 100 mL via INTRAVENOUS

## 2018-12-19 MED ORDER — PIPERACILLIN-TAZOBACTAM 3.375 G IVPB
3.3750 g | Freq: Three times a day (TID) | INTRAVENOUS | Status: DC
  Administered 2018-12-20 – 2018-12-22 (×7): 3.375 g via INTRAVENOUS
  Filled 2018-12-19 (×11): qty 50

## 2018-12-19 MED ORDER — SODIUM CHLORIDE 0.9 % IV SOLN
INTRAVENOUS | Status: DC
  Administered 2018-12-19: 10:00:00 1000 mL via INTRAVENOUS

## 2018-12-19 MED ORDER — ONDANSETRON HCL 4 MG/2ML IJ SOLN
4.0000 mg | Freq: Four times a day (QID) | INTRAMUSCULAR | Status: DC | PRN
  Administered 2018-12-19: 18:00:00 4 mg via INTRAVENOUS
  Filled 2018-12-19: qty 2

## 2018-12-19 MED ORDER — INSULIN ASPART 100 UNIT/ML ~~LOC~~ SOLN
0.0000 [IU] | Freq: Three times a day (TID) | SUBCUTANEOUS | Status: DC
  Administered 2018-12-20: 2 [IU] via SUBCUTANEOUS
  Administered 2018-12-20: 1 [IU] via SUBCUTANEOUS
  Administered 2018-12-20 – 2018-12-21 (×2): 2 [IU] via SUBCUTANEOUS
  Administered 2018-12-21: 12:00:00 3 [IU] via SUBCUTANEOUS
  Administered 2018-12-21: 09:00:00 2 [IU] via SUBCUTANEOUS
  Administered 2018-12-22: 09:00:00 1 [IU] via SUBCUTANEOUS

## 2018-12-19 MED ORDER — ENOXAPARIN SODIUM 40 MG/0.4ML ~~LOC~~ SOLN
40.0000 mg | SUBCUTANEOUS | Status: DC
  Administered 2018-12-19 – 2018-12-21 (×3): 40 mg via SUBCUTANEOUS
  Filled 2018-12-19 (×3): qty 0.4

## 2018-12-19 MED ORDER — ACETAMINOPHEN 325 MG PO TABS
650.0000 mg | ORAL_TABLET | Freq: Four times a day (QID) | ORAL | Status: DC | PRN
  Administered 2018-12-19 – 2018-12-21 (×2): 650 mg via ORAL
  Filled 2018-12-19 (×2): qty 2

## 2018-12-19 MED ORDER — ONDANSETRON HCL 4 MG PO TABS: 4.0000 mg | ORAL_TABLET | Freq: Four times a day (QID) | ORAL | Status: DC | PRN

## 2018-12-19 MED ORDER — ACETAMINOPHEN 325 MG PO TABS
650.0000 mg | ORAL_TABLET | Freq: Once | ORAL | Status: AC
  Administered 2018-12-19: 650 mg via ORAL
  Filled 2018-12-19: qty 2

## 2018-12-19 MED ORDER — SODIUM CHLORIDE 0.9 % IV SOLN: 100.0000 mg | INTRAVENOUS | Status: DC

## 2018-12-19 MED ORDER — SODIUM CHLORIDE 0.9 % IV SOLN
200.0000 mg | Freq: Once | INTRAVENOUS | Status: DC
  Filled 2018-12-19: qty 40

## 2018-12-19 MED ORDER — MORPHINE SULFATE (PF) 2 MG/ML IV SOLN
1.0000 mg | INTRAVENOUS | Status: DC | PRN
  Administered 2018-12-19 – 2018-12-21 (×2): 1 mg via INTRAVENOUS
  Filled 2018-12-19 (×2): qty 1

## 2018-12-19 MED ORDER — SODIUM CHLORIDE 0.9 % IV SOLN
100.0000 mg | INTRAVENOUS | Status: DC
  Administered 2018-12-21: 100 mg via INTRAVENOUS
  Filled 2018-12-19 (×2): qty 20

## 2018-12-19 MED ORDER — MORPHINE SULFATE (PF) 4 MG/ML IV SOLN
4.0000 mg | Freq: Once | INTRAVENOUS | Status: AC
  Administered 2018-12-19: 15:00:00 4 mg via INTRAVENOUS
  Filled 2018-12-19: qty 1

## 2018-12-19 MED ORDER — HYDROMORPHONE HCL 1 MG/ML IJ SOLN
1.0000 mg | Freq: Once | INTRAMUSCULAR | Status: AC
  Administered 2018-12-19: 1 mg via INTRAVENOUS
  Filled 2018-12-19: qty 1

## 2018-12-19 MED ORDER — PANTOPRAZOLE SODIUM 40 MG IV SOLR
40.0000 mg | INTRAVENOUS | Status: DC
  Administered 2018-12-19 – 2018-12-21 (×3): 40 mg via INTRAVENOUS
  Filled 2018-12-19 (×3): qty 40

## 2018-12-19 MED ORDER — METHYLPREDNISOLONE SODIUM SUCC 125 MG IJ SOLR
60.0000 mg | Freq: Two times a day (BID) | INTRAMUSCULAR | Status: DC
  Administered 2018-12-19 – 2018-12-22 (×6): 60 mg via INTRAVENOUS
  Filled 2018-12-19 (×6): qty 2

## 2018-12-19 MED ORDER — SODIUM CHLORIDE 0.9 % IV SOLN
200.0000 mg | Freq: Once | INTRAVENOUS | Status: AC
  Administered 2018-12-19: 200 mg via INTRAVENOUS
  Filled 2018-12-19 (×2): qty 40

## 2018-12-19 MED ORDER — PNEUMOCOCCAL VAC POLYVALENT 25 MCG/0.5ML IJ INJ
0.5000 mL | INJECTION | INTRAMUSCULAR | Status: DC
  Filled 2018-12-19: qty 0.5

## 2018-12-19 MED ORDER — ROSUVASTATIN CALCIUM 20 MG PO TABS
20.0000 mg | ORAL_TABLET | Freq: Every day | ORAL | Status: DC
  Administered 2018-12-20: 20 mg via ORAL
  Filled 2018-12-19: qty 1

## 2018-12-19 MED ORDER — OMEGA-3-ACID ETHYL ESTERS 1 G PO CAPS
1.0000 g | ORAL_CAPSULE | Freq: Every day | ORAL | Status: DC
  Administered 2018-12-20 – 2018-12-22 (×3): 1 g via ORAL
  Filled 2018-12-19 (×5): qty 1

## 2018-12-19 MED ORDER — VITAMIN D 25 MCG (1000 UNIT) PO TABS
1000.0000 [IU] | ORAL_TABLET | Freq: Every day | ORAL | Status: DC
  Administered 2018-12-20 – 2018-12-22 (×3): 1000 [IU] via ORAL
  Filled 2018-12-19 (×3): qty 1

## 2018-12-19 NOTE — Consult Note (Signed)
Reason for Consult: Acute appendicitis in COVID 19+ patient Referring Physician: Coralie KeensArrien, Mauricio Daniel, MD  Tyrone AppleMichael A Harrell is an 53 y.o. male.  HPI: Asked see patient at the request of Dr. Coralie KeensArrien, Mauricio Daniel, MD for abdominal pain x1 day.  The patient came to the emergency room this morning complaining of fever and shortness of breath.  Work-up revealed that he was COVID-19 positive.  He also complained of abdominal pain which a CT scan was ordered to evaluate this.  His abdominal pain is in his right lower quadrant.  It is mild to moderate in intensity sometimes crampy at times.  He has had it since yesterday.  CT scan showed acute appendicitis without abscess or perforation.  He also has groundglass opacities on his CT scan consistent with COVID-19 pneumonitis/pneumonia.  Surgical consultation was requested for management.  Past Medical History:  Diagnosis Date  . Abnormal CT scan    a. CT angio incidentally noted nodular opacity in upper abdomen felt lymph node vs part of pancreas, felt to be benign lesion with stable appearance.  . Chest pain    a. Negative nuc 2010 (abnormal baseline EKG). b. Adm with pleuritic CP 02/2012: negative cardiac enzymes, CTA negative for PE  but did show bronchiectasis/scarring RUL.  . Diabetes mellitus (HCC)    Diagnosed 02/2012  . Elevated blood pressure    Noted 02/2012  . Elevated LDL cholesterol level    Noted 02/2012    Past Surgical History:  Procedure Laterality Date  . FRACTURE SURGERY    . HAND TENDON SURGERY      Family History  Problem Relation Age of Onset  . Heart attack Father        MI in his 4650's    Social History:  reports that he has quit smoking. He has never used smokeless tobacco. He reports current alcohol use. He reports that he does not use drugs.  Allergies: No Known Allergies  Medications: I have reviewed the patient's current medications.  Results for orders placed or performed during the hospital encounter  of 12/19/18 (from the past 48 hour(s))  Comprehensive metabolic panel     Status: Abnormal   Collection Time: 12/19/18  9:36 AM  Result Value Ref Range   Sodium 132 (L) 135 - 145 mmol/L   Potassium 3.5 3.5 - 5.1 mmol/L   Chloride 98 98 - 111 mmol/L   CO2 22 22 - 32 mmol/L   Glucose, Bld 132 (H) 70 - 99 mg/dL   BUN 11 6 - 20 mg/dL   Creatinine, Ser 1.611.02 0.61 - 1.24 mg/dL   Calcium 9.0 8.9 - 09.610.3 mg/dL   Total Protein 8.0 6.5 - 8.1 g/dL   Albumin 3.6 3.5 - 5.0 g/dL   AST 55 (H) 15 - 41 U/L   ALT 35 0 - 44 U/L   Alkaline Phosphatase 56 38 - 126 U/L   Total Bilirubin 0.9 0.3 - 1.2 mg/dL   GFR calc non Af Amer >60 >60 mL/min   GFR calc Af Amer >60 >60 mL/min   Anion gap 12 5 - 15    Comment: Performed at [facility], [address] [place], [place] [address]  CBC with Differential     Status: Abnormal   Collection Time: 12/19/18  9:36 AM  Result Value Ref Range   WBC 8.4 4.0 - 10.5 K/uL   RBC 5.57 4.22 - 5.81 MIL/uL   Hemoglobin 14.1 13.0 - 17.0 g/dL   HCT 44.3  39.0 - 52.0 %   MCV 79.5 (L) 80.0 - 100.0 fL   MCH 25.3 (L) 26.0 - 34.0 pg   MCHC 31.8 30.0 - 36.0 g/dL   RDW 16.113.8 09.611.5 - 04.515.5 %   Platelets 182 150 - 400 K/uL   nRBC 0.0 0.0 - 0.2 %   Neutrophils Relative % 79 %   Neutro Abs 6.6 1.7 - 7.7 K/uL   Lymphocytes Relative 14 %   Lymphs Abs 1.1 0.7 - 4.0 K/uL   Monocytes Relative 7 %   Monocytes Absolute 0.6 0.1 - 1.0 K/uL   Eosinophils Relative 0 %   Eosinophils Absolute 0.0 0.0 - 0.5 K/uL   Basophils Relative 0 %   Basophils Absolute 0.0 0.0 - 0.1 K/uL   Immature Granulocytes 0 %   Abs Immature Granulocytes 0.03 0.00 - 0.07 K/uL    Comment: Performed at [facility], [address] [place], [place] [address]  Lipase, blood     Status: None   Collection Time: 12/19/18  9:36 AM  Result Value Ref Range   Lipase 41 11 - 51 U/L    Comment: Performed at [facility], [address] [place], [place] [address]  SARS Coronavirus 2 (CEPHEID- Performed in  [facility] hospital lab), Hosp Order     Status: Abnormal   Collection Time: 12/19/18 10:01 AM   Specimen: Nasopharyngeal Swab  Result Value Ref Range   SARS Coronavirus 2 POSITIVE (A) NEGATIVE    Comment: CRITICAL RESULT CALLED TO, READ BACK BY AND VERIFIED WITH: RN Irwin BrakemanAUDREY M. [id number] FCP (NOTE) If result is NEGATIVE SARS-CoV-2 target nucleic acids are NOT DETECTED. The SARS-CoV-2 RNA is generally detectable in upper and lower  respiratory specimens during the acute phase of infection. The lowest  concentration of SARS-CoV-2 viral copies this assay can detect is 250  copies / mL. A negative result does not preclude SARS-CoV-2 infection  and should not be used as the sole basis for treatment or other  patient management decisions.  A negative result may occur with  improper specimen collection / handling, submission of specimen other  than nasopharyngeal swab, presence of viral mutation(s) within the  areas targeted by this assay, and inadequate number of viral copies  (<250 copies / mL). A negative result must be combined with clinical  observations, patient history, and epidemiological information. If result is POSITIVE SARS-CoV-2 target nucleic acids are DETECTED.  The SARS-CoV-2 RNA is generally detectable in upper and lower  respiratory specimens during the acute phase of infection.  Positive  results are indicative of active infection with SARS-CoV-2.  Clinical  correlation with patient history and other diagnostic information is  necessary to determine patient infection status.  Positive results do  not rule out bacterial infection or co-infection with other viruses. If result is PRESUMPTIVE POSTIVE SARS-CoV-2 nucleic acids MAY BE PRESENT.   A presumptive positive result was obtained on the submitted specimen  and confirmed on repeat testing.  While 2019 novel coronavirus  (SARS-CoV-2) nucleic acids may be present in the submitted sample  additional confirmatory testing  may be necessary for epidemiological  and / or clinical management purposes  to differentiate between  SARS-CoV-2 and other Sarbecovirus currently known to infect humans.  If clinically indicated additional testing with an alternate test  methodology [phone]([id number])  is advised. The SARS-CoV-2 RNA is generally  detectable in upper and lower respiratory specimens during the acute  phase of infection. The expected result is  Negative. Fact Sheet for Patients:  StrictlyIdeas.no Fact Sheet for Healthcare Providers: BankingDealers.co.za This test is not yet approved or cleared by the [place] FDA and has been authorized for detection and/or diagnosis of SARS-CoV-2 by FDA under an Emergency Use Authorization (EUA).  This EUA will remain in effect (meaning this test can be used) for the duration of the COVID-19 declaration under Section 564(b)(1) of the Act, 21 U.S.C. section 360bbb-3(b)(1), unless the authorization is terminated or revoked sooner. Performed at [facility], [place] [address] [place], [place] [address]     Ct Abdomen Pelvis W Contrast  Result Date: 12/19/2018 CLINICAL DATA:  Shortness of breath, abdominal cramping and fever. Coronavirus positive test today. EXAM: CT ABDOMEN AND PELVIS WITH CONTRAST TECHNIQUE: Multidetector CT imaging of the abdomen and pelvis was performed using the standard protocol following bolus administration of intravenous contrast. CONTRAST:  169mL OMNIPAQUE IOHEXOL 300 MG/ML  SOLN COMPARISON:  None. FINDINGS: Lower chest: Extensive peripheral patchy ground-glass airspace opacities are seen in both lungs, consistent with multifocal atypical pneumonia. Hepatobiliary: Hepatic steatosis. Normal appearance of the gallbladder. Pancreas: Unremarkable. No pancreatic ductal dilatation or surrounding inflammatory changes. Spleen: Normal in size without focal abnormality. Adrenals/Urinary Tract: Adrenal glands are  unremarkable. Kidneys are normal, without renal calculi, focal lesion, or hydronephrosis. Bladder is unremarkable. Stomach/Bowel: Normal appearance of the stomach and small bowel. The appendix is thickened inflamed with marked periappendiceal inflammatory stranding. Small appendicoliths seen within the tip of the appendix. No evidence of rupture or abscess formation. The colon is normal. Vascular/Lymphatic: Aortic atherosclerosis. No enlarged abdominal or pelvic lymph nodes. Reproductive: Prostate is unremarkable. Other: No abdominal wall hernia or abnormality. No abdominopelvic ascites. Musculoskeletal: No acute or significant osseous findings. IMPRESSION: 1. Acute appendicitis without evidence of rupture or abscess formation. 2. Bilateral multifocal peripheral ground-glass airspace disease. There are a spectrum of findings in the lungs which can be seen with acute atypical infection (as well as other non-infectious etiologies). In particular, viral pneumonia (including COVID-19) should be considered in the appropriate clinical setting. 3. Hepatic steatosis. These results were called by telephone at the time of interpretation on 12/19/2018 at 2:17 pm to Dr. Virgel Manifold , who verbally acknowledged these results. Electronically Signed   By: Fidela Salisbury M.D.   On: 12/19/2018 14:19   Dg Chest Portable 1 View  Result Date: 12/19/2018 CLINICAL DATA:  Fever and shortness of breath EXAM: PORTABLE CHEST 1 VIEW COMPARISON:  December 09, 2017 FINDINGS: There is no edema or consolidation. There is scarring in the right upper lobe, stable. Heart size and pulmonary vascularity are normal. No adenopathy. There is degenerative change in the thoracic spine. IMPRESSION: No edema or consolidation. Stable scarring right upper lobe. Stable cardiac silhouette. Electronically Signed   By: Lowella Grip III M.D.   On: 12/19/2018 09:56    Review of Systems  Constitutional: Positive for chills, fever and malaise/fatigue.   Respiratory: Positive for shortness of breath.   Gastrointestinal: Positive for abdominal pain. Negative for diarrhea, nausea and vomiting.  All other systems reviewed and are negative.  Blood pressure 139/81, pulse 82, temperature 99.8 F (37.7 C), temperature source Oral, resp. rate (!) 26, height 6\' 4"  (1.93 m), weight 113.4 kg, SpO2 (!) 89 %. Physical Exam  Constitutional: He is oriented to person, place, and time. He appears well-developed and well-nourished. He appears ill.  HENT:  Head: Normocephalic and atraumatic.  Eyes: Pupils are equal, round, and reactive to light. EOM are normal. No scleral icterus.  Neck: Normal range of motion. Neck supple.  Cardiovascular: Normal rate and regular rhythm.  Respiratory: Tachypnea noted. No apnea and no bradypnea.  GI: There is abdominal tenderness. There is tenderness at McBurney's point. There is no rigidity.  Musculoskeletal: Normal range of motion.  Neurological: He is alert and oriented to person, place, and time.  Skin: No rash noted. No pallor.  Psychiatric: He has a normal mood and affect. His behavior is normal.    Assessment/Plan: Acute appendicitis uncomplicated with COVID-19 pneumonia  Defer management in the literature, which is limited, is nonoperative management since these patients have a high risk of lung damage from intubation and subsequent progression of the pneumonia.  Mortality has been as high as 30% in some studies.  Recommendations are to manage nonoperatively when reasonable in the circumstances.  Recommend Zosyn 3.375 g IV every 8.  Success rates for management appendicitis are 85% to 90%.  The goal would be interval appendectomy or observation once he recovers from COVID-19.  Discussed with the EDP and the patient.  Lanette Ell A Ernesteen Mihalic 12/19/2018, 4:06 PM

## 2018-12-19 NOTE — Progress Notes (Addendum)
I spoke with the patient's wife about patient's condition, plan of care and all questions were addressed.  

## 2018-12-19 NOTE — ED Notes (Signed)
ED TO INPATIENT HANDOFF REPORT  ED Nurse Name and Phone #: [phone] Threasa Beards, RN  S Name/Age/Gender Raymond Harrell 53 y.o. male Room/Bed: 021C/021C  Code Status   Code Status: Full Code  Home/SNF/Other Home Patient oriented to: self, place, time and situation Is this baseline? Yes   Triage Complete: Triage complete  Chief Complaint covid 19 /sob   Triage Note PT reports fever started on Thursday and [place]. Pt arrived with N95 mask on . Pt anxious  And O2 sats are 95% room air.    Allergies No Known Allergies  Level of Care/Admitting Diagnosis ED Disposition    ED Disposition Condition Comment   Admit  Hospital Area: [place] [[id number]]  Level of Care: Med-Surg [16]  Covid Evaluation: Confirmed COVID Positive  Diagnosis: Appendicitis [[id number]]  Admitting Physician: Tawni Millers [[id number]]  Attending Physician: Tawni Millers [[id number]]  Estimated length of stay: 3 - 4 days  Certification:: I certify this patient will need inpatient services for at least 2 midnights  PT Class (Do Not Modify): Inpatient [101]  PT Acc Code (Do Not Modify): Private [1]       B Medical/Surgery History Past Medical History:  Diagnosis Date  . Abnormal CT scan    a. CT angio incidentally noted nodular opacity in upper abdomen felt lymph node vs part of pancreas, felt to be benign lesion with stable appearance.  . Chest pain    a. Negative nuc 2010 (abnormal baseline EKG). b. Adm with pleuritic CP 02/2012: negative cardiac enzymes, CTA negative for PE  but did show bronchiectasis/scarring RUL.  . Diabetes mellitus ([place])    Diagnosed 02/2012  . Elevated blood pressure    Noted 02/2012  . Elevated LDL cholesterol level    Noted 02/2012   Past Surgical History:  Procedure Laterality Date  . FRACTURE SURGERY    . HAND TENDON SURGERY       A IV Location/Drains/Wounds Patient Lines/Drains/Airways Status   Active Line/Drains/Airways    Name:    Placement date:   Placement time:   Site:   Days:   Peripheral IV 12/19/18 Left;Anterior;Upper Forearm   12/19/18    0925    Forearm   less than 1          Intake/Output Last 24 hours  Intake/Output Summary (Last 24 hours) at 12/19/2018 1946 Last data filed at 12/19/2018 1726 Gross per 24 hour  Intake 1050 ml  Output -  Net 1050 ml    Labs/Imaging Results for orders placed or performed during the hospital encounter of 12/19/18 (from the past 48 hour(s))  Comprehensive metabolic panel     Status: Abnormal   Collection Time: 12/19/18  9:36 AM  Result Value Ref Range   Sodium 132 (L) 135 - 145 mmol/L   Potassium 3.5 3.5 - 5.1 mmol/L   Chloride 98 98 - 111 mmol/L   CO2 22 22 - 32 mmol/L   Glucose, Bld 132 (H) 70 - 99 mg/dL   BUN 11 6 - 20 mg/dL   Creatinine, Ser 1.02 0.61 - 1.24 mg/dL   Calcium 9.0 8.9 - 10.3 mg/dL   Total Protein 8.0 6.5 - 8.1 g/dL   Albumin 3.6 3.5 - 5.0 g/dL   AST 55 (H) 15 - 41 U/L   ALT 35 0 - 44 U/L   Alkaline Phosphatase 56 38 - 126 U/L   Total Bilirubin 0.9 0.3 - 1.2 mg/dL   GFR calc non Af Amer >60 >60  mL/min   GFR calc Af Amer >60 >60 mL/min   Anion gap 12 5 - 15    Comment: Performed at [facility], [address] [place], [place] [address]  CBC with Differential     Status: Abnormal   Collection Time: 12/19/18  9:36 AM  Result Value Ref Range   WBC 8.4 4.0 - 10.5 K/uL   RBC 5.57 4.22 - 5.81 MIL/uL   Hemoglobin 14.1 13.0 - 17.0 g/dL   HCT 60.444.3 54.039.0 - 98.152.0 %   MCV 79.5 (L) 80.0 - 100.0 fL   MCH 25.3 (L) 26.0 - 34.0 pg   MCHC 31.8 30.0 - 36.0 g/dL   RDW 19.113.8 47.811.5 - 29.515.5 %   Platelets 182 150 - 400 K/uL   nRBC 0.0 0.0 - 0.2 %   Neutrophils Relative % 79 %   Neutro Abs 6.6 1.7 - 7.7 K/uL   Lymphocytes Relative 14 %   Lymphs Abs 1.1 0.7 - 4.0 K/uL   Monocytes Relative 7 %   Monocytes Absolute 0.6 0.1 - 1.0 K/uL   Eosinophils Relative 0 %   Eosinophils Absolute 0.0 0.0 - 0.5 K/uL   Basophils Relative 0 %   Basophils Absolute 0.0  0.0 - 0.1 K/uL   Immature Granulocytes 0 %   Abs Immature Granulocytes 0.03 0.00 - 0.07 K/uL    Comment: Performed at [facility], [address] [place], [place] [address]  Lipase, blood     Status: None   Collection Time: 12/19/18  9:36 AM  Result Value Ref Range   Lipase 41 11 - 51 U/L    Comment: Performed at [facility], [address] [place], [place] [address]  SARS Coronavirus 2 (CEPHEID- Performed in [facility] hospital lab), Hosp Order     Status: Abnormal   Collection Time: 12/19/18 10:01 AM   Specimen: Nasopharyngeal Swab  Result Value Ref Range   SARS Coronavirus 2 POSITIVE (A) NEGATIVE    Comment: CRITICAL RESULT CALLED TO, READ BACK BY AND VERIFIED WITH: RN Irwin BrakemanAUDREY M. [id number] FCP (NOTE) If result is NEGATIVE SARS-CoV-2 target nucleic acids are NOT DETECTED. The SARS-CoV-2 RNA is generally detectable in upper and lower  respiratory specimens during the acute phase of infection. The lowest  concentration of SARS-CoV-2 viral copies this assay can detect is 250  copies / mL. A negative result does not preclude SARS-CoV-2 infection  and should not be used as the sole basis for treatment or other  patient management decisions.  A negative result may occur with  improper specimen collection / handling, submission of specimen other  than nasopharyngeal swab, presence of viral mutation(s) within the  areas targeted by this assay, and inadequate number of viral copies  (<250 copies / mL). A negative result must be combined with clinical  observations, patient history, and epidemiological information. If result is POSITIVE SARS-CoV-2 target nucleic acids are DETECTED.  The SARS-CoV-2 RNA is generally detectable in upper and lower  respiratory specimens during the acute phase of infection.  Positive  results are indicative of active infection with SARS-CoV-2.  Clinical  correlation with patient history and other diagnostic information is  necessary to  determine patient infection status.  Positive results do  not rule out bacterial infection or co-infection with other viruses. If result is PRESUMPTIVE POSTIVE SARS-CoV-2 nucleic acids MAY BE PRESENT.   A presumptive positive result was obtained on the submitted specimen  and confirmed on repeat testing.  While 2019 novel coronavirus  (SARS-CoV-2) nucleic acids may be present in the submitted sample  additional confirmatory testing may be necessary for epidemiological  and / or clinical management purposes  to differentiate between  SARS-CoV-2 and other Sarbecovirus currently known to infect humans.  If clinically indicated additional testing with an alternate test  methodology [phone])  is advised. The SARS-CoV-2 RNA is generally  detectable in upper and lower respiratory specimens during the acute  phase of infection. The expected result is Negative. Fact Sheet for Patients:  BoilerBrush.com.cy Fact Sheet for Healthcare Providers: https://pope.com/ This test is not yet approved or cleared by the [place] FDA and has been authorized for detection and/or diagnosis of SARS-CoV-2 by FDA under an Emergency Use Authorization (EUA).  This EUA will remain in effect (meaning this test can be used) for the duration of the COVID-19 declaration under Section 564(b)(1) of the Act, 21 U.S.C. section 360bbb-3(b)(1), unless the authorization is terminated or revoked sooner. Performed at [facility], [address] [place], [place] [address]   Urinalysis, Routine w reflex microscopic     Status: Abnormal   Collection Time: 12/19/18  6:26 PM  Result Value Ref Range   Color, Urine YELLOW YELLOW   APPearance CLEAR CLEAR   Specific Gravity, Urine 1.035 (H) 1.005 - 1.030   pH 6.0 5.0 - 8.0   Glucose, UA NEGATIVE NEGATIVE mg/dL   Hgb urine dipstick NEGATIVE NEGATIVE   Bilirubin Urine NEGATIVE NEGATIVE   Ketones, ur NEGATIVE NEGATIVE mg/dL    Protein, ur 30 (A) NEGATIVE mg/dL   Nitrite NEGATIVE NEGATIVE   Leukocytes,Ua NEGATIVE NEGATIVE   WBC, UA 0-5 0 - 5 WBC/hpf   Bacteria, UA NONE SEEN NONE SEEN   Mucus PRESENT     Comment: Performed at [facility], [address], [place], [place] [address]   Ct Abdomen Pelvis W Contrast  Result Date: 12/19/2018 CLINICAL DATA:  Shortness of breath, abdominal cramping and fever. Coronavirus positive test today. EXAM: CT ABDOMEN AND PELVIS WITH CONTRAST TECHNIQUE: Multidetector CT imaging of the abdomen and pelvis was performed using the standard protocol following bolus administration of intravenous contrast. CONTRAST:  OMNIPAQUE IOHEXOL 300 MG/ML  SOLN COMPARISON:  None. FINDINGS: Lower chest: Extensive peripheral patchy ground-glass airspace opacities are seen in both lungs, consistent with multifocal atypical pneumonia. Hepatobiliary: Hepatic steatosis. Normal appearance of the gallbladder. Pancreas: Unremarkable. No pancreatic ductal dilatation or surrounding inflammatory changes. Spleen: Normal in size without focal abnormality. Adrenals/Urinary Tract: Adrenal glands are unremarkable. Kidneys are normal, without renal calculi, focal lesion, or hydronephrosis. Bladder is unremarkable. Stomach/Bowel: Normal appearance of the stomach and small bowel. The appendix is thickened inflamed with marked periappendiceal inflammatory stranding. Small appendicoliths seen within the tip of the appendix. No evidence of rupture or abscess formation. The colon is normal. Vascular/Lymphatic: Aortic atherosclerosis. No enlarged abdominal or pelvic lymph nodes. Reproductive: Prostate is unremarkable. Other: No abdominal wall hernia or abnormality. No abdominopelvic ascites. Musculoskeletal: No acute or significant osseous findings. IMPRESSION: 1. Acute appendicitis without evidence of rupture or abscess formation. 2. Bilateral multifocal peripheral ground-glass airspace disease. There are a spectrum of  findings in the lungs which can be seen with acute atypical infection (as well as other non-infectious etiologies). In particular, viral pneumonia (including COVID-19) should be considered in the appropriate clinical setting. 3. Hepatic steatosis. These results were called by telephone at the time of interpretation on 12/19/2018 at 2:17 pm to Dr. Raeford Razor , who verbally acknowledged  these results. Electronically Signed   By: Ted Mcalpineobrinka  Dimitrova M.D.   On: 12/19/2018 14:19   Dg Chest Portable 1 View  Result Date: 12/19/2018 CLINICAL DATA:  Fever and shortness of breath EXAM: PORTABLE CHEST 1 VIEW COMPARISON:  December 09, 2017 FINDINGS: There is no edema or consolidation. There is scarring in the right upper lobe, stable. Heart size and pulmonary vascularity are normal. No adenopathy. There is degenerative change in the thoracic spine. IMPRESSION: No edema or consolidation. Stable scarring right upper lobe. Stable cardiac silhouette. Electronically Signed   By: Bretta BangWilliam  Woodruff III M.D.   On: 12/19/2018 09:56    Pending Labs Unresulted Labs (From admission, onward)    Start     Ordered   12/26/18 0500  Creatinine, serum  (enoxaparin (LOVENOX)    CrCl >/= 30 ml/min)  Weekly,   R    Comments: while on enoxaparin therapy    12/19/18 1731   12/20/18 0500  Basic metabolic panel  Tomorrow morning,   R     12/19/18 1731   12/20/18 0500  CBC  Tomorrow morning,   R     12/19/18 1731   12/20/18 0500  CK  Daily,   R     12/19/18 1731   12/20/18 0500  C-reactive protein  Daily,   R     12/19/18 1731   12/20/18 0500  D-dimer, quantitative (not at [facility])  Daily,   R     12/19/18 1731   12/20/18 0500  Ferritin  Daily,   R     12/19/18 1731   12/19/18 1732  HIV antibody (Routine Testing)  Once,   STAT     12/19/18 1731   12/19/18 1732  CBC  (enoxaparin (LOVENOX)    CrCl >/= 30 ml/min)  Once,   STAT    Comments: Baseline for enoxaparin therapy IF NOT ALREADY DRAWN.  Notify MD if PLT < 100 K.    12/19/18  1731   12/19/18 1732  Creatinine, serum  (enoxaparin (LOVENOX)    CrCl >/= 30 ml/min)  Once,   STAT    Comments: Baseline for enoxaparin therapy IF NOT ALREADY DRAWN.    12/19/18 1731   12/19/18 1732  ABO/Rh  Once,   STAT     12/19/18 1731          Vitals/Pain Today's Vitals   12/19/18 1845 12/19/18 1900 12/19/18 1919 12/19/18 1930  BP: (!) 149/78 (!) 147/83  (!) 146/85  Pulse: 91   92  Resp: (!) 30 (!) 31  (!) 32  Temp:   (!) 101.2 F (38.4 C)   TempSrc:   Oral   SpO2: 100%   94%  Weight:      Height:      PainSc:        Isolation Precautions Airborne and Contact precautions  Medications Medications  rosuvastatin (CRESTOR) tablet 20 mg (has no administration in time range)  cholecalciferol (VITAMIN D3) tablet 1,000 Units (has no administration in time range)  fish oil-omega-3 fatty acids capsule 1 g (has no administration in time range)  enoxaparin (LOVENOX) injection 40 mg (has no administration in time range)  acetaminophen (TYLENOL) tablet 650 mg (650 mg Oral Given 12/19/18 1938)    Or  acetaminophen (TYLENOL) suppository 650 mg ( Rectal See Alternative 12/19/18 1938)  ondansetron (ZOFRAN) tablet 4 mg ( Oral See Alternative 12/19/18 1759)    Or  ondansetron (ZOFRAN) injection 4 mg (4 mg Intravenous Given 12/19/18 1759)  lactated ringers infusion ( Intravenous New Bag/Given 12/19/18 1807)  methylPREDNISolone sodium succinate (SOLU-MEDROL) 125 mg/2 mL injection 60 mg (60 mg Intravenous Given 12/19/18 1802)  insulin aspart (novoLOG) injection 0-9 Units (has no administration in time range)  piperacillin-tazobactam (ZOSYN) IVPB 3.375 g (has no administration in time range)  remdesivir 200 mg in sodium chloride 0.9 % 250 mL IVPB (has no administration in time range)    Followed by  remdesivir 100 mg in sodium chloride 0.9 % 250 mL IVPB (has no administration in time range)  morphine 2 MG/ML injection 1 mg (1 mg Intravenous Given 12/19/18 1756)  pantoprazole (PROTONIX)  injection 40 mg (40 mg Intravenous Given 12/19/18 1759)  HYDROmorphone (DILAUDID) injection 1 mg (1 mg Intravenous Given 12/19/18 0955)  acetaminophen (TYLENOL) tablet 650 mg (650 mg Oral Given 12/19/18 1310)  iohexol (OMNIPAQUE) 300 MG/ML solution 100 mL (100 mLs Intravenous Contrast Given 12/19/18 1320)  piperacillin-tazobactam (ZOSYN) IVPB 3.375 g (0 g Intravenous Stopped 12/19/18 1726)  morphine 4 MG/ML injection 4 mg (4 mg Intravenous Given 12/19/18 1516)    Mobility walks Low fall risk   Focused Assessments Pulmonary Assessment Handoff:  Lung sounds: Bilateral Breath Sounds: Diminished O2 Device: Nasal Cannula O2 Flow Rate (L/min): 2 L/min      R Recommendations: See Admitting Provider Note  Report given to:   Additional Notes:  covid positive

## 2018-12-19 NOTE — ED Notes (Signed)
RN attempted to call report. Told they needed to call back.

## 2018-12-19 NOTE — ED Notes (Addendum)
Rn attempted to call report. Told I needed to call back.

## 2018-12-19 NOTE — Progress Notes (Signed)
The patient is admitted with pneumonia d/t covid 19. A & O x 4. Denied any acute pain at this time. Patient oriented to his room, staff, ascom/call call. Plan of care reviewed with patient and wife on video call from patient's phone. Wife and patient verbalized understanding and voiced no concern. Will continue to monitor.

## 2018-12-19 NOTE — ED Notes (Signed)
[phone]- wife. Pt here for shortness of breath, abdominal cramping fevers. Wife works at hospital.

## 2018-12-19 NOTE — ED Provider Notes (Signed)
[facility] EMERGENCY DEPARTMENT Provider Note   CSN: [id number] Arrival date & time: 12/19/18  0844     History   Chief Complaint Chief Complaint  Patient presents with   Fever   Shortness of Breath    HPI Raymond Harrell is a 53 y.o. male.     HPI   52yM with cough and dyspnea. Onset Thursday. Persistent since. Associated fever. Nausea. No vomiting. Abdominal pain since yesterday which he describes as severe cramps. Diffuse. No urinary complaints.   Past Medical History:  Diagnosis Date   Abnormal CT scan    a. CT angio incidentally noted nodular opacity in upper abdomen felt lymph node vs part of pancreas, felt to be benign lesion with stable appearance.   Chest pain    a. Negative nuc 2010 (abnormal baseline EKG). b. Adm with pleuritic CP 02/2012: negative cardiac enzymes, CTA negative for PE  but did show bronchiectasis/scarring RUL.   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 Authorizing Provider  aspirin 81 MG EC tablet Take 81 mg by mouth daily.    Yes [provider]  cholecalciferol (VITAMIN D3) 25 MCG (1000 UT) tablet Take 1,000 Units by mouth daily.   Yes [provider]  fish oil-omega-3 fatty acids 1000 MG capsule Take 1 g by mouth daily.   Yes [provider]  lisinopril (PRINIVIL,ZESTRIL) 20 MG tablet Take 1 tablet (20 mg total) by mouth daily. 12/09/17  Yes Daleen Bo, MD    Multiple Vitamins-Minerals (CENTRUM SILVER ULTRA MENS PO) Take 1 tablet by mouth daily.   Yes [provider]  naproxen (NAPROSYN) 375 MG tablet Take 1 tablet (375 mg total) by mouth 2 (two) times daily. 09/17/16  Yes Virgel Manifold, MD  rosuvastatin (CRESTOR) 20 MG tablet Take 20 mg by mouth daily. 09/09/18  Yes [provider]  vitamin C (ASCORBIC ACID) 500 MG tablet Take 500 mg by mouth daily.   Yes [provider]  zinc gluconate 50 MG tablet Take 50 mg by mouth daily.   Yes [provider]  Clotrimazole 1 % OINT Apply 1 application topically 2 (two) times daily. Apply copiously twice daily. Then apply steroid ointment to the bottom of the feet only. Patient not taking: Reported on 12/19/2018 08/01/15   Tereasa Coop, PA-C  tadalafil (CIALIS) 10 MG tablet Take 1 tablet (10 mg total) by mouth daily as needed for erectile dysfunction. Patient not taking: Reported on 12/19/2018 11/20/15   Nolon Rod, DO  traMADol (ULTRAM) 50 MG tablet Take 1 tablet (50 mg total) by mouth every 6 (six) hours as needed. Patient not taking: Reported on 12/19/2018 09/17/16   Virgel Manifold, MD    Family History Family History  Problem Relation Age of Onset   Heart attack Father        MI in his 55's    Social  History Social History   Tobacco Use   Smoking status: Former Smoker   Smokeless tobacco: Never Used  Substance Use Topics   Alcohol use: Yes    Comment: 1 beer or 1 shot of liquor daily   Drug use: No     Allergies   Patient has no known allergies.   Review of Systems Review of Systems  All systems reviewed and negative, other than as noted in HPI.  Physical Exam Updated Vital Signs BP (!) 154/80    Pulse 99    Temp 99.8 F (37.7 C) (Oral)    Resp (!) 26    Ht 6\' 4"  (1.93 m)    Wt 113.4 kg    SpO2 95%    BMI 30.43 kg/m   Physical Exam Vitals signs and nursing note reviewed.  Constitutional:      General: He is not in acute distress.     Appearance: He is well-developed.  HENT:     Head: Normocephalic and atraumatic.  Eyes:     General:        Right eye: No discharge.        Left eye: No discharge.     Conjunctiva/sclera: Conjunctivae normal.  Neck:     Musculoskeletal: Neck supple.  Cardiovascular:     Rate and Rhythm: Normal rate and regular rhythm.     Heart sounds: Normal heart sounds. No murmur. No friction rub. No gallop.   Pulmonary:     Effort: Pulmonary effort is normal. No respiratory distress.     Breath sounds: Normal breath sounds.  Abdominal:     General: There is no distension.     Palpations: Abdomen is soft.     Tenderness: There is abdominal tenderness.     Comments: Mild to moderate R upper and lower quadrant tenderness w/o rebound or guarding  Musculoskeletal:        General: No tenderness.  Skin:    General: Skin is warm and dry.  Neurological:     Mental Status: He is alert.  Psychiatric:        Behavior: Behavior normal.        Thought Content: Thought content normal.    ED Treatments / Results  Labs (all labs ordered are listed, but only abnormal results are displayed) Labs Reviewed  SARS CORONAVIRUS 2 (HOSPITAL ORDER, PERFORMED IN [facility]) - Abnormal; Notable for the following components:      Result Value   SARS Coronavirus 2 POSITIVE (*)    All other components within normal limits  COMPREHENSIVE METABOLIC PANEL - Abnormal; Notable for the following components:   Sodium 132 (*)    Glucose, Bld 132 (*)    AST 55 (*)    All other components within normal limits  CBC WITH DIFFERENTIAL/PLATELET - Abnormal; Notable for the following components:   MCV 79.5 (*)    MCH 25.3 (*)    All other components within normal limits  URINALYSIS, ROUTINE W REFLEX MICROSCOPIC - Abnormal; Notable for the following components:   Specific Gravity, Urine 1.035 (*)    Protein, ur 30 (*)    All other components within normal limits  CBC - Abnormal; Notable for the following  components:   MCV 78.4 (*)    MCH 25.7 (*)    All other components within normal limits  BASIC METABOLIC PANEL - Abnormal; Notable for the following components:   Glucose, Bld 156 (*)    Calcium 8.8 (*)  All other components within normal limits  CBC - Abnormal; Notable for the following components:   MCV 79.1 (*)    MCH 25.4 (*)    All other components within normal limits  CK - Abnormal; Notable for the following components:   Total CK 2,430 (*)    All other components within normal limits  C-REACTIVE PROTEIN - Abnormal; Notable for the following components:   CRP 16.4 (*)    All other components within normal limits  D-DIMER, QUANTITATIVE (NOT AT [facility]) - Abnormal; Notable for the following components:   D-Dimer, Quant 0.65 (*)    All other components within normal limits  FERRITIN - Abnormal; Notable for the following components:   Ferritin 624 (*)    All other components within normal limits  GLUCOSE, CAPILLARY - Abnormal; Notable for the following components:   Glucose-Capillary 169 (*)    All other components within normal limits  GLUCOSE, CAPILLARY - Abnormal; Notable for the following components:   Glucose-Capillary 157 (*)    All other components within normal limits  GLUCOSE, CAPILLARY - Abnormal; Notable for the following components:   Glucose-Capillary 184 (*)    All other components within normal limits  GLUCOSE, CAPILLARY - Abnormal; Notable for the following components:   Glucose-Capillary 146 (*)    All other components within normal limits  GLUCOSE, CAPILLARY - Abnormal; Notable for the following components:   Glucose-Capillary 204 (*)    All other components within normal limits  GLUCOSE, CAPILLARY - Abnormal; Notable for the following components:   Glucose-Capillary 151 (*)    All other components within normal limits  LIPASE, BLOOD  HIV ANTIBODY (ROUTINE TESTING W REFLEX)  CREATININE, SERUM  ABO/RH    EKG EKG Interpretation  Date/Time:  Sunday  December 19 2018 09:25:16 EDT Ventricular Rate:  100 PR Interval:    QRS Duration: 90 QT Interval:  350 QTC Calculation: 452 R Axis:   40 Text Interpretation:  Sinus tachycardia Confirmed by Raeford RazorKohut, Marvel Sapp [id number]) on 12/19/2018 10:34:51 AM   Radiology Ct Abdomen Pelvis W Contrast  Result Date: 12/19/2018 CLINICAL DATA:  Shortness of breath, abdominal cramping and fever. Coronavirus positive test today. EXAM: CT ABDOMEN AND PELVIS WITH CONTRAST TECHNIQUE: Multidetector CT imaging of the abdomen and pelvis was performed using the standard protocol following bolus administration of intravenous contrast. CONTRAST:  100mL OMNIPAQUE IOHEXOL 300 MG/ML  SOLN COMPARISON:  None. FINDINGS: Lower chest: Extensive peripheral patchy ground-glass airspace opacities are seen in both lungs, consistent with multifocal atypical pneumonia. Hepatobiliary: Hepatic steatosis. Normal appearance of the gallbladder. Pancreas: Unremarkable. No pancreatic ductal dilatation or surrounding inflammatory changes. Spleen: Normal in size without focal abnormality. Adrenals/Urinary Tract: Adrenal glands are unremarkable. Kidneys are normal, without renal calculi, focal lesion, or hydronephrosis. Bladder is unremarkable. Stomach/Bowel: Normal appearance of the stomach and small bowel. The appendix is thickened inflamed with marked periappendiceal inflammatory stranding. Small appendicoliths seen within the tip of the appendix. No evidence of rupture or abscess formation. The colon is normal. Vascular/Lymphatic: Aortic atherosclerosis. No enlarged abdominal or pelvic lymph nodes. Reproductive: Prostate is unremarkable. Other: No abdominal wall hernia or abnormality. No abdominopelvic ascites. Musculoskeletal: No acute or significant osseous findings. IMPRESSION: 1. Acute appendicitis without evidence of rupture or abscess formation. 2. Bilateral multifocal peripheral ground-glass airspace disease. There are a spectrum of findings in the lungs  which can be seen with acute atypical infection (as well as other non-infectious etiologies). In particular, viral pneumonia (including COVID-19) should be considered in the appropriate  clinical setting. 3. Hepatic steatosis. These results were called by telephone at the time of interpretation on 12/19/2018 at 2:17 pm to Dr. Raeford RazorSTEPHEN Ashiya Kinkead , who verbally acknowledged these results. Electronically Signed   By: Ted Mcalpineobrinka  Dimitrova M.D.   On: 12/19/2018 14:19   Dg Chest Portable 1 View  Result Date: 12/19/2018 CLINICAL DATA:  Fever and shortness of breath EXAM: PORTABLE CHEST 1 VIEW COMPARISON:  December 09, 2017 FINDINGS: There is no edema or consolidation. There is scarring in the right upper lobe, stable. Heart size and pulmonary vascularity are normal. No adenopathy. There is degenerative change in the thoracic spine. IMPRESSION: No edema or consolidation. Stable scarring right upper lobe. Stable cardiac silhouette. Electronically Signed   By: Bretta BangWilliam  Woodruff III M.D.   On: 12/19/2018 09:56    Procedures Procedures (including critical care time)  Medications Ordered in ED Medications  0.9 %  sodium chloride infusion ( Intravenous Stopped 12/19/18 1147)  acetaminophen (TYLENOL) tablet 650 mg (has no administration in time range)  HYDROmorphone (DILAUDID) injection 1 mg (1 mg Intravenous Given 12/19/18 0955)     Initial Impression / Assessment and Plan / ED Course  I have reviewed the triage vital signs and the nursing notes.  Pertinent labs & imaging results that were available during my care of the patient were reviewed by me and considered in my medical decision making (see chart for details).     52yM with respiratory and GI symptoms. SARS-CoV-2 positive. Initially placed on oxygen but then stopped and O2 sats remain good on RA. Initial impression that GI symptoms could be related to COVID but R sided tenderness on exam. Subsequent CT showing acute appendicitis. Zosyn ordered. Pt continues to  decline pain medication. Surgery paged. Will discuss with medicine for admission given SARS-CoV-2 positive and symptomatic.    Raymond Harrell was evaluated in Emergency Department on 12/19/2018 for the symptoms described in the history of present illness. He was evaluated in the context of the global COVID-19 pandemic, which necessitated consideration that the patient might be at risk for infection with the SARS-CoV-2 virus that causes COVID-19. Institutional protocols and algorithms that pertain to the evaluation of patients at risk for COVID-19 are in a state of rapid change based on information released by regulatory bodies including the CDC and federal and state organizations. These policies and algorithms were followed during the patient's care in the ED.   Final Clinical Impressions(s) / ED Diagnoses   Final diagnoses:  Acute respiratory disease due to COVID-19 virus  Acute appendicitis, unspecified acute appendicitis type    ED Discharge Orders    None       Raeford RazorKohut, Constantino Starace, MD 12/21/18 [id number]

## 2018-12-19 NOTE — ED Triage Notes (Signed)
PT reports fever started on Thursday and [place]. Pt arrived with N95 mask on . Pt anxious  And O2 sats are 95% room air.

## 2018-12-19 NOTE — H&P (Signed)
History and Physical    Raymond Harrell ZOX:[id number] DOB: [birth year] DOA: 12/19/2018  PCP: Patient, No Pcp Per   Patient coming from: Home   Chief Complaint: Abdominal pain.  HPI: Raymond Harrell is a 53 y.o. male with medical history significant of diet-controlled type 2 diabetes mellitus, hypertension and dyslipidemia who presents with abdominal pain.  He reports not feeling well for the last 10 days, consistent with nausea, vomiting, diarrhea, generalized malaise, decreased p.o. intake, and intermittent fevers.  Last night he developed severe lower abdominal pain, sharp in nature, no radiation, no improving or worsening factors.  Due to persistent pain he presented to the hospital.  Denies any frank dyspnea, cough or sore throat.  ED Course: Patient was noted ill looking appearing, afebrile, but clearly dehydrated.  He received IV fluids, IV analgesics and IV antibiotics.  His work-up has been positive for appendicitis and COVID-19.  Surgical team has consulted, recommendation for medical therapy, [facility].  Review of Systems:  1. General: positive fevers and chills, no weight gain or weight loss 2. ENT: No runny nose or sore throat, no hearing disturbances 3. Pulmonary: No dyspnea, cough, wheezing, or hemoptysis 4. Cardiovascular: No angina, claudication, lower extremity edema, pnd or orthopnea 5. Gastrointestinal: Positive for nausea or vomiting, and diarrhea as mentioned in HPI.  6. Hematology: No easy bruisability or frequent infections 7. Urology: No dysuria, hematuria or increased urinary frequency 8. Dermatology: No rashes. 9. Neurology: No seizures or paresthesias 10. Musculoskeletal: No joint pain or deformities  Past Medical History:  Diagnosis Date   Abnormal CT scan    a. CT angio incidentally noted nodular opacity in upper abdomen felt lymph node vs part of pancreas, felt to be benign lesion with stable appearance.   Chest  pain    a. Negative nuc 2010 (abnormal baseline EKG). b. Adm with pleuritic CP 02/2012: negative cardiac enzymes, CTA negative for PE  but did show bronchiectasis/scarring RUL.   Diabetes mellitus (HCC)    Diagnosed 02/2012   Elevated blood pressure    Noted 02/2012   Elevated LDL cholesterol level    Noted 02/2012    Past Surgical History:  Procedure Laterality Date   FRACTURE SURGERY     HAND TENDON SURGERY       reports that he has quit smoking. He has never used smokeless tobacco. He reports current alcohol use. He reports that he does not use drugs.  No Known Allergies  Family History  Problem Relation Age of Onset   Heart attack Father        MI in his 3650's     Prior to Admission medications   Medication Sig Start Date End Date Taking? Authorizing Provider  aspirin 81 MG EC tablet Take 81 mg by mouth daily.    Yes [provider]  cholecalciferol (VITAMIN D3) 25 MCG (1000 UT) tablet Take 1,000 Units by mouth daily.   Yes [provider]  fish oil-omega-3 fatty acids 1000 MG capsule Take 1 g by mouth daily.   Yes [provider]  lisinopril (PRINIVIL,ZESTRIL) 20 MG tablet Take 1 tablet (20 mg total) by mouth daily. 12/09/17  Yes Mancel BaleWentz, Elliott, MD  Multiple Vitamins-Minerals (CENTRUM SILVER ULTRA MENS PO) Take 1 tablet by mouth daily.   Yes [provider]  naproxen (NAPROSYN) 375 MG tablet Take 1 tablet (375 mg total) by mouth 2 (two) times daily. 09/17/16  Yes Raeford RazorKohut, Stephen, MD  rosuvastatin (CRESTOR) 20 MG  tablet Take 20 mg by mouth daily. 09/09/18  Yes [provider]  vitamin C (ASCORBIC ACID) 500 MG tablet Take 500 mg by mouth daily.   Yes [provider]  zinc gluconate 50 MG tablet Take 50 mg by mouth daily.   Yes [provider]  Clotrimazole 1 % OINT Apply 1 application topically 2 (two) times daily. Apply copiously twice daily. Then apply steroid ointment to the bottom of the feet only. Patient not  taking: Reported on 12/19/2018 08/01/15   Tereasa Coop, PA-C  tadalafil (CIALIS) 10 MG tablet Take 1 tablet (10 mg total) by mouth daily as needed for erectile dysfunction. Patient not taking: Reported on 12/19/2018 11/20/15   Nolon Rod, DO  traMADol (ULTRAM) 50 MG tablet Take 1 tablet (50 mg total) by mouth every 6 (six) hours as needed. Patient not taking: Reported on 12/19/2018 09/17/16   Virgel Manifold, MD    Physical Exam: Vitals:   12/19/18 1146 12/19/18 1200 12/19/18 1214 12/19/18 1230  BP:  (!) 154/80  139/82  Pulse:  96 99 92  Resp:  20 (!) 26 (!) 25  Temp: 99.8 F (37.7 C)     TempSrc: Oral     SpO2:  93% 95% 95%  Weight:      Height:        Vitals:   12/19/18 1146 12/19/18 1200 12/19/18 1214 12/19/18 1230  BP:  (!) 154/80  139/82  Pulse:  96 99 92  Resp:  20 (!) 26 (!) 25  Temp: 99.8 F (37.7 C)     TempSrc: Oral     SpO2:  93% 95% 95%  Weight:      Height:       General: deconditioned and ill looking appearing  Neurology: Awake and alert, non focal Head and Neck. Head normocephalic. Neck supple with no adenopathy or thyromegaly.   E ENT: mild pallor, no icterus, oral mucosa dry Cardiovascular: No JVD. S1-S2 present, rhythmic, no gallops, rubs, or murmurs. No lower extremity edema. Pulmonary: positive breath sounds bilaterally, adequate air movement, no wheezing, or rhonchi, positive rales at bases. Gastrointestinal. Abdomen mild distended, tender to deep palpation, with no organomegaly, no frank rebound.  Skin. No rashes Musculoskeletal: no joint deformities    Labs on Admission: I have personally reviewed following labs and imaging studies  CBC: Recent Labs  Lab 12/19/18 0936  WBC 8.4  NEUTROABS 6.6  HGB 14.1  HCT 44.3  MCV 79.5*  PLT 557   Basic Metabolic Panel: Recent Labs  Lab 12/19/18 0936  NA 132*  K 3.5  CL 98  CO2 22  GLUCOSE 132*  BUN 11  CREATININE 1.02  CALCIUM 9.0   GFR: Estimated Creatinine Clearance: 116.7 mL/min (by  C-G formula based on SCr of 1.02 mg/dL). Liver Function Tests: Recent Labs  Lab 12/19/18 0936  AST 55*  ALT 35  ALKPHOS 56  BILITOT 0.9  PROT 8.0  ALBUMIN 3.6   Recent Labs  Lab 12/19/18 0936  LIPASE 41   No results for input(s): AMMONIA in the last 168 hours. Coagulation Profile: No results for input(s): INR, PROTIME in the last 168 hours. Cardiac Enzymes: No results for input(s): CKTOTAL, CKMB, CKMBINDEX, TROPONINI in the last 168 hours. BNP (last 3 results) No results for input(s): PROBNP in the last 8760 hours. HbA1C: No results for input(s): HGBA1C in the last 72 hours. CBG: No results for input(s): GLUCAP in the last 168 hours. Lipid Profile: No results for  input(s): CHOL, HDL, LDLCALC, TRIG, CHOLHDL, LDLDIRECT in the last 72 hours. Thyroid Function Tests: No results for input(s): TSH, T4TOTAL, FREET4, T3FREE, THYROIDAB in the last 72 hours. Anemia Panel: No results for input(s): VITAMINB12, FOLATE, FERRITIN, TIBC, IRON, RETICCTPCT in the last 72 hours. Urine analysis:    Component Value Date/Time   COLORURINE AMBER (A) 02/18/2012 1102   APPEARANCEUR CLOUDY (A) 02/18/2012 1102   LABSPEC 1.025 02/18/2012 1102   PHURINE 5.5 02/18/2012 1102   GLUCOSEU NEGATIVE 02/18/2012 1102   HGBUR NEGATIVE 02/18/2012 1102   BILIRUBINUR neg 02/24/2012 0907   KETONESUR NEGATIVE 02/18/2012 1102   PROTEINUR 30 02/24/2012 0907   PROTEINUR 100 (A) 02/18/2012 1102   UROBILINOGEN 0.2 02/24/2012 0907   UROBILINOGEN 0.2 02/18/2012 1102   NITRITE neg 02/24/2012 0907   NITRITE NEGATIVE 02/18/2012 1102   LEUKOCYTESUR Negative 02/24/2012 0907    Radiological Exams on Admission: Ct Abdomen Pelvis W Contrast  Result Date: 12/19/2018 CLINICAL DATA:  Shortness of breath, abdominal cramping and fever. Coronavirus positive test today. EXAM: CT ABDOMEN AND PELVIS WITH CONTRAST TECHNIQUE: Multidetector CT imaging of the abdomen and pelvis was performed using the standard protocol following  bolus administration of intravenous contrast. CONTRAST:  100mL OMNIPAQUE IOHEXOL 300 MG/ML  SOLN COMPARISON:  None. FINDINGS: Lower chest: Extensive peripheral patchy ground-glass airspace opacities are seen in both lungs, consistent with multifocal atypical pneumonia. Hepatobiliary: Hepatic steatosis. Normal appearance of the gallbladder. Pancreas: Unremarkable. No pancreatic ductal dilatation or surrounding inflammatory changes. Spleen: Normal in size without focal abnormality. Adrenals/Urinary Tract: Adrenal glands are unremarkable. Kidneys are normal, without renal calculi, focal lesion, or hydronephrosis. Bladder is unremarkable. Stomach/Bowel: Normal appearance of the stomach and small bowel. The appendix is thickened inflamed with marked periappendiceal inflammatory stranding. Small appendicoliths seen within the tip of the appendix. No evidence of rupture or abscess formation. The colon is normal. Vascular/Lymphatic: Aortic atherosclerosis. No enlarged abdominal or pelvic lymph nodes. Reproductive: Prostate is unremarkable. Other: No abdominal wall hernia or abnormality. No abdominopelvic ascites. Musculoskeletal: No acute or significant osseous findings. IMPRESSION: 1. Acute appendicitis without evidence of rupture or abscess formation. 2. Bilateral multifocal peripheral ground-glass airspace disease. There are a spectrum of findings in the lungs which can be seen with acute atypical infection (as well as other non-infectious etiologies). In particular, viral pneumonia (including COVID-19) should be considered in the appropriate clinical setting. 3. Hepatic steatosis. These results were called by telephone at the time of interpretation on 12/19/2018 at 2:17 pm to Dr. Raeford RazorSTEPHEN KOHUT , who verbally acknowledged these results. Electronically Signed   By: Ted Mcalpineobrinka  Dimitrova M.D.   On: 12/19/2018 14:19   Dg Chest Portable 1 View  Result Date: 12/19/2018 CLINICAL DATA:  Fever and shortness of breath EXAM:  PORTABLE CHEST 1 VIEW COMPARISON:  December 09, 2017 FINDINGS: There is no edema or consolidation. There is scarring in the right upper lobe, stable. Heart size and pulmonary vascularity are normal. No adenopathy. There is degenerative change in the thoracic spine. IMPRESSION: No edema or consolidation. Stable scarring right upper lobe. Stable cardiac silhouette. Electronically Signed   By: Bretta BangWilliam  Woodruff III M.D.   On: 12/19/2018 09:56    EKG: Independently reviewed.  EKG 100 bpm, normal axis, normal intervals, sinus rhythm, J-point elevation V1 V2, no significant ER mellitus.  Assessment/Plan Principal Problem:   Pneumonia due to COVID-19 virus Active Problems:   Essential hypertension   Appendicitis   Type 2 diabetes mellitus with hyperlipidemia (HCC)   Sepsis (HCC)  53 year old male  who presents with 10-day course of gastrointestinal symptoms including nausea, vomiting, diarrhea, poor oral intake, and now for the last about 16 hours with severe lower abdominal pain.  On his physical examination he is ill looking appearing, his temperature is 99.8, blood pressure 154/80, heart rate 96-100, respiratory rate 26, oxygen saturation 93 and 95% on 2 LPM per [place].  He has dry mucous membranes, his lungs positive rales at bases on auscultation, heart S1-S2 present and rhythmic, abdomen is mildly distended, tender to deep palpation, no guarding, no lower extremity edema.  Sodium 132, potassium 3.5, chloride 98, bicarb 22, glucose 132, BUN 11, creatinine 1.0, white count 8.4, hemoglobin 14.1, hematocrit 44.3, platelets 182.  SARS COVID-19 is positive.  CT of the abdomen and pelvis with acute appendicitis without evidence of rupture or abscess.  Bilateral multifocal peripheral groundglass airspace disease in the lungs.  His a chest x-ray had interstitial infiltrate of the left lower lobe.  Patient will be admitted to the hospital with a working diagnosis of sepsis due to acute appendicitis, in the setting of  early viral pneumonia due to COVID-19.  1.  Sepsis due to acute appendicitis/ present on admission.  Patient will be admitted to the medical ward, continue IV fluids with balanced electrolyte solutions at 75 mL/h, as needed analgesics with morphine and as needed antiemetics with Zofran.  Antibiotic therapy with IV Zosyn.  Will keep patient nothing by mouth for now.  Await for formal surgical consultation, preliminary recommendations are to treat patient medically.  2.  Early bilateral viral pneumonia due to COVID-19 with acute hypoxic respiratory failure.  Patient is requiring supplemental oxygen, 2 L per nasal cannula, he does have bilateral groundglass opacities on his chest by CT scan and his chest radiograph has an infiltrate in the left lower lobe.  Will start patient on methylprednisolone and remdesivir.  Will check inflammatory markers per protocol.  Currently [facility], [facility] until bed available.  3.  Type II diabetes mellitus.  Patient has been diet controlled, admission glucose 132, patient will be placed on systemic steroids, will add insulin sliding scale for glucose coverage and monitoring.  4.  Hypertension.  Due to risk of hypotension, will hold antihypertensive agents, patient at home on lisinopril.    5.  Dyslipidemia.  Continue rosuvastatin.    DVT prophylaxis: enoxaparin  Code Status: full  Family Communication: no family at the beside   Disposition Plan: med surg   Consults called: surgery   Admission status: inpatient.     Sadik Piascik Annett Gulaaniel Saylah Ketner MD [facility]   12/19/2018, 2:31 PM

## 2018-12-20 ENCOUNTER — Encounter (HOSPITAL_COMMUNITY): Payer: Self-pay

## 2018-12-20 LAB — BASIC METABOLIC PANEL
Anion gap: 13 (ref 5–15)
BUN: 11 mg/dL (ref 6–20)
CO2: 23 mmol/L (ref 22–32)
Calcium: 8.8 mg/dL — ABNORMAL LOW (ref 8.9–10.3)
Chloride: 99 mmol/L (ref 98–111)
Creatinine, Ser: 0.9 mg/dL (ref 0.61–1.24)
GFR calc Af Amer: >60 mL/min (ref >60)
GFR calc non Af Amer: >60 mL/min (ref >60)
Glucose, Bld: 156 mg/dL — ABNORMAL HIGH (ref 70–99)
Potassium: 4.1 mmol/L (ref 3.5–5.1)
Sodium: 135 mmol/L (ref 135–145)

## 2018-12-20 LAB — GLUCOSE, CAPILLARY
Glucose-Capillary: 157 mg/dL — ABNORMAL HIGH (ref 70–99)
Glucose-Capillary: 184 mg/dL — ABNORMAL HIGH (ref 70–99)
Glucose-Capillary: 146 mg/dL — ABNORMAL HIGH (ref 70–99)
Glucose-Capillary: 204 mg/dL — ABNORMAL HIGH (ref 70–99)

## 2018-12-20 LAB — CBC
HCT: 42.1 % (ref 39.0–52.0)
Hemoglobin: 13.5 g/dL (ref 13.0–17.0)
MCH: 25.4 pg — ABNORMAL LOW (ref 26.0–34.0)
MCHC: 32.1 g/dL (ref 30.0–36.0)
MCV: 79.1 fL — ABNORMAL LOW (ref 80.0–100.0)
Platelets: 207 10*3/uL (ref 150–400)
RBC: 5.32 MIL/uL (ref 4.22–5.81)
RDW: 14 % (ref 11.5–15.5)
WBC: 10.5 10*3/uL (ref 4.0–10.5)
nRBC: 0 % (ref 0.0–0.2)

## 2018-12-20 LAB — D-DIMER, QUANTITATIVE: D-Dimer, Quant: 0.65 ug/mL-FEU — ABNORMAL HIGH (ref 0.00–0.50)

## 2018-12-20 LAB — CK: Total CK: 2430 U/L — ABNORMAL HIGH (ref 49–397)

## 2018-12-20 LAB — HIV ANTIBODY (ROUTINE TESTING W REFLEX): HIV Screen 4th Generation wRfx: NONREACTIVE

## 2018-12-20 LAB — C-REACTIVE PROTEIN: CRP: 16.4 mg/dL — ABNORMAL HIGH (ref <1.0)

## 2018-12-20 LAB — FERRITIN: Ferritin: 624 ng/mL — ABNORMAL HIGH (ref 24–336)

## 2018-12-20 MED ORDER — BOOST / RESOURCE BREEZE PO LIQD CUSTOM
1.0000 | Freq: Three times a day (TID) | ORAL | Status: DC
  Administered 2018-12-20 – 2018-12-21 (×4): 1 via ORAL
  Filled 2018-12-20 (×8): qty 1

## 2018-12-20 MED ORDER — GUAIFENESIN-DM 100-10 MG/5ML PO SYRP
5.0000 mL | ORAL_SOLUTION | ORAL | Status: DC | PRN
  Administered 2018-12-20 – 2018-12-21 (×4): 5 mL via ORAL
  Filled 2018-12-20: qty 10
  Filled 2018-12-20 (×3): qty 5
  Filled 2018-12-20: qty 10

## 2018-12-20 NOTE — Progress Notes (Signed)
Pt transported via EMS to [facility]. Remdesivir sent with EMS since no pump available during transport. All belongings sent with patient.

## 2018-12-20 NOTE — Progress Notes (Signed)
Initial Nutrition Assessment  DOCUMENTATION CODES:   Obesity unspecified  INTERVENTION:   Provide Boost Breeze po TID, each supplement provides 250 kcal and 9 grams of protein  NUTRITION DIAGNOSIS:   Inadequate oral intake related to acute illness, nausea, vomiting, diarrhea as evidenced by per patient/family report.  GOAL:   Patient will meet greater than or equal to 90% of their needs  MONITOR:   PO intake, Supplement acceptance, Labs, Weight trends, I & O's  REASON FOR ASSESSMENT:   Malnutrition Screening Tool    ASSESSMENT:   53 y.o. male with medical history significant of diet-controlled type 2 diabetes mellitus, hypertension and dyslipidemia who presents with abdominal pain. Patient is COVID-19 positive.  **RD working remotely**  Pt reports N/V/diarrhea that began ~10 days PTA. Pt has not ate well during this time given these symptoms. Per surgery note, pt with acute appendicitis and recommends non-operative treatment. Pt currently on clear liquid diet. Will order Boost Breeze supplements until diet can be advanced. Pt is awaiting transfer to [facility], pt will likely receive additional supplements once admitted there.   Per weight history, pt has not had any weight loss over the past year.  Medications: Vitamin D tablet daily, Omega-3 capsule daily Labs reviewed: CBGs: 157-169  NUTRITION - FOCUSED PHYSICAL EXAM:  Unable to perform -working remotely.  Diet Order:   Diet Order            Diet clear liquid Room service appropriate? Yes; Fluid consistency: Thin  Diet effective now              EDUCATION NEEDS:   No education needs have been identified at this time  Skin:  Skin Assessment: Reviewed RN Assessment  Last BM:  PTA  Height:   Ht Readings from Last 1 Encounters:  12/19/18 6\' 4"  (1.93 m)    Weight:   Wt Readings from Last 1 Encounters:  12/19/18 118.4 kg    Ideal Body Weight:  91.8 kg  BMI:  Body mass index is 31.77  kg/m.  Estimated Nutritional Needs:   Kcal:  2355-7322  Protein:  110-120g  Fluid:  2L/day  Clayton Bibles, MS, RD, LDN [facility] Pager: [phone] After Hours Pager: [phone]

## 2018-12-20 NOTE — Progress Notes (Signed)
PROGRESS NOTE  Raymond Harrell JYN:[id number] DOB: Jul 01, 1965   PCP: Patient, No Pcp Per  Patient is from: Home  DOA: 12/19/2018 LOS: 1  Brief Narrative / Interim history: 53 year old male with history of diet-controlled DM-2, HTN and HLD presenting with nausea/vomiting/diarrheal/generalized malaise/poor p.o. intake and intermittent fever for 10 days and acute severe lower abdominal pain for 1 day and found to have COVID-19 infection and acute uncomplicated appendicitis noted on CT abdomen and pelvis.   CXR with no edema or consolidation but stable scarring of the RUL and stable cardiac silhouette.  CT abdomen and pelvis with acute uncomplicated appendicitis and bilateral multifocal peripheral groundglass airspace disease and hepatic steatosis.  Briefly put on 2 L by Brooten and wean to room air.  He did not desaturate below 94%.  Started on IV Zosyn for appendicitis, and Solu-Medrol and Remdesivir for COVID-19 pneumonia and admitted to ICU appears to be a DVT.  Evaluated by general surgery who recommended medical therapy with IV antibiotic, IV Zosyn.    Subjective: Patient remained stable on room air saturating above 94% except for one documented desaturation to 89% about 3:30 PM yesterday.  Respiratory rate has been in mid 20s to the low 30s but recently down to 18.  Spiked fever to 101.2 early last night.  GI symptoms improved significantly.  Objective: Vitals:   12/19/18 1930 12/19/18 2014 12/19/18 2138 12/20/18 0555  BP: (!) 146/85  136/73 137/89  Pulse: 92     Resp: (!) 32  (!) 22 20  Temp:  100.3 F (37.9 C) 99.3 F (37.4 C) 98.4 F (36.9 C)  TempSrc:  Oral Oral Oral  SpO2: 94%  97% 96%  Weight:   118.4 kg   Height:   6\' 4"  (1.93 m)     Intake/Output Summary (Last 24 hours) at 12/20/2018 0824 Last data filed at 12/20/2018 0558 Gross per 24 hour  Intake 1350 ml  Output 400 ml  Net 950 ml   Filed Weights   12/19/18 0907 12/19/18 2138  Weight: 113.4 kg 118.4 kg     Examination:  GENERAL: No acute distress.  Appears well.  HEENT: MMM.  Vision and hearing grossly intact.  NECK: Supple.  No apparent JVD.  RESP:  No IWOB. Good air movement bilaterally. CVS:  RRR. Heart sounds normal.  ABD/GI/GU: Bowel sounds present. Soft.  Mild tenderness with deep palpation.  No rebound or guarding. MSK/EXT:  Moves extremities. No apparent deformity or edema.  SKIN: no apparent skin lesion or wound NEURO: Awake, alert and oriented appropriately.  No gross deficit.  PSYCH: Calm. Normal affect.    I have personally reviewed the following labs and images:  Radiology Studies: Ct Abdomen Pelvis W Contrast  Result Date: 12/19/2018 CLINICAL DATA:  Shortness of breath, abdominal cramping and fever. Coronavirus positive test today. EXAM: CT ABDOMEN AND PELVIS WITH CONTRAST TECHNIQUE: Multidetector CT imaging of the abdomen and pelvis was performed using the standard protocol following bolus administration of intravenous contrast. CONTRAST:  100mL OMNIPAQUE IOHEXOL 300 MG/ML  SOLN COMPARISON:  None. FINDINGS: Lower chest: Extensive peripheral patchy ground-glass airspace opacities are seen in both lungs, consistent with multifocal atypical pneumonia. Hepatobiliary: Hepatic steatosis. Normal appearance of the gallbladder. Pancreas: Unremarkable. No pancreatic ductal dilatation or surrounding inflammatory changes. Spleen: Normal in size without focal abnormality. Adrenals/Urinary Tract: Adrenal glands are unremarkable. Kidneys are normal, without renal calculi, focal lesion, or hydronephrosis. Bladder is unremarkable. Stomach/Bowel: Normal appearance of the stomach and small bowel. The appendix is thickened  inflamed with marked periappendiceal inflammatory stranding. Small appendicoliths seen within the tip of the appendix. No evidence of rupture or abscess formation. The colon is normal. Vascular/Lymphatic: Aortic atherosclerosis. No enlarged abdominal or pelvic lymph nodes.  Reproductive: Prostate is unremarkable. Other: No abdominal wall hernia or abnormality. No abdominopelvic ascites. Musculoskeletal: No acute or significant osseous findings. IMPRESSION: 1. Acute appendicitis without evidence of rupture or abscess formation. 2. Bilateral multifocal peripheral ground-glass airspace disease. There are a spectrum of findings in the lungs which can be seen with acute atypical infection (as well as other non-infectious etiologies). In particular, viral pneumonia (including COVID-19) should be considered in the appropriate clinical setting. 3. Hepatic steatosis. These results were called by telephone at the time of interpretation on 12/19/2018 at 2:17 pm to Dr. Raeford RazorSTEPHEN KOHUT , who verbally acknowledged these results. Electronically Signed   By: Ted Mcalpineobrinka  Dimitrova M.D.   On: 12/19/2018 14:19   Dg Chest Portable 1 View  Result Date: 12/19/2018 CLINICAL DATA:  Fever and shortness of breath EXAM: PORTABLE CHEST 1 VIEW COMPARISON:  December 09, 2017 FINDINGS: There is no edema or consolidation. There is scarring in the right upper lobe, stable. Heart size and pulmonary vascularity are normal. No adenopathy. There is degenerative change in the thoracic spine. IMPRESSION: No edema or consolidation. Stable scarring right upper lobe. Stable cardiac silhouette. Electronically Signed   By: Bretta BangWilliam  Woodruff III M.D.   On: 12/19/2018 09:56    Microbiology: Recent Results (from the past 240 hour(s))  SARS Coronavirus 2 (CEPHEID- Performed in [facility] lab), Hosp Order     Status: Abnormal   Collection Time: 12/19/18 10:01 AM   Specimen: Nasopharyngeal Swab  Result Value Ref Range Status   SARS Coronavirus 2 POSITIVE (A) NEGATIVE Final    Comment: CRITICAL RESULT CALLED TO, READ BACK BY AND VERIFIED WITH: RN AUDREY M. [id number] FCP (NOTE) If result is NEGATIVE SARS-CoV-2 target nucleic acids are NOT DETECTED. The SARS-CoV-2 RNA is generally detectable in upper and lower   respiratory specimens during the acute phase of infection. The lowest  concentration of SARS-CoV-2 viral copies this assay can detect is 250  copies / mL. A negative result does not preclude SARS-CoV-2 infection  and should not be used as the sole basis for treatment or other  patient management decisions.  A negative result may occur with  improper specimen collection / handling, submission of specimen other  than nasopharyngeal swab, presence of viral mutation(s) within the  areas targeted by this assay, and inadequate number of viral copies  (<250 copies / mL). A negative result must be combined with clinical  observations, patient history, and epidemiological information. If result is POSITIVE SARS-CoV-2 target nucleic acids are DETECTED.  The SARS-CoV-2 RNA is generally detectable in upper and lower  respiratory specimens during the acute phase of infection.  Positive  results are indicative of active infection with SARS-CoV-2.  Clinical  correlation with patient history and other diagnostic information is  necessary to determine patient infection status.  Positive results do  not rule out bacterial infection or co-infection with other viruses. If result is PRESUMPTIVE POSTIVE SARS-CoV-2 nucleic acids MAY BE PRESENT.   A presumptive positive result was obtained on the submitted specimen  and confirmed on repeat testing.  While 2019 novel coronavirus  (SARS-CoV-2) nucleic acids may be present in the submitted sample  additional confirmatory testing may be necessary for epidemiological  and / or clinical management purposes  to differentiate between  SARS-CoV-2  and other Sarbecovirus currently known to infect humans.  If clinically indicated additional testing with an alternate test  methodology [phone]([id number])  is advised. The SARS-CoV-2 RNA is generally  detectable in upper and lower respiratory specimens during the acute  phase of infection. The expected result is Negative. Fact  Sheet for Patients:  BoilerBrush.com.cyhttps://www.fda.gov/media/136312/download Fact Sheet for Healthcare Providers: https://pope.com/https://www.fda.gov/media/136313/download This test is not yet approved or cleared by the [place] FDA and has been authorized for detection and/or diagnosis of SARS-CoV-2 by FDA under an Emergency Use Authorization (EUA).  This EUA will remain in effect (meaning this test can be used) for the duration of the COVID-19 declaration under Section 564(b)(1) of the Act, 21 U.S.C. section 360bbb-3(b)(1), unless the authorization is terminated or revoked sooner. Performed at [facility], [address] [place], [place] [address]     Sepsis Labs: Invalid input(s): PROCALCITONIN, LACTICIDVEN  Urine analysis:    Component Value Date/Time   COLORURINE YELLOW 12/19/2018 1826   APPEARANCEUR CLEAR 12/19/2018 1826   LABSPEC 1.035 (H) 12/19/2018 1826   PHURINE 6.0 12/19/2018 1826   GLUCOSEU NEGATIVE 12/19/2018 1826   HGBUR NEGATIVE 12/19/2018 1826   BILIRUBINUR NEGATIVE 12/19/2018 1826   BILIRUBINUR neg 02/24/2012 0907   KETONESUR NEGATIVE 12/19/2018 1826   PROTEINUR 30 (A) 12/19/2018 1826   UROBILINOGEN 0.2 02/24/2012 0907   UROBILINOGEN 0.2 02/18/2012 1102   NITRITE NEGATIVE 12/19/2018 1826   LEUKOCYTESUR NEGATIVE 12/19/2018 1826    Anemia Panel: No results for input(s): VITAMINB12, FOLATE, FERRITIN, TIBC, IRON, RETICCTPCT in the last 72 hours.  Thyroid Function Tests: No results for input(s): TSH, T4TOTAL, FREET4, T3FREE, THYROIDAB in the last 72 hours.  Lipid Profile: No results for input(s): CHOL, HDL, LDLCALC, TRIG, CHOLHDL, LDLDIRECT in the last 72 hours.  CBG: Recent Labs  Lab 12/19/18 2345  GLUCAP 169*    HbA1C: No results for input(s): HGBA1C in the last 72 hours.  BNP (last 3 results): No results for input(s): PROBNP in the last 8760 hours.  Cardiac Enzymes: No results for input(s): CKTOTAL, CKMB, CKMBINDEX, TROPONINI in the last 168 hours.   Coagulation Profile: No results for input(s): INR, PROTIME in the last 168 hours.  Liver Function Tests: Recent Labs  Lab 12/19/18 0936  AST 55*  ALT 35  ALKPHOS 56  BILITOT 0.9  PROT 8.0  ALBUMIN 3.6   Recent Labs  Lab 12/19/18 0936  LIPASE 41   No results for input(s): AMMONIA in the last 168 hours.  Basic Metabolic Panel: Recent Labs  Lab 12/19/18 0936 12/19/18 2006  NA 132*  --   K 3.5  --   CL 98  --   CO2 22  --   GLUCOSE 132*  --   BUN 11  --   CREATININE 1.02 0.92  CALCIUM 9.0  --    GFR: Estimated Creatinine Clearance: 132.1 mL/min (by C-G formula based on SCr of 0.92 mg/dL).  CBC: Recent Labs  Lab 12/19/18 0936 12/19/18 2006  WBC 8.4 9.2  NEUTROABS 6.6  --   HGB 14.1 13.3  HCT 44.3 40.6  MCV 79.5* 78.4*  PLT 182 169    Procedures:  None  Microbiology summarized: 7/12-COVID-19 positive  Assessment & Plan: Sepsis due to acute appendicitis and COVID-19: Sepsis physiology resolved. -Treat individual problems as below.  Acute uncomplicated appendicitis: Clinically improving.  Minimal tenderness on exam.  No leukocytosis.  -Continue IV Zosyn per general surgery recommendation -Start clear liquid diet and discontinue IVF  Pneumonia added  COVID-19 infection: No significant desaturation charted.  Bilateral groundglass opacity on CT abdomen.  Inflammatory markers elevated.  Febrile to 101.2 last night. -Continue Solu-Medrol and remdesivir -Follow inflammatory markers. -Discontinue IV fluid.  Diet controlled DM-2: CBG was in fair range. -CBG monitoring and sliding scale insulin  Hypertension: Normotensive -Hold home lisinopril -May start amlodipine if BP elevated.  Hyperlipidemia -Hold statin in the setting of elevated CK.  DVT prophylaxis: Subcu Lovenox Code Status: Full code Family Communication: Wife was available on face time during this encounter. Disposition Plan: Transfer to [facility] Consultants: General surgery    Antimicrobials: Anti-infectives (From admission, onward)   Start     Dose/Rate Route Frequency Ordered Stop   12/20/18 2200  remdesivir 100 mg in sodium chloride 0.9 % 250 mL IVPB     100 mg 500 mL/hr over 30 Minutes Intravenous Every 24 hours 12/19/18 1947 12/24/18 2159   12/20/18 1800  remdesivir 100 mg in sodium chloride 0.9 % 250 mL IVPB  Status:  Discontinued     100 mg 500 mL/hr over 30 Minutes Intravenous Every 24 hours 12/19/18 1653 12/19/18 1947   12/19/18 2200  remdesivir 200 mg in sodium chloride 0.9 % 250 mL IVPB     200 mg 500 mL/hr over 30 Minutes Intravenous Once 12/19/18 1947 12/20/18 0019   12/19/18 2100  piperacillin-tazobactam (ZOSYN) IVPB 3.375 g     3.375 g 12.5 mL/hr over 240 Minutes Intravenous Every 8 hours 12/19/18 1541     12/19/18 1800  remdesivir 200 mg in sodium chloride 0.9 % 250 mL IVPB  Status:  Discontinued     200 mg 500 mL/hr over 30 Minutes Intravenous Once 12/19/18 1653 12/19/18 1947   12/19/18 1430  piperacillin-tazobactam (ZOSYN) IVPB 3.375 g     3.375 g 100 mL/hr over 30 Minutes Intravenous  Once 12/19/18 1423 12/19/18 1726      Sch Meds:  Scheduled Meds: . cholecalciferol  1,000 Units Oral Daily  . enoxaparin (LOVENOX) injection  40 mg Subcutaneous Q24H  . insulin aspart  0-9 Units Subcutaneous TID WC  . methylPREDNISolone (SOLU-MEDROL) injection  60 mg Intravenous Q12H  . omega-3 acid ethyl esters  1 g Oral Daily  . pantoprazole (PROTONIX) IV  40 mg Intravenous Q24H  . pneumococcal 23 valent vaccine  0.5 mL Intramuscular Tomorrow-1000  . rosuvastatin  20 mg Oral Daily   Continuous Infusions: . lactated ringers 75 mL/hr at 12/19/18 1807  . piperacillin-tazobactam (ZOSYN)  IV 3.375 g (12/20/18 0105)  . remdesivir 100 mg in NS 250 mL     PRN Meds:.acetaminophen **OR** acetaminophen, morphine injection, ondansetron **OR** ondansetron (ZOFRAN) IV   Vanisha Whiten T. [place]  If 7PM-7AM, please contact night-coverage  [email or web address] Password [facility] 12/20/2018, 8:24 AM

## 2018-12-20 NOTE — Progress Notes (Signed)
Patient ID: Raymond Harrell, male   DOB: 06-07-1966, 53 y.o.   MRN: [id number]       Subjective: Had temp overnight of 101.2.  No abdominal pain.  SOB improved.  No nausea.  + flatus  Objective: Vital signs in last 24 hours: Temp:  [98.4 F (36.9 C)-101.2 F (38.4 C)] 98.6 F (37 C) (07/13 0932) Pulse Rate:  [76-104] 83 (07/13 0932) Resp:  [14-32] 18 (07/13 0932) BP: (123-165)/(60-100) 149/81 (07/13 0932) SpO2:  [89 %-100 %] 96 % (07/13 0932) Weight:  [253.6 kg] 118.4 kg (07/12 2138)    Intake/Output from previous day: 07/12 0701 - 07/13 0700 In: 1350 [I.V.:1000; IV Piggyback:350] Out: 400 [Urine:400] Intake/Output this shift: No intake/output data recorded.  PE: Heart: regular Lungs: CTAB, on RA Abd: soft, NT, ND, +BS  Lab Results:  Recent Labs    12/19/18 2006 12/20/18 0837  WBC 9.2 10.5  HGB 13.3 13.5  HCT 40.6 42.1  PLT 169 207   BMET Recent Labs    12/19/18 0936 12/19/18 2006  NA 132*  --   K 3.5  --   CL 98  --   CO2 22  --   GLUCOSE 132*  --   BUN 11  --   CREATININE 1.02 0.92  CALCIUM 9.0  --    PT/INR No results for input(s): LABPROT, INR in the last 72 hours. CMP     Component Value Date/Time   NA 132 (L) 12/19/2018 0936   K 3.5 12/19/2018 0936   CL 98 12/19/2018 0936   CO2 22 12/19/2018 0936   GLUCOSE 132 (H) 12/19/2018 0936   BUN 11 12/19/2018 0936   CREATININE 0.92 12/19/2018 2006   CREATININE 1.01 10/23/2015 1712   CALCIUM 9.0 12/19/2018 0936   PROT 8.0 12/19/2018 0936   ALBUMIN 3.6 12/19/2018 0936   AST 55 (H) 12/19/2018 0936   ALT 35 12/19/2018 0936   ALKPHOS 56 12/19/2018 0936   BILITOT 0.9 12/19/2018 0936   GFRNONAA >60 12/19/2018 2006   GFRNONAA >89 08/01/2015 1816   GFRAA >60 12/19/2018 2006   GFRAA >89 08/01/2015 1816   Lipase     Component Value Date/Time   LIPASE 41 12/19/2018 0936       Studies/Results: Ct Abdomen Pelvis W Contrast  Result Date: 12/19/2018 CLINICAL DATA:  Shortness of breath,  abdominal cramping and fever. Coronavirus positive test today. EXAM: CT ABDOMEN AND PELVIS WITH CONTRAST TECHNIQUE: Multidetector CT imaging of the abdomen and pelvis was performed using the standard protocol following bolus administration of intravenous contrast. CONTRAST:  19mL OMNIPAQUE IOHEXOL 300 MG/ML  SOLN COMPARISON:  None. FINDINGS: Lower chest: Extensive peripheral patchy ground-glass airspace opacities are seen in both lungs, consistent with multifocal atypical pneumonia. Hepatobiliary: Hepatic steatosis. Normal appearance of the gallbladder. Pancreas: Unremarkable. No pancreatic ductal dilatation or surrounding inflammatory changes. Spleen: Normal in size without focal abnormality. Adrenals/Urinary Tract: Adrenal glands are unremarkable. Kidneys are normal, without renal calculi, focal lesion, or hydronephrosis. Bladder is unremarkable. Stomach/Bowel: Normal appearance of the stomach and small bowel. The appendix is thickened inflamed with marked periappendiceal inflammatory stranding. Small appendicoliths seen within the tip of the appendix. No evidence of rupture or abscess formation. The colon is normal. Vascular/Lymphatic: Aortic atherosclerosis. No enlarged abdominal or pelvic lymph nodes. Reproductive: Prostate is unremarkable. Other: No abdominal wall hernia or abnormality. No abdominopelvic ascites. Musculoskeletal: No acute or significant osseous findings. IMPRESSION: 1. Acute appendicitis without evidence of rupture or abscess formation. 2. Bilateral multifocal peripheral  ground-glass airspace disease. There are a spectrum of findings in the lungs which can be seen with acute atypical infection (as well as other non-infectious etiologies). In particular, viral pneumonia (including COVID-19) should be considered in the appropriate clinical setting. 3. Hepatic steatosis. These results were called by telephone at the time of interpretation on 12/19/2018 at 2:17 pm to Dr. Raeford RazorSTEPHEN KOHUT , who  verbally acknowledged these results. Electronically Signed   By: Ted Mcalpineobrinka  Dimitrova M.D.   On: 12/19/2018 14:19   Dg Chest Portable 1 View  Result Date: 12/19/2018 CLINICAL DATA:  Fever and shortness of breath EXAM: PORTABLE CHEST 1 VIEW COMPARISON:  December 09, 2017 FINDINGS: There is no edema or consolidation. There is scarring in the right upper lobe, stable. Heart size and pulmonary vascularity are normal. No adenopathy. There is degenerative change in the thoracic spine. IMPRESSION: No edema or consolidation. Stable scarring right upper lobe. Stable cardiac silhouette. Electronically Signed   By: Bretta BangWilliam  Woodruff III M.D.   On: 12/19/2018 09:56    Anti-infectives: Anti-infectives (From admission, onward)   Start     Dose/Rate Route Frequency Ordered Stop   12/20/18 2200  remdesivir 100 mg in sodium chloride 0.9 % 250 mL IVPB     100 mg 500 mL/hr over 30 Minutes Intravenous Every 24 hours 12/19/18 1947 12/24/18 2159   12/20/18 1800  remdesivir 100 mg in sodium chloride 0.9 % 250 mL IVPB  Status:  Discontinued     100 mg 500 mL/hr over 30 Minutes Intravenous Every 24 hours 12/19/18 1653 12/19/18 1947   12/19/18 2200  remdesivir 200 mg in sodium chloride 0.9 % 250 mL IVPB     200 mg 500 mL/hr over 30 Minutes Intravenous Once 12/19/18 1947 12/20/18 0019   12/19/18 2100  piperacillin-tazobactam (ZOSYN) IVPB 3.375 g     3.375 g 12.5 mL/hr over 240 Minutes Intravenous Every 8 hours 12/19/18 1541     12/19/18 1800  remdesivir 200 mg in sodium chloride 0.9 % 250 mL IVPB  Status:  Discontinued     200 mg 500 mL/hr over 30 Minutes Intravenous Once 12/19/18 1653 12/19/18 1947   12/19/18 1430  piperacillin-tazobactam (ZOSYN) IVPB 3.375 g     3.375 g 100 mL/hr over 30 Minutes Intravenous  Once 12/19/18 1423 12/19/18 1726       Assessment/Plan  COVID 19 +  Acute appendicitis -this is uncomplicated.  Will treat nonoperatively.  This has been discussed with the patient extensively. -agree with  CLD today -cont abx therapy, WBC normal today -will follow  FEN - CLD VTE - Lovenox, SCDs ID - Zosyn   LOS: 1 day    Letha CapeKelly E Vandy Tsuchiya , [facility] 12/20/2018, 9:40 AM Pager: [phone]

## 2018-12-21 LAB — GLUCOSE, CAPILLARY
Glucose-Capillary: 151 mg/dL — ABNORMAL HIGH (ref 70–99)
Glucose-Capillary: 214 mg/dL — ABNORMAL HIGH (ref 70–99)
Glucose-Capillary: 196 mg/dL — ABNORMAL HIGH (ref 70–99)
Glucose-Capillary: 173 mg/dL — ABNORMAL HIGH (ref 70–99)

## 2018-12-21 NOTE — Progress Notes (Signed)
0010 Pt has arrived in room from [facility] via [facility].Orders noted, pt is on FaceTime with his wife Darnelle Bos) so I was able to update her on the plan of care and that a Dr / RN will call in the AM with an update.

## 2018-12-21 NOTE — Progress Notes (Signed)
Spoke with Dr. Avon Gully about this patient over at [facility].  He is doing well today with essentially no abdominal pain.  He has advanced his diet today.  He is doing well from a pulmonary standpoint and will likely discharge tomorrow pending no new issues.  We discussed transition to augmentin from zosyn to complete a 7-10 day course at discharge.  We will arrange follow up in our office in 3-4 weeks for further evaluation and management.  Henreitta Cea  12:03 PM 12/21/2018

## 2018-12-21 NOTE — Progress Notes (Signed)
PROGRESS NOTE  Raymond Harrell ZOX:[id number] DOB: [birth year]   PCP: Patient, No Pcp Per  Patient is from: Home  DOA: 12/19/2018 LOS: 2  Brief Narrative / Interim history: 53 year old male with history of diet-controlled DM-2, HTN and HLD presenting with nausea/vomiting/diarrheal/generalized malaise/poor p.o. intake and intermittent fever for 10 days and acute severe lower abdominal pain for 1 day and found to have COVID-19 infection and acute uncomplicated appendicitis noted on CT abdomen and pelvis.   CXR with no edema or consolidation but stable scarring of the RUL and stable cardiac silhouette.  CT abdomen and pelvis with acute uncomplicated appendicitis and bilateral multifocal peripheral groundglass airspace disease and hepatic steatosis.  Briefly put on 2 L by [place] and wean to room air.  He did not desaturate below 94%.  Started on IV Zosyn for appendicitis, and Solu-Medrol and Remdesivir for COVID-19 pneumonia and admitted to ICU appears to be a DVT.  Evaluated by general surgery who recommended medical therapy with IV antibiotic, IV Zosyn.   Assessment & Plan:  Principal Problem:   Pneumonia due to COVID-19 virus Active Problems:   Essential hypertension   Appendicitis   Type 2 diabetes mellitus with hyperlipidemia (HCC)   Sepsis (HCC)   Acute hypoxic respiratory failure 2/2 COVID-19 pneumonia, resolving 89% on RA reported at intake transiently Bilateral groundglass opacity on CT abdomen.  Inflammatory markers elevated.  Febrile to 101.2 last night. -Continue Solu-Medrol and stop Remdesivir - only transiently hypoxic, now on room air even with exertion Recent Labs    12/20/18 0837  DDIMER 0.65*  FERRITIN 624*  CRP 16.4*   Lab Results  Component Value Date   SARSCOV2NAA POSITIVE (A) 12/19/2018   Acute uncomplicated appendicitis: Clinically improving   Minimal tenderness on exam.   Remains without leukocytosis.  Continue Zosyn per general surgery recommendation -  likely de-escalate to augmentin at DC Continue to advance diet as tolerated  Diet controlled DM-2: controlled CBG monitoring and sliding scale insulin  Hypertension: well controlled Hold home lisinopril Consider amlodipine if BP elevated.  Hyperlipidemia Hold statin in the setting of elevated CK.  DVT prophylaxis: Subcu Lovenox Code Status: Full code Family Communication: Wife was available on face time during this encounter. [facility] Consult[facility]: General surgery  Subjective: No acute issues or events overnight, patient remains on room air, abdominal pain markedly improved, requesting advancement in diet solution of abdominal symptoms.  Without chest pain, shortness of breath, nausea, vomiting, diarrhea, constipation, headache, fevers, chills.  Objective: Vitals:   12/21/18 0021 12/21/18 0416 12/21/18 0758 12/21/18 0800  BP: (!) 146/82 (!) 151/80  127/82  Pulse:  75  66  Resp:      Temp: 98.9 F (37.2 C)  97.8 F (36.6 C)   TempSrc: Oral  Oral   SpO2:  96%  94%  Weight:      Height:        Intake/Output Summary (Last 24 hours) at 12/21/2018 1049 Last data filed at 12/21/2018 0800 Gross per 24 hour  Intake 877.32 ml  Output 1150 ml  Net -272.68 ml   Filed Weights   12/19/18 0907 12/19/18 2138  Weight: 113.4 kg 118.4 kg    Examination:  GENERAL: No acute distress.  Appears well.  HEENT: MMM.  Vision and hearing grossly intact.  NECK: Supple.  No apparent JVD.  RESP:  No IWOB. Good air movement bilaterally. CVS:  RRR. Heart sounds normal.  ABD/GI/GU: Bowel sounds present. Soft.  Minimal tenderness with deep  palpation.  No rebound or guarding. MSK/EXT:  Moves extremities. No apparent deformity or edema.  SKIN: no apparent skin lesion or wound NEURO: Awake, alert and oriented appropriately.  No gross deficit.  PSYCH: Calm. Normal affect.   I have personally reviewed the following labs and images:  Radiology Studies: No results found.   Microbiology: Recent Results (from the past 240 hour(s))  SARS Coronavirus 2 (CEPHEID- Performed [facility]), Hosp Order     Status: Abnormal   Collection Time: 12/19/18 10:01 AM   Specimen: Nasopharyngeal Swab  Result Value Ref Range Status   SARS Coronavirus 2 POSITIVE (A) NEGATIVE Final    Comment: CRITICAL RESULT CALLED TO, READ BACK BY AND VERIFIED WITH: RN AUDREY M. [id number] FCP (NOTE) If result is NEGATIVE SARS-CoV-2 target nucleic acids are NOT DETECTED. The SARS-CoV-2 RNA is generally detectable in upper and lower  respiratory specimens during the acute phase of infection. The lowest  concentration of SARS-CoV-2 viral copies this assay can detect is 250  copies / mL. A negative result does not preclude SARS-CoV-2 infection  and should not be used as the sole basis for treatment or other  patient management decisions.  A negative result may occur with  improper specimen collection / handling, submission of specimen other  than nasopharyngeal swab, presence of viral mutation(s) within the  areas targeted by this assay, and inadequate number of viral copies  (<250 copies / mL). A negative result must be combined with clinical  observations, patient history, and epidemiological information. If result is POSITIVE SARS-CoV-2 target nucleic acids are DETECTED.  The SARS-CoV-2 RNA is generally detectable in upper and lower  respiratory specimens during the acute phase of infection.  Positive  results are indicative of active infection with SARS-CoV-2.  Clinical  correlation with patient history and other diagnostic information is  necessary to determine patient infection status.  Positive results do  not rule out bacterial infection or co-infection with other viruses. If result is PRESUMPTIVE POSTIVE SARS-CoV-2 nucleic acids MAY BE PRESENT.   A presumptive positive result was obtained on the submitted specimen  and confirmed on repeat testing.  While 2019  novel coronavirus  (SARS-CoV-2) nucleic acids may be present in the submitted sample  additional confirmatory testing may be necessary for epidemiological  and / or clinical management purposes  to differentiate between  SARS-CoV-2 and other Sarbecovirus currently known to infect humans.  If clinically indicated additional testing with an alternate test  methodology [phone]([id number])  is advised. The SARS-CoV-2 RNA is generally  detectable in upper and lower respiratory specimens during the acute  phase of infection. The expected result is Negative. Fact Sheet for Patients:  BoilerBrush.com.cyhttps://www.fda.gov/media/136312/download Fact Sheet for Healthcare Providers: https://pope.com/https://www.fda.gov/media/136313/download This test is not yet approved or cleared by the [place] FDA and has been authorized for detection and/or diagnosis of SARS-CoV-2 by FDA under an Emergency Use Authorization (EUA).  This EUA will remain in effect (meaning this test can be used) for the duration of the COVID-19 declaration under Section 564(b)(1) of the Act, 21 U.S.C. section 360bbb-3(b)(1), unless the authorization is terminated or revoked sooner. Performed at [facility], [address] [place], [place] [address]     Sepsis Labs: Invalid input(s): PROCALCITONIN, LACTICIDVEN  Urine analysis:    Component Value Date/Time   COLORURINE YELLOW 12/19/2018 1826   APPEARANCEUR CLEAR 12/19/2018 1826   LABSPEC 1.035 (H) 12/19/2018 1826   PHURINE 6.0 12/19/2018 1826   GLUCOSEU NEGATIVE 12/19/2018  1826   HGBUR NEGATIVE 12/19/2018 1826   BILIRUBINUR NEGATIVE 12/19/2018 1826   BILIRUBINUR neg 02/24/2012 0907   KETONESUR NEGATIVE 12/19/2018 1826   PROTEINUR 30 (A) 12/19/2018 1826   UROBILINOGEN 0.2 02/24/2012 0907   UROBILINOGEN 0.2 02/18/2012 1102   NITRITE NEGATIVE 12/19/2018 1826   LEUKOCYTESUR NEGATIVE 12/19/2018 1826   CBG: Recent Labs  Lab 12/20/18 0928 12/20/18 1212 12/20/18 1638 12/20/18 2104 12/21/18 0757   GLUCAP 157* 184* 146* 204* 151*   Cardiac Enzymes: Recent Labs  Lab 12/20/18 0837  CKTOTAL 2,430*   Coagulation Profile: No results for input(s): INR, PROTIME in the last 168 hours.  Liver Function Tests: Recent Labs  Lab 12/19/18 0936  AST 55*  ALT 35  ALKPHOS 56  BILITOT 0.9  PROT 8.0  ALBUMIN 3.6   Recent Labs  Lab 12/19/18 0936  LIPASE 41   No results for input(s): AMMONIA in the last 168 hours.  Basic Metabolic Panel: Recent Labs  Lab 12/19/18 0936 12/19/18 2006 12/20/18 0837  NA 132*  --  135  K 3.5  --  4.1  CL 98  --  99  CO2 22  --  23  GLUCOSE 132*  --  156*  BUN 11  --  11  CREATININE 1.02 0.92 0.90  CALCIUM 9.0  --  8.8*   GFR: Estimated Creatinine Clearance: 135 mL/min (by C-G formula based on SCr of 0.9 mg/dL).  CBC: Recent Labs  Lab 12/19/18 0936 12/19/18 2006 12/20/18 0837  WBC 8.4 9.2 10.5  NEUTROABS 6.6  --   --   HGB 14.1 13.3 13.5  HCT 44.3 40.6 42.1  MCV 79.5* 78.4* 79.1*  PLT 182 169 207    Procedures:  None  Microbiology summarized: 7/12-COVID-19 positive   Antimicrobials: Anti-infectives (From admission, onward)   Start     Dose/Rate Route Frequency Ordered Stop   12/20/18 2200  remdesivir 100 mg in sodium chloride 0.9 % 250 mL IVPB  Status:  Discontinued     100 mg 500 mL/hr over 30 Minutes Intravenous Every 24 hours 12/19/18 1947 12/21/18 0755   12/20/18 1800  remdesivir 100 mg in sodium chloride 0.9 % 250 mL IVPB  Status:  Discontinued     100 mg 500 mL/hr over 30 Minutes Intravenous Every 24 hours 12/19/18 1653 12/19/18 1947   12/19/18 2200  remdesivir 200 mg in sodium chloride 0.9 % 250 mL IVPB     200 mg 500 mL/hr over 30 Minutes Intravenous Once 12/19/18 1947 12/20/18 0950   12/19/18 2100  piperacillin-tazobactam (ZOSYN) IVPB 3.375 g     3.375 g 12.5 mL/hr over 240 Minutes Intravenous Every 8 hours 12/19/18 1541     12/19/18 1800  remdesivir 200 mg in sodium chloride 0.9 % 250 mL IVPB  Status:   Discontinued     200 mg 500 mL/hr over 30 Minutes Intravenous Once 12/19/18 1653 12/19/18 1947   12/19/18 1430  piperacillin-tazobactam (ZOSYN) IVPB 3.375 g     3.375 g 100 mL/hr over 30 Minutes Intravenous  Once 12/19/18 1423 12/19/18 1726      Sch Meds:  Scheduled Meds: . cholecalciferol  1,000 Units Oral Daily  . enoxaparin (LOVENOX) injection  40 mg Subcutaneous Q24H  . feeding supplement  1 Container Oral TID BM  . insulin aspart  0-9 Units Subcutaneous TID WC  . methylPREDNISolone (SOLU-MEDROL) injection  60 mg Intravenous Q12H  . omega-3 acid ethyl esters  1 g Oral Daily  . pantoprazole (PROTONIX) IV  40 mg Intravenous Q24H   Continuous Infusions: . piperacillin-tazobactam (ZOSYN)  IV 3.375 g (12/21/18 0642)   PRN Meds:.acetaminophen **OR** acetaminophen, guaiFENesin-dextromethorphan, morphine injection, ondansetron **OR** ondansetron (ZOFRAN) IV  Holli Humbles DO  [facility] If 7PM-7AM, please contact night-coverage [email or web address] Password [facility] 12/21/2018, 10:49 AM

## 2018-12-22 LAB — D-DIMER, QUANTITATIVE: D-Dimer, Quant: 0.31 ug/mL-FEU (ref 0.00–0.50)

## 2018-12-22 LAB — GLUCOSE, CAPILLARY
Glucose-Capillary: 149 mg/dL — ABNORMAL HIGH (ref 70–99)
Glucose-Capillary: 247 mg/dL — ABNORMAL HIGH (ref 70–99)

## 2018-12-22 LAB — C-REACTIVE PROTEIN: CRP: 4.4 mg/dL — ABNORMAL HIGH (ref <1.0)

## 2018-12-22 LAB — CK: Total CK: 1208 U/L — ABNORMAL HIGH (ref 49–397)

## 2018-12-22 LAB — FERRITIN: Ferritin: 588 ng/mL — ABNORMAL HIGH (ref 24–336)

## 2018-12-22 MED ORDER — PANTOPRAZOLE SODIUM 40 MG PO TBEC: 40.0000 mg | DELAYED_RELEASE_TABLET | Freq: Every day | ORAL | Status: DC

## 2018-12-22 MED ORDER — AMOXICILLIN-POT CLAVULANATE 875-125 MG PO TABS: 1.0000 | ORAL_TABLET | Freq: Two times a day (BID) | ORAL | 0 refills | Status: AC

## 2018-12-22 MED ORDER — PREDNISONE 10 MG PO TABS: ORAL_TABLET | ORAL | 0 refills | Status: AC

## 2018-12-22 NOTE — Discharge Instructions (Signed)
Appendicitis, Adult  The appendix is a tube in the body that is shaped like a finger. It is attached to the large intestine. Appendicitis means that this tube is swollen (inflamed). If this is not treated, the tube can tear (rupture). This can lead to a life-threatening infection. This condition can also cause pus to build up in the appendix (abscess). What are the causes? This condition may be caused by something that blocks the appendix. These include:  A ball of poop (stool).  Lymph glands that are bigger than normal. Sometimes the cause is not known. What increases the risk? You are more likely to develop this condition if you are between 10 and 30 years of age. What are the signs or symptoms? Symptoms of this condition include:  Pain around the belly button (navel). ? The pain moves toward the lower right belly (abdomen). ? The pain can get worse with time. ? The pain can get worse if you cough. ? The pain can get worse if you move suddenly.  Tenderness in the lower right belly.  Feeling sick to your stomach (nauseous).  Throwing up (vomiting).  Not feeling hungry (loss of appetite).  A fever.  Having trouble pooping (constipation).  Watery poop (diarrhea).  Not feeling well. How is this treated? Most often, this condition is treated by taking out the appendix (appendectomy). There are two ways to do this:  Open surgery. For this method, the appendix is taken out through a large cut (incision). The cut is made in the lower right belly. This surgery may be used if: ? You have scars from another surgery. ? You have a bleeding condition. ? You are pregnant and will be having your baby soon. ? You have a condition that makes it hard to do the other type of surgery.  Laparoscopic surgery. For this method, the appendix is taken out through small cuts. Often, this surgery: ? Causes less pain. ? Causes fewer problems. ? Is easier to heal from. If your appendix tears and  pus forms:  A drain may be put into the sore. The drain will be used to get rid of the pus.  You may get an antibiotic medicine through an IV line.  Your appendix may or may not need to be taken out. Follow these instructions at home: If you had surgery, follow instructions from your doctor on how to care for yourself at home and how to take care of your cut from surgery. Medicines  Take over-the-counter and prescription medicines only as told by your doctor.  If you were prescribed an antibiotic medicine, take it as told by your doctor. Do not stop taking the antibiotic even if you start to feel better. Eating and drinking Follow instructions from your doctor about what you cannot eat or drink. You may go back to your diet slowly if:  You no longer feel sick to your stomach.  You have stopped throwing up. General instructions  Do not use any products that contain nicotine or tobacco, such as cigarettes, e-cigarettes, and chewing tobacco. If you need help quitting, ask your doctor.  Do not drive or use heavy machinery while taking prescription pain medicine.  Ask your doctor if the medicine you are taking can cause trouble pooping. You may need to take steps to prevent or treat trouble pooping: ? Drink enough fluid to keep your pee (urine) pale yellow. ? Take over-the-counter or prescription medicines. ? Eat foods that are high in fiber. These include beans,   whole grains, and fresh fruits and vegetables. ? Limit foods that are high in fat and sugar. These include fried or sweet foods.  Keep all follow-up visits as told by your doctor. This is important. Contact a doctor if:  There is pus, blood, or a lot of fluid coming from your cut or cuts from surgery.  You are sick to your stomach or you throw up. Get help right away if:  You have pain in your belly, and the pain is getting worse.  You have a fever.  You have chills.  You are very tired.  You have muscle  pain.  You are short of breath. Summary  Appendicitis is swelling of the appendix. The appendix is a tube that is shaped like a finger. It is joined to the large intestine.  This condition may be caused by something that blocks the appendix. This can lead to an infection.  This condition is usually treated by taking out the appendix. This information is not intended to replace advice given to you by your health care provider. Make sure you discuss any questions you have with your health care provider. Document Released: 08/18/2011 Document Revised: 11/11/2017 Document Reviewed: 11/11/2017 Elsevier Patient Education  2020 [employer]    COVID-19 COVID-19 is a respiratory infection that is caused by a virus called severe acute respiratory syndrome coronavirus 2 (SARS-CoV-2). The disease is also known as coronavirus disease or novel coronavirus. In some people, the virus may not cause any symptoms. In others, it may cause a serious infection. The infection can get worse quickly and can lead to complications, such as:  Pneumonia, or infection of the lungs.  Acute respiratory distress syndrome or ARDS. This is fluid build-up in the lungs.  Acute respiratory failure. This is a condition in which there is not enough oxygen passing from the lungs to the body.  Sepsis or septic shock. This is a serious bodily reaction to an infection.  Blood clotting problems.  Secondary infections due to bacteria or fungus. The virus that causes COVID-19 is contagious. This means that it can spread from person to person through droplets from coughs and sneezes (respiratory secretions). What are the causes? This illness is caused by a virus. You may catch the virus by:  Breathing in droplets from an infected person's cough or sneeze.  Touching something, like a table or a doorknob, that was exposed to the virus (contaminated) and then touching your mouth, nose, or eyes. What increases the risk? Risk  for infection You are more likely to be infected with this virus if you:  Live in or travel to an area with a COVID-19 outbreak.  Come in contact with a sick person who recently traveled to an area with a COVID-19 outbreak.  Provide care for or live with a person who is infected with COVID-19. Risk for serious illness You are more likely to become seriously ill from the virus if you:  Are 53 years of age or older.  Have a long-term disease that lowers your body's ability to fight infection (immunocompromised).  Live in a nursing home or long-term care facility.  Have a long-term (chronic) disease such as: ? Chronic lung disease, including chronic obstructive pulmonary disease or asthma ? Heart disease. ? Diabetes. ? Chronic kidney disease. ? Liver disease.  Are obese. What are the signs or symptoms? Symptoms of this condition can range from mild to severe. Symptoms may appear any time from 2 to 14 days after being exposed  to the virus. They include:  A fever.  A cough.  Difficulty breathing.  Chills.  Muscle pains.  A sore throat.  Loss of taste or smell. Some people may also have stomach problems, such as nausea, vomiting, or diarrhea. Other people may not have any symptoms of COVID-19. How is this diagnosed? This condition may be diagnosed based on:  Your signs and symptoms, especially if: ? You live in an area with a COVID-19 outbreak. ? You recently traveled to or from an area where the virus is common. ? You provide care for or live with a person who was diagnosed with COVID-19.  A physical exam.  Lab tests, which may include: ? A nasal swab to take a sample of fluid from your nose. ? A throat swab to take a sample of fluid from your throat. ? A sample of mucus from your lungs (sputum). ? Blood tests.  Imaging tests, which may include, X-rays, CT scan, or ultrasound. How is this treated? At present, there is no medicine to treat COVID-19. Medicines  that treat other diseases are being used on a trial basis to see if they are effective against COVID-19. Your health care provider will talk with you about ways to treat your symptoms. For most people, the infection is mild and can be managed at home with rest, fluids, and over-the-counter medicines. Treatment for a serious infection usually takes places in a hospital intensive care unit (ICU). It may include one or more of the following treatments. These treatments are given until your symptoms improve.  Receiving fluids and medicines through an IV.  Supplemental oxygen. Extra oxygen is given through a tube in the nose, a face mask, or a hood.  Positioning you to lie on your stomach (prone position). This makes it easier for oxygen to get into the lungs.  Continuous positive airway pressure (CPAP) or bi-level positive airway pressure (BPAP) machine. This treatment uses mild air pressure to keep the airways open. A tube that is connected to a motor delivers oxygen to the body.  Ventilator. This treatment moves air into and out of the lungs by using a tube that is placed in your windpipe.  Tracheostomy. This is a procedure to create a hole in the neck so that a breathing tube can be inserted.  Extracorporeal membrane oxygenation (ECMO). This procedure gives the lungs a chance to recover by taking over the functions of the heart and lungs. It supplies oxygen to the body and removes carbon dioxide. Follow these instructions at home: Lifestyle  If you are sick, stay home except to get medical care. Your health care provider will tell you how long to stay home. Call your health care provider before you go for medical care.  Rest at home as told by your health care provider.  Do not use any products that contain nicotine or tobacco, such as cigarettes, e-cigarettes, and chewing tobacco. If you need help quitting, ask your health care provider.  Return to your normal activities as told by your  health care provider. Ask your health care provider what activities are safe for you. General instructions  Take over-the-counter and prescription medicines only as told by your health care provider.  Drink enough fluid to keep your urine pale yellow.  Keep all follow-up visits as told by your health care provider. This is important. How is this prevented?  There is no vaccine to help prevent COVID-19 infection. However, there are steps you can take to protect yourself and  others from this virus. To protect yourself:   Do not travel to areas where COVID-19 is a risk. The areas where COVID-19 is reported change often. To identify high-risk areas and travel restrictions, check the CDC travel website: FatFares.com.br  If you live in, or must travel to, an area where COVID-19 is a risk, take precautions to avoid infection. ? Stay away from people who are sick. ? Wash your hands often with soap and water for 20 seconds. If soap and water are not available, use an alcohol-based hand sanitizer. ? Avoid touching your mouth, face, eyes, or nose. ? Avoid going out in public, follow guidance from your state and local health authorities. ? If you must go out in public, wear a cloth face covering or face mask. ? Disinfect objects and surfaces that are frequently touched every day. This may include:  Counters and tables.  Doorknobs and light switches.  Sinks and faucets.  Electronics, such as phones, remote controls, keyboards, computers, and tablets. To protect others: If you have symptoms of COVID-19, take steps to prevent the virus from spreading to others.  If you think you have a COVID-19 infection, contact your health care provider right away. Tell your health care team that you think you may have a COVID-19 infection.  Stay home. Leave your house only to seek medical care. Do not use public transport.  Do not travel while you are sick.  Wash your hands often with soap and  water for 20 seconds. If soap and water are not available, use alcohol-based hand sanitizer.  Stay away from other members of your household. Let healthy household members care for children and pets, if possible. If you have to care for children or pets, wash your hands often and wear a mask. If possible, stay in your own room, separate from others. Use a different bathroom.  Make sure that all people in your household wash their hands well and often.  Cough or sneeze into a tissue or your sleeve or elbow. Do not cough or sneeze into your hand or into the air.  Wear a cloth face covering or face mask. Where to find more information  Centers for Disease Control and Prevention: PurpleGadgets.be  World Health Organization: https://www.castaneda.info/ Contact a health care provider if:  You live in or have traveled to an area where COVID-19 is a risk and you have symptoms of the infection.  You have had contact with someone who has COVID-19 and you have symptoms of the infection. Get help right away if:  You have trouble breathing.  You have pain or pressure in your chest.  You have confusion.  You have bluish lips and fingernails.  You have difficulty waking from sleep.  You have symptoms that get worse. These symptoms may represent a serious problem that is an emergency. Do not wait to see if the symptoms will go away. Get medical help right away. Call your local emergency services (911 in the [place] Do not drive yourself to the hospital. Let the emergency medical personnel know if you think you have COVID-19. Summary  COVID-19 is a respiratory infection that is caused by a virus. It is also known as coronavirus disease or novel coronavirus. It can cause serious infections, such as pneumonia, acute respiratory distress syndrome, acute respiratory failure, or sepsis.  The virus that causes COVID-19 is contagious. This means that it can spread  from person to person through droplets from coughs and sneezes.  You are more likely to  develop a serious illness if you are 69 years of age or older, have a weak immunity, live in a nursing home, or have chronic disease.  There is no medicine to treat COVID-19. Your health care provider will talk with you about ways to treat your symptoms.  Take steps to protect yourself and others from infection. Wash your hands often and disinfect objects and surfaces that are frequently touched every day. Stay away from people who are sick and wear a mask if you are sick. This information is not intended to replace advice given to you by your health care provider. Make sure you discuss any questions you have with your health care provider. Document Released: 07/01/2018 Document Revised: 10/21/2018 Document Reviewed: 07/01/2018 Elsevier Patient Education  2020 [employer] Inc.  COVID-19: How to Protect Yourself and Others Know how it spreads  There is currently no vaccine to prevent coronavirus disease 2019 (COVID-19).  The best way to prevent illness is to avoid being exposed to this virus.  The virus is thought to spread mainly from person-to-person. ? Between people who are in close contact with one another (within about 6 feet). ? Through respiratory droplets produced when an infected person coughs, sneezes or talks. ? These droplets can land in the mouths or noses of people who are nearby or possibly be inhaled into the lungs. ? Some recent studies have suggested that COVID-19 may be spread by people who are not showing symptoms. Everyone should Clean your hands often  Wash your hands often with soap and water for at least 20 seconds especially after you have been in a public place, or after blowing your nose, coughing, or sneezing.  If soap and water are not readily available, use a hand sanitizer that contains at least 60% alcohol. Cover all surfaces of your hands and rub them together until they  feel dry.  Avoid touching your eyes, nose, and mouth with unwashed hands. Avoid close contact  Stay home if you are sick.  Avoid close contact with people who are sick.  Put distance between yourself and other people. ? Remember that some people without symptoms may be able to spread virus. ? This is especially important for people who are at higher risk of getting very RetroStamps.it Cover your mouth and nose with a cloth face cover when around others  You could spread COVID-19 to others even if you do not feel sick.  Everyone should wear a cloth face cover when they have to go out in public, for example to the grocery store or to pick up other necessities. ? Cloth face coverings should not be placed on young children under age 44, anyone who has trouble breathing, or is unconscious, incapacitated or otherwise unable to remove the mask without assistance.  The cloth face cover is meant to protect other people in case you are infected.  Do NOT use a facemask meant for a Research scientist (physical sciences).  Continue to keep about 6 feet between yourself and others. The cloth face cover is not a substitute for social distancing. Cover coughs and sneezes  If you are in a private setting and do not have on your cloth face covering, remember to always cover your mouth and nose with a tissue when you cough or sneeze or use the inside of your elbow.  Throw used tissues in the trash.  Immediately wash your hands with soap and water for at least 20 seconds. If soap and water are not readily available, clean your hands  with a hand sanitizer that contains at least 60% alcohol. Clean and disinfect  Clean AND disinfect frequently touched surfaces daily. This includes tables, doorknobs, light switches, countertops, handles, desks, phones, keyboards, toilets, faucets, and sinks.  ktimeonline.comwww.cdc.gov/coronavirus/2019-ncov/prevent-getting-sick/disinfecting-your-home.html  If surfaces are dirty, clean them: Use detergent or soap and water prior to disinfection.  Then, use a household disinfectant. You can see a list of EPA-registered household disinfectants here. [place]Flowers.co.ukcdc.gov/coronavirus 10/12/2018 This information is not intended to replace advice given to you by your health care provider. Make sure you discuss any questions you have with your health care provider. Document Released: 09/21/2018 Document Revised: 10/20/2018 Document Reviewed: 09/21/2018 Elsevier Patient Education  2020 [employer]

## 2018-12-22 NOTE — Discharge Summary (Signed)
Physician Discharge Summary  Tyrone AppleMichael A Tom-Johnson ONG:[id number]:[id number] DOB: [birth year] DOA: 12/19/2018  PCP: Patient, No Pcp Per  Admit date: 12/19/2018 Discharge date: 12/22/2018  Admitted From: Home Disposition: Home  Recommendations for Outpatient Follow-up:  1. Follow up with PCP in 1-2 weeks as scheduled, follow-up with surgery in 3 to 4 weeks as scheduled 2. Please obtain BMP/CBC in one week  Discharge Condition: Stable CODE STATUS: Full Diet recommendation: Cardiac prudent low-salt low-fat diet  Brief/Interim Summary: 53 year old male with history of diet-controlled DM-2, HTN and HLD presenting with nausea, vomiting, diarrhea, generalized malaise/poor p.o. intake and intermittent fever for 10 days and acute severe lower abdominal pain for 1 day and found to have COVID-19 infection and acute uncomplicated appendicitis noted on CT abdomen and pelvis. CXR with no edema or consolidation but stable scarring of the RUL and stable cardiac silhouette.  CT abdomen and pelvis with acute uncomplicated appendicitis and bilateral multifocal peripheral groundglass airspace disease and hepatic steatosis.  Briefly put on 2 L by [place] and wean to room air.  He did not desaturate below 94%.  Started on IV Zosyn for appendicitis, and Solu-Medrol and Remdesivir for COVID-19 pneumonia and admitted to ICU appears to be a DVT.  Evaluated by general surgery who recommended medical therapy with IV antibiotic, IV Zosyn.  Patient admitted as above with acute abdominal pain noting to have uncomplicated appendicitis on imaging.  Surgery evaluated at previous facility indicating patient is not a candidate for procedure given improving illness on IV antibiotics in the setting of COVID positive labs.  At this time patient has improved drastically on IV Zosyn, per discussion with surgery will transition to p.o. Augmentin for an additional 7 days to complete 10-day course.  Patient otherwise ambulating without hypoxia, has no  respiratory symptoms at this time, patient did have brief episode of desaturation at previous facility with O2 around 88 to 89% on room air, this is now resolved patient ambulating without supplemental oxygen saturating of 95%.  Patient tolerating p.o. now without incident, otherwise stable and agreeable for discharge home with outpatient follow-up with PCP as well surgery as scheduled.  Discharge Diagnoses:  Principal Problem:   Pneumonia due to COVID-19 virus Active Problems:   Essential hypertension   Appendicitis   Type 2 diabetes mellitus with hyperlipidemia (HCC)   Sepsis (HCC)  Discharge Instructions Continue all medications as prescribed, follow-up with PCP and surgery as scheduled.  Call PCP or return to ED if return of symptoms: nausea, abdominal pain, fever or dyspnea.  Allergies as of 12/22/2018   No Known Allergies     Medication List    STOP taking these medications   naproxen 375 MG tablet Commonly known as: NAPROSYN     TAKE these medications   amoxicillin-clavulanate 875-125 MG tablet Commonly known as: Augmentin Take 1 tablet by mouth 2 (two) times daily for 7 days.   aspirin 81 MG EC tablet Take 81 mg by mouth daily.   CENTRUM SILVER ULTRA MENS PO Take 1 tablet by mouth daily.   cholecalciferol 25 MCG (1000 UT) tablet Commonly known as: VITAMIN D3 Take 1,000 Units by mouth daily.   Clotrimazole 1 % Oint Apply 1 application topically 2 (two) times daily. Apply copiously twice daily. Then apply steroid ointment to the bottom of the feet only.   fish oil-omega-3 fatty acids 1000 MG capsule Take 1 g by mouth daily.   lisinopril 20 MG tablet Commonly known as: ZESTRIL Take 1 tablet (20 mg total) by mouth  daily.   predniSONE 10 MG tablet Commonly known as: DELTASONE Take 4 tablets (40 mg total) by mouth daily for 3 days, THEN 3 tablets (30 mg total) daily for 3 days, THEN 2 tablets (20 mg total) daily for 3 days, THEN 1 tablet (10 mg total) daily for 3  days. Start taking on: December 22, 2018   rosuvastatin 20 MG tablet Commonly known as: CRESTOR Take 20 mg by mouth daily.   tadalafil 10 MG tablet Commonly known as: Cialis Take 1 tablet (10 mg total) by mouth daily as needed for erectile dysfunction.   traMADol 50 MG tablet Commonly known as: ULTRAM Take 1 tablet (50 mg total) by mouth every 6 (six) hours as needed.   vitamin C 500 MG tablet Commonly known as: ASCORBIC ACID Take 500 mg by mouth daily.   zinc gluconate 50 MG tablet Take 50 mg by mouth daily.      Follow-up Information    Harriette Bouillonornett, Thomas, MD Follow up on 02/04/2019.   Specialty: General Surgery Why: 11:40am, arrive by 11:15am for paperwork and check in Contact information: [address] [place] [address] [phone]        [facility], [facility] Follow up on 01/11/2019.   Specialty: General Surgery Why: 1:30pm.  This will be a televisit with Puja PA-C.  She will call you at this time for a follow up prior to seeing Dr. Luisa Hartornett in the office in 6 weeks  Contact information: [address] [place] [address] [phone]          No Known Allergies  Procedures/Studies: Ct Abdomen Pelvis W Contrast  Result Date: 12/19/2018 CLINICAL DATA:  Shortness of breath, abdominal cramping and fever. Coronavirus positive test today. EXAM: CT ABDOMEN AND PELVIS WITH CONTRAST TECHNIQUE: Multidetector CT imaging of the abdomen and pelvis was performed using the standard protocol following bolus administration of intravenous contrast. CONTRAST:  100mL OMNIPAQUE IOHEXOL 300 MG/ML  SOLN COMPARISON:  None. FINDINGS: Lower chest: Extensive peripheral patchy ground-glass airspace opacities are seen in both lungs, consistent with multifocal atypical pneumonia. Hepatobiliary: Hepatic steatosis. Normal appearance of the gallbladder. Pancreas: Unremarkable. No pancreatic ductal dilatation or surrounding inflammatory changes. Spleen: Normal in size  without focal abnormality. Adrenals/Urinary Tract: Adrenal glands are unremarkable. Kidneys are normal, without renal calculi, focal lesion, or hydronephrosis. Bladder is unremarkable. Stomach/Bowel: Normal appearance of the stomach and small bowel. The appendix is thickened inflamed with marked periappendiceal inflammatory stranding. Small appendicoliths seen within the tip of the appendix. No evidence of rupture or abscess formation. The colon is normal. Vascular/Lymphatic: Aortic atherosclerosis. No enlarged abdominal or pelvic lymph nodes. Reproductive: Prostate is unremarkable. Other: No abdominal wall hernia or abnormality. No abdominopelvic ascites. Musculoskeletal: No acute or significant osseous findings. IMPRESSION: 1. Acute appendicitis without evidence of rupture or abscess formation. 2. Bilateral multifocal peripheral ground-glass airspace disease. There are a spectrum of findings in the lungs which can be seen with acute atypical infection (as well as other non-infectious etiologies). In particular, viral pneumonia (including COVID-19) should be considered in the appropriate clinical setting. 3. Hepatic steatosis. These results were called by telephone at the time of interpretation on 12/19/2018 at 2:17 pm to Dr. Raeford RazorSTEPHEN KOHUT , who verbally acknowledged these results. Electronically Signed   By: Ted Mcalpineobrinka  Dimitrova M.D.   On: 12/19/2018 14:19   Dg Chest Portable 1 View  Result Date: 12/19/2018 CLINICAL DATA:  Fever and shortness of breath EXAM: PORTABLE CHEST 1 VIEW COMPARISON:  December 09, 2017 FINDINGS: There is no edema or consolidation. There is scarring in the right upper lobe, stable. Heart size and pulmonary vascularity are normal. No adenopathy. There is degenerative change in the thoracic spine. IMPRESSION: No edema or consolidation. Stable scarring right upper lobe. Stable cardiac silhouette. Electronically Signed   By: Bretta BangWilliam  Woodruff III M.D.   On: 12/19/2018 09:56    Subjective: No  acute issues or events overnight, tolerating p.o. well, abdominal pain essentially resolved, denies any hypoxia dyspnea with exertion, chest pain, nausea, vomiting, diarrhea, constipation, headache.  Discharge Exam: Vitals:   12/22/18 0409 12/22/18 0752  BP: 129/81   Pulse: 66   Resp: 18   Temp: 97.7 F (36.5 C) 98.1 F (36.7 C)  SpO2: 95%    Vitals:   12/21/18 1635 12/21/18 2050 12/22/18 0409 12/22/18 0752  BP: (!) 150/86 (!) 143/86 129/81   Pulse: 85 71 66   Resp:  18 18   Temp: 98.6 F (37 C) 98.6 F (37 C) 97.7 F (36.5 C) 98.1 F (36.7 C)  TempSrc: Oral Oral Oral Oral  SpO2: 94% 96% 95%   Weight:      Height:        General:  Pleasantly resting in bed, No acute distress. HEENT:  Normocephalic atraumatic.  Sclerae nonicteric, noninjected.  Extraocular movements intact bilaterally. Neck:  Without mass or deformity.  Trachea is midline. Lungs:  Clear to auscultate bilaterally without rhonchi, wheeze, or rales. Heart:  Regular rate and rhythm.  Without murmurs, rubs, or gallops. Abdomen:  Soft, nontender, nondistended.  Without guarding or rebound. Extremities: Without cyanosis, clubbing, edema, or obvious deformity. Vascular:  Dorsalis pedis and posterior tibial pulses palpable bilaterally. Skin:  Warm and dry, no erythema, no ulcerations.   The results of significant diagnostics from this hospitalization (including imaging, microbiology, ancillary and laboratory) are listed below for reference.     Microbiology: Recent Results (from the past 240 hour(s))  SARS Coronavirus 2 (CEPHEID- [facility]formed [facility]), Hosp Order     Status: Abnormal   Collection Time: 12/19/18 10:01 AM   Specimen: Nasopharyngeal Swab  Result Value Ref Range Status   SARS Coronavirus 2 POSITIVE (A) NEGATIVE Final    Comment: CRITICAL RESULT CALLED TO, READ BACK BY AND VERIFIED WITH: RN AUDREY M. 1153 [id number] FCP (NOTE) If result is NEGATIVE SARS-CoV-2 target nucleic acids  are NOT DETECTED. The SARS-CoV-2 RNA is generally detectable in upper and lower  respiratory specimens during the acute phase of infection. The lowest  concentration of SARS-CoV-2 viral copies this assay can detect is 250  copies / mL. A negative result does not preclude SARS-CoV-2 infection  and should not be used as the sole basis for treatment or other  patient management decisions.  A negative result may occur with  improper specimen collection / handling, submission of specimen other  than nasopharyngeal swab, presence of viral mutation(s) within the  areas targeted by this assay, and inadequate number of viral copies  (<250 copies / mL). A negative result must be combined with clinical  observations, patient history, and epidemiological information. If result is POSITIVE SARS-CoV-2 target nucleic acids are DETECTED.  The SARS-CoV-2 RNA is generally detectable in upper and lower  respiratory specimens during the acute phase of infection.  Positive  results are indicative of active infection with SARS-CoV-2.  Clinical  correlation with patient history and other diagnostic information is  necessary to determine patient infection status.  Positive results do  not rule out bacterial infection or co-infection with other viruses. If result is PRESUMPTIVE POSTIVE SARS-CoV-2 nucleic acids MAY BE PRESENT.   A presumptive positive result was obtained on the submitted specimen  and confirmed on repeat testing.  While 2019 novel coronavirus  (SARS-CoV-2) nucleic acids may be present in the submitted sample  additional confirmatory testing may be necessary for epidemiological  and / or clinical management purposes  to differentiate between  SARS-CoV-2 and other Sarbecovirus currently known to infect humans.  If clinically indicated additional testing with an alternate test  methodology [phone]  is advised. The SARS-CoV-2 RNA is generally  detectable in upper and lower respiratory specimens  during the acute  phase of infection. The expected result is Negative. Fact Sheet for Patients:  StrictlyIdeas.no Fact Sheet for Healthcare Providers: BankingDealers.co.za This test is not yet approved or cleared by the [place] FDA and has been authorized for detection and/or diagnosis of SARS-CoV-2 by FDA under an Emergency Use Authorization (EUA).  This EUA will remain in effect (meaning this test can be used) for the duration of the COVID-19 declaration under Section 564(b)(1) of the Act, 21 U.S.C. section 360bbb-3(b)(1), unless the authorization is terminated or revoked sooner. Performed at [facility], [place] [address], [place], [place] [address]      Labs: BNP (last 3 results) No results for input(s): BNP in the last 8760 hours. Basic Metabolic Panel: Recent Labs  Lab 12/19/18 0936 12/19/18 2006 12/20/18 0837  NA 132*  --  135  K 3.5  --  4.1  CL 98  --  99  CO2 22  --  23  GLUCOSE 132*  --  156*  BUN 11  --  11  CREATININE 1.02 0.92 0.90  CALCIUM 9.0  --  8.8*   Liver Function Tests: Recent Labs  Lab 12/19/18 0936  AST 55*  ALT 35  ALKPHOS 56  BILITOT 0.9  PROT 8.0  ALBUMIN 3.6   Recent Labs  Lab 12/19/18 0936  LIPASE 41   No results for input(s): AMMONIA in the last 168 hours. CBC: Recent Labs  Lab 12/19/18 0936 12/19/18 2006 12/20/18 0837  WBC 8.4 9.2 10.5  NEUTROABS 6.6  --   --   HGB 14.1 13.3 13.5  HCT 44.3 40.6 42.1  MCV 79.5* 78.4* 79.1*  PLT 182 169 207   Cardiac Enzymes: Recent Labs  Lab 12/20/18 0837 12/22/18 0500  CKTOTAL 2,430* 1,208*   BNP: Invalid input(s): POCBNP CBG: Recent Labs  Lab 12/21/18 0757 12/21/18 1143 12/21/18 1633 12/21/18 2110 12/22/18 0747  GLUCAP 151* 214* 196* 173* 149*   D-Dimer Recent Labs    12/20/18 0837 12/22/18 0500  DDIMER 0.65* 0.31   Hgb A1c No results for input(s): HGBA1C in the last 72 hours. Lipid Profile No results  for input(s): CHOL, HDL, LDLCALC, TRIG, CHOLHDL, LDLDIRECT in the last 72 hours. Thyroid function studies No results for input(s): TSH, T4TOTAL, T3FREE, THYROIDAB in the last 72 hours.  Invalid input(s): FREET3 Anemia work up Recent Labs    12/20/18 0837 12/22/18 0500  FERRITIN 624* 588*   Urinalysis    Component Value Date/Time   COLORURINE YELLOW 12/19/2018 1826   APPEARANCEUR CLEAR 12/19/2018 1826   LABSPEC 1.035 (H) 12/19/2018 1826   PHURINE 6.0 12/19/2018 1826   GLUCOSEU NEGATIVE 12/19/2018 1826   HGBUR NEGATIVE 12/19/2018 [place] NEGATIVE 12/19/2018 1826   BILIRUBINUR neg 02/24/2012 [place] 12/19/2018 1826   PROTEINUR  30 (A) 12/19/2018 1826   UROBILINOGEN 0.2 02/24/2012 0907   UROBILINOGEN 0.2 02/18/2012 1102   NITRITE NEGATIVE 12/19/2018 1826   LEUKOCYTESUR NEGATIVE 12/19/2018 1826   Sepsis Labs Invalid input(s): PROCALCITONIN,  WBC,  LACTICIDVEN Microbiology Recent Results (from the past 240 hour(s))  SARS Coronavirus 2 (CEPHEID- Performed in [facility] hospital lab), Hosp Order     Status: Abnormal   Collection Time: 12/19/18 10:01 AM   Specimen: Nasopharyngeal Swab  Result Value Ref Range Status   SARS Coronavirus 2 POSITIVE (A) NEGATIVE Final    Comment: CRITICAL RESULT CALLED TO, READ BACK BY AND VERIFIED WITH: RN AUDREY M. 1153 [id number] FCP (NOTE) If result is NEGATIVE SARS-CoV-2 target nucleic acids are NOT DETECTED. The SARS-CoV-2 RNA is generally detectable in upper and lower  respiratory specimens during the acute phase of infection. The lowest  concentration of SARS-CoV-2 viral copies this assay can detect is 250  copies / mL. A negative result does not preclude SARS-CoV-2 infection  and should not be used as the sole basis for treatment or other  patient management decisions.  A negative result may occur with  improper specimen collection / handling, submission of specimen other  than nasopharyngeal swab, presence of  viral mutation(s) within the  areas targeted by this assay, and inadequate number of viral copies  (<250 copies / mL). A negative result must be combined with clinical  observations, patient history, and epidemiological information. If result is POSITIVE SARS-CoV-2 target nucleic acids are DETECTED.  The SARS-CoV-2 RNA is generally detectable in upper and lower  respiratory specimens during the acute phase of infection.  Positive  results are indicative of active infection with SARS-CoV-2.  Clinical  correlation with patient history and other diagnostic information is  necessary to determine patient infection status.  Positive results do  not rule out bacterial infection or co-infection with other viruses. If result is PRESUMPTIVE POSTIVE SARS-CoV-2 nucleic acids MAY BE PRESENT.   A presumptive positive result was obtained on the submitted specimen  and confirmed on repeat testing.  While 2019 novel coronavirus  (SARS-CoV-2) nucleic acids may be present in the submitted sample  additional confirmatory testing may be necessary for epidemiological  and / or clinical management purposes  to differentiate between  SARS-CoV-2 and other Sarbecovirus currently known to infect humans.  If clinically indicated additional testing with an alternate test  methodology [phone])  is advised. The SARS-CoV-2 RNA is generally  detectable in upper and lower respiratory specimens during the acute  phase of infection. The expected result is Negative. Fact Sheet for Patients:  BoilerBrush.com.cy Fact Sheet for Healthcare Providers: https://pope.com/ This test is not yet approved or cleared by the [place] FDA and has been authorized for detection and/or diagnosis of SARS-CoV-2 by FDA under an Emergency Use Authorization (EUA).  This EUA will remain in effect (meaning this test can be used) for the duration of the COVID-19 declaration under Section  564(b)(1) of the Act, 21 U.S.C. section 360bbb-3(b)(1), unless the authorization is terminated or revoked sooner. Performed at [facility], [address], [place], [place] [address]    Time coordinating discharge: Over 30 minutes SIGNED:  Azucena Fallen, DO [facility] 12/22/2018, 12:28 PM Pager   If 7PM-7AM, please contact night-coverage [email or web address] Password [email or web address]

## 2018-12-22 NOTE — Progress Notes (Signed)
Patient educated about medicine and follow up appointments. Patient taken by wheelchair and picked up by wife.

## 2018-12-30 ENCOUNTER — Other Ambulatory Visit: Payer: Self-pay

## 2018-12-30 ENCOUNTER — Telehealth: Payer: Self-pay | Admitting: *Deleted

## 2018-12-30 DIAGNOSIS — Z20822 Contact with and (suspected) exposure to covid-19: Secondary | ICD-10-CM

## 2018-12-30 NOTE — Telephone Encounter (Signed)
Received call from [facility] requesting [phone] testing at [phone].Called contact name and number. Entered lab order. Patient will go to [facility] location today for test. Informed to wear a mask and stay in vehicle.

## 2019-01-01 LAB — NOVEL CORONAVIRUS, NAA: SARS-CoV-2, NAA: NOT DETECTED

## 2019-01-21 ENCOUNTER — Ambulatory Visit: Payer: Self-pay | Admitting: Surgery

## 2019-01-21 NOTE — H&P (View-Only) (Signed)
Raymond Harrell Documented: 01/21/2019 9:37 AM Location: [facility] Patient #: [id number] DOB: May 25, 1966 Married / Language: English / Race: Black or African American Male  History of Present Illness Raymond Moores A. Gevorg Brum MD; 01/21/2019 10:00 AM) Patient words: Patient returns to clinic for follow-up of chronic appendicitis. He was seen 4 weeks ago in the emergency room for appendicitis but had covid and pneumonia. He was treated medically and improved quickly. He had a repeat test 2 weeks later which was negative. He continues to have chronic right lower quadrant abdominal pain requiring pain medication. The pains are 5-6 out of 10 waxes and wanes but is present more than it is not. Sharp stabbing and crampy. CT showed appendicoliths at his admission. He denies chest pain or shortness of breath. He has no evidence of any hypoxemia or any sequela from previous viral pneumonia.  The patient is a 53 year old male.   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 Vital-D Rx (1MG  Tablet, Oral) Active. Fish Oil (435MG  Capsule, Oral) Active. Lisinopril (20MG  Tablet, Oral) Active. Rosuvastatin Calcium (20MG  Tablet, Oral) Active. Tadalafil (10MG  Tablet, Oral) Active. Vitamin C (100MG  Tablet, Oral) Active. Zinc (50MG  Tablet, Oral) Active. Medications Reconciled  Social History Raymond Harrell, CMA; 01/21/2019 9:38 AM) Alcohol use Moderate alcohol use. Caffeine use Coffee, Tea. No drug use Tobacco use Former smoker.  Family History Raymond Harrell, [place]; 01/21/2019 9:38 AM) Diabetes Mellitus  Father. Family history unknown First Degree Relatives Hypertension Father.  Other Problems Raymond Harrell, CMA; 01/21/2019 9:38 AM) Diabetes Mellitus High blood pressure Hypercholesterolemia     Review of Systems (Raymond Harrell CMA; 01/21/2019 9:38 AM) General Not Present- Appetite Loss, Chills, Fatigue, Fever, Night Sweats, Weight Gain and Weight Loss. Skin Not Present- Change in Wart/Mole, Dryness, Hives, Jaundice, New Lesions, Non-Healing Wounds, Rash and Ulcer. HEENT Present- Wears glasses/contact lenses. Not Present- Earache, Hearing Loss, Hoarseness, Nose Bleed, Oral Ulcers, Ringing in the Ears, Seasonal Allergies, Sinus Pain, Sore Throat, Visual Disturbances and Yellow Eyes. Respiratory Not Present- Bloody sputum, Chronic Cough, Difficulty Breathing, Snoring and Wheezing. Cardiovascular Not Present- Chest Pain, Difficulty Breathing Lying Down, Leg Cramps, Palpitations, Rapid Heart Rate, Shortness of Breath and Swelling of Extremities. Gastrointestinal Present- Abdominal Pain. Not Present- Bloating, Bloody Stool, Change in Bowel Habits, Chronic diarrhea, Constipation, Difficulty Swallowing, Excessive gas, Gets full quickly at meals, Hemorrhoids, Indigestion, Nausea, Rectal Pain and Vomiting. Male Genitourinary Not Present- Blood in Urine, Change in Urinary Stream, Frequency, Impotence, Nocturia, Painful Urination, Urgency and Urine Leakage. Musculoskeletal Not Present- Back Pain, Joint Pain, Joint Stiffness, Muscle Pain, Muscle Weakness and Swelling of Extremities. Neurological Present- Headaches. Not Present- Decreased Memory, Fainting, Numbness, Seizures, Tingling, Tremor, Trouble walking and Weakness. Psychiatric Not Present- Anxiety, Bipolar, Change in Sleep Pattern, Depression, Fearful and Frequent crying. Endocrine Not Present- Cold Intolerance, Excessive Hunger, Hair Changes, Heat Intolerance, Hot flashes and New Diabetes. Hematology Not Present- Blood Thinners, Easy  Bruising, Excessive bleeding, Gland problems, HIV and Persistent Infections.  Vitals (Raymond Harrell CMA; 01/21/2019 9:42 AM) 01/21/2019 9:41 AM Weight: 260 lb Height: 76in Body Surface Area: 2.48 m Body Mass Index: 31.65 kg/m  Temp.: 96.18F(Temporal)  Pulse: 102 (Regular)  BP: 156/84 (Sitting, Left Arm, Standard)  Physical Exam (Raymond Weinheimer A. Ottilia Pippenger MD; 01/21/2019 10:00 AM)  General Mental Status-Alert. General Appearance-Consistent with stated age. Hydration-Well hydrated. Voice-Normal.  Chest and Lung Exam Chest and lung exam reveals -quiet, even and easy respiratory effort with no use of accessory muscles and on auscultation, normal breath sounds, no adventitious sounds and normal vocal resonance. Inspection Chest Wall - Normal. Back - normal.  Cardiovascular Cardiovascular examination reveals -normal heart sounds, regular rate and rhythm with no murmurs and normal pedal pulses bilaterally.  Abdomen Note: Mild tenderness right lower quadrant. No rebound or guarding.  Musculoskeletal Normal Exam - Left-Upper Extremity Strength Normal and Lower Extremity Strength Normal. Normal Exam - Right-Upper Extremity Strength Normal and Lower Extremity Strength Normal.    Assessment & Plan (Raymond Mareno A. Fern Asmar MD; 01/21/2019 9:57 AM)  APPENDICITIS (K37) Impression: Patient has recovered and is now covid negative on testing. He continues to have intermittent right upper arm pain and does have appendicolith on CT scanning. He is over 4 weeks out but I feel get his symptoms that attempted laparoscopic possible open appendectomy necessary for pain control. He is agreeable to proceed. Discussed potential need for open surgery and potential pulmonary complications brought I feel most of his pneumonia has resolved at this point being well over 4 weeks out and not having any residual significant shortness of breath or chest pain at this point. Discussed potential  risk with the patient and make and potential complications of surgery. Risk of bleeding, infection, open surgery, bowel resection, wound complications potential pulmonary complications given his past history but again I feel his symptoms have resolved enough to proceed and he is having significant pain at this point.  Current Plans Pt Education - [facility] General Post-op HCI Pt Education - [facility] Laparoscopic Surgery HCI The anatomy & physiology of the digestive tract was discussed. The pathophysiology of appendicitis and other appendiceal disorders were discussed. Natural history risks without surgery was discussed. I feel the risks of no intervention will lead to serious problems that outweigh the operative risks; therefore, I recommended diagnostic laparoscopy with removal of appendix to remove the pathology. Laparoscopic & open techniques were discussed. I noted a good likelihood this will help address the problem. Risks such as bleeding, infection, abscess, leak, reoperation, possible ostomy, hernia, heart attack, death, and other risks were discussed. Goals of post-operative recovery were discussed as well. We will work to minimize complications. Questions were answered. The patient expresses understanding & wishes to proceed with surgery.

## 2019-01-21 NOTE — H&P (Signed)
Raymond Harrell Documented: 01/21/2019 9:37 AM Location: [facility] Patient #: [id number] DOB: May 25, 1966 Married / Language: English / Race: Black or African American Male  History of Present Illness Raymond Harrell A. [name]; 01/21/2019 10:00 AM) Patient words: Patient returns to clinic for follow-up of chronic appendicitis. He was seen 4 weeks ago in the emergency room for appendicitis but had covid and pneumonia. He was treated medically and improved quickly. He had a repeat test 2 weeks later which was negative. He continues to have chronic right lower quadrant abdominal pain requiring pain medication. The pains are 5-6 out of 10 waxes and wanes but is present more than it is not. Sharp stabbing and crampy. CT showed appendicoliths at his admission. He denies chest pain or shortness of breath. He has no evidence of any hypoxemia or any sequela from previous viral pneumonia.  The patient is a 53 year old male.   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 Vital-D Rx (1MG  Tablet, Oral) Active. Fish Oil (435MG  Capsule, Oral) Active. Lisinopril (20MG  Tablet, Oral) Active. Rosuvastatin Calcium (20MG  Tablet, Oral) Active. Tadalafil (10MG  Tablet, Oral) Active. Vitamin C (100MG  Tablet, Oral) Active. Zinc (50MG  Tablet, Oral) Active. Medications Reconciled  Social History Raymond Harrell, CMA; 01/21/2019 9:38 AM) Alcohol use Moderate alcohol use. Caffeine use Coffee, Tea. No drug use Tobacco use Former smoker.  Family History Raymond Harrell, [place]; 01/21/2019 9:38 AM) Diabetes Mellitus  Father. Family history unknown First Degree Relatives Hypertension Father.  Other Problems Raymond Harrell, CMA; 01/21/2019 9:38 AM) Diabetes Mellitus High blood pressure Hypercholesterolemia     Review of Systems (Raymond Harrell CMA; 01/21/2019 9:38 AM) General Not Present- Appetite Loss, Chills, Fatigue, Fever, Night Sweats, Weight Gain and Weight Loss. Skin Not Present- Change in Wart/Mole, Dryness, Hives, Jaundice, New Lesions, Non-Healing Wounds, Rash and Ulcer. HEENT Present- Wears glasses/contact lenses. Not Present- Earache, Hearing Loss, Hoarseness, Nose Bleed, Oral Ulcers, Ringing in the Ears, Seasonal Allergies, Sinus Pain, Sore Throat, Visual Disturbances and Yellow Eyes. Respiratory Not Present- Bloody sputum, Chronic Cough, Difficulty Breathing, Snoring and Wheezing. Cardiovascular Not Present- Chest Pain, Difficulty Breathing Lying Down, Leg Cramps, Palpitations, Rapid Heart Rate, Shortness of Breath and Swelling of Extremities. Gastrointestinal Present- Abdominal Pain. Not Present- Bloating, Bloody Stool, Change in Bowel Habits, Chronic diarrhea, Constipation, Difficulty Swallowing, Excessive gas, Gets full quickly at meals, Hemorrhoids, Indigestion, Nausea, Rectal Pain and Vomiting. Male Genitourinary Not Present- Blood in Urine, Change in Urinary Stream, Frequency, Impotence, Nocturia, Painful Urination, Urgency and Urine Leakage. Musculoskeletal Not Present- Back Pain, Joint Pain, Joint Stiffness, Muscle Pain, Muscle Weakness and Swelling of Extremities. Neurological Present- Headaches. Not Present- Decreased Memory, Fainting, Numbness, Seizures, Tingling, Tremor, Trouble walking and Weakness. Psychiatric Not Present- Anxiety, Bipolar, Change in Sleep Pattern, Depression, Fearful and Frequent crying. Endocrine Not Present- Cold Intolerance, Excessive Hunger, Hair Changes, Heat Intolerance, Hot flashes and New Diabetes. Hematology Not Present- Blood Thinners, Easy  Bruising, Excessive bleeding, Gland problems, HIV and Persistent Infections.  Vitals (Raymond Harrell CMA; 01/21/2019 9:42 AM) 01/21/2019 9:41 AM Weight: 260 lb Height: 76in Body Surface Area: 2.48 m Body Mass Index: 31.65 kg/m  Temp.: 96.18F(Temporal)  Pulse: 102 (Regular)  BP: 156/84 (Sitting, Left Arm, Standard)  Physical Exam (Raymond Noxon A. [name]; 01/21/2019 10:00 AM)  General Mental Status-Alert. General Appearance-Consistent with stated age. Hydration-Well hydrated. Voice-Normal.  Chest and Lung Exam Chest and lung exam reveals -quiet, even and easy respiratory effort with no use of accessory muscles and on auscultation, normal breath sounds, no adventitious sounds and normal vocal resonance. Inspection Chest Wall - Normal. Back - normal.  Cardiovascular Cardiovascular examination reveals -normal heart sounds, regular rate and rhythm with no murmurs and normal pedal pulses bilaterally.  Abdomen Note: Mild tenderness right lower quadrant. No rebound or guarding.  Musculoskeletal Normal Exam - Left-Upper Extremity Strength Normal and Lower Extremity Strength Normal. Normal Exam - Right-Upper Extremity Strength Normal and Lower Extremity Strength Normal.    Assessment & Plan (Raymond Narayan A. [name]; 01/21/2019 9:57 AM)  APPENDICITIS (K37) Impression: Patient has recovered and is now covid negative on testing. He continues to have intermittent right upper arm pain and does have appendicolith on CT scanning. He is over 4 weeks out but I feel get his symptoms that attempted laparoscopic possible open appendectomy necessary for pain control. He is agreeable to proceed. Discussed potential need for open surgery and potential pulmonary complications brought I feel most of his pneumonia has resolved at this point being well over 4 weeks out and not having any residual significant shortness of breath or chest pain at this point. Discussed potential  risk with the patient and make and potential complications of surgery. Risk of bleeding, infection, open surgery, bowel resection, wound complications potential pulmonary complications given his past history but again I feel his symptoms have resolved enough to proceed and he is having significant pain at this point.  Current Plans Pt Education - [facility] General Post-op HCI Pt Education - [facility] Laparoscopic Surgery HCI The anatomy & physiology of the digestive tract was discussed. The pathophysiology of appendicitis and other appendiceal disorders were discussed. Natural history risks without surgery was discussed. I feel the risks of no intervention will lead to serious problems that outweigh the operative risks; therefore, I recommended diagnostic laparoscopy with removal of appendix to remove the pathology. Laparoscopic & open techniques were discussed. I noted a good likelihood this will help address the problem. Risks such as bleeding, infection, abscess, leak, reoperation, possible ostomy, hernia, heart attack, death, and other risks were discussed. Goals of post-operative recovery were discussed as well. We will work to minimize complications. Questions were answered. The patient expresses understanding & wishes to proceed with surgery. 

## 2019-01-25 ENCOUNTER — Other Ambulatory Visit (HOSPITAL_COMMUNITY)
Admission: RE | Admit: 2019-01-25 | Discharge: 2019-01-25 | Disposition: A | Discharge status: Home / Self Care | Payer: Managed Care, Other (non HMO) | Source: Ambulatory Visit | Attending: Surgery | Admitting: Surgery

## 2019-01-25 DIAGNOSIS — Z20828 Contact with and (suspected) exposure to other viral communicable diseases: Secondary | ICD-10-CM | POA: Diagnosis not present

## 2019-01-25 DIAGNOSIS — Z01812 Encounter for preprocedural laboratory examination: Secondary | ICD-10-CM | POA: Insufficient documentation

## 2019-01-25 LAB — SARS CORONAVIRUS 2 (TAT 6-24 HRS): SARS Coronavirus 2: NEGATIVE

## 2019-01-27 ENCOUNTER — Other Ambulatory Visit: Payer: Self-pay

## 2019-01-27 ENCOUNTER — Encounter (HOSPITAL_COMMUNITY): Payer: Self-pay | Admitting: *Deleted

## 2019-01-27 MED ORDER — SODIUM CHLORIDE 0.9 % IV SOLN
2.0000 g | INTRAVENOUS | Status: AC
  Administered 2019-01-28: 2 g via INTRAVENOUS
  Filled 2019-01-27: qty 2

## 2019-01-27 NOTE — Progress Notes (Addendum)
Mr. Raymond Harrell denies chest pain or shortness of breath.  Patient tested negative for Covid 01/25/2019 and has been in quarantine with family since that time. Mr Raymond Harrell had a positive Covid test in July 2020. Mr Raymond Harrell reports that he has not had chest pain in a long time- "I lost over 50 lbs and I'moff diabetes medication and no chest pain."    PCP is Dr Osborne Casco, I requested last officee visit and labs.  I asked anesthesia to review.  Mr. Raymond Harrell said his wife is driving him home and that his wife Raymond Harrell is the only one we should speak with.

## 2019-01-27 NOTE — Progress Notes (Addendum)
Anesthesia Chart Review: Raymond Harrell   Case: [id number] Date/Time: 01/28/19 0715   Procedure: LAPAROSCOPIC APPENDECTOMY (N/A )   Anesthesia type: General   Pre-op diagnosis: CHRONIC APPENDICITIS   Location: [facility] OR ROOM 01 / [place] [facility]   Surgeon: Erroll Luna, MD      DISCUSSION: Patient is a 53 year old male scheduled for the above procedure.  History includes former smoker, DM2 (diet controlled), HTN, chest pain (2010, 2013 with cardiology evaluations, chronic lateral T wave abnormality), COVID-19 + 12/19/18 (negative f/u testing 12/30/18 and 01/25/19). 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   - Admitted 12/19/18-12/22/18 with N/V/D, malaise, poor intake, intermittent fever X 10 days with acute abdomina pain and found to have COVID-19 infection and acute uncomplicated appendicitis. Started on IV Zosyn for appendicitis, and Solu-Medrol and Remdesivir for COVID-19 pneumonia and admitted to ICU. Evaluated by general surgery who recommended medical therapy with IV antibiotic, IV Zosyn. Clinically improving, so discharged on oral antibiotics and general surgery follow-up.   He is a same day work-up, so he will get labs and anesthesiologist evaluation on arrival.   VS: He will get vitals on arrival.  BP Readings from Last 3 Encounters:  12/22/18 (!) 151/91  12/09/17 (!) 161/98  09/17/16 (!) 149/82    PROVIDERS: - No PCP lislted He is not followed by cardiology on a regular basis, but saw Lenus, Trauger in 2020 for chest pain with lateral T wave inversion and had normal nuclear stress test. He saw Bing Quarry, MD in ED on 02/19/12 for pleuritic chest pain and pulmonology consult recommended. CTA negative for PE. Troponins negative. Of note, TB quantiferon test result indicated prior exposure to TB, but no active TB. According to 02/24/12 note by Melina Copa, PA-C, "Dr. 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 Dr. Halford Chessman did not think he needed to f/u with pulmonology."   LABS: He will get updated labs on the day of surgery.  No recent A1c seen.   IMAGES: 1V PCXR 12/19/18: FINDINGS: There is no edema or consolidation. There is scarring in the right upper lobe, stable. Heart size and pulmonary vascularity are normal. No adenopathy. There is degenerative change in the thoracic spine. IMPRESSION: No edema or consolidation. Stable scarring right upper lobe. Stable cardiac silhouette.   CT abd/pelvis 12/19/18: IMPRESSION: 1. Acute appendicitis without evidence of rupture or abscess formation. 2. Bilateral multifocal peripheral ground-glass airspace disease. There are a spectrum of findings in the lungs which can be seen with acute atypical infection (as well as other non-infectious etiologies). In particular, viral pneumonia (including COVID-19) should be considered in the appropriate clinical setting. 3. Hepatic steatosis.   EKG: 12/19/18: ST at 100 bpm. Non-specific lateral T wave abnormality. - He has had lateral T wave abnormality/inversions present on EKGs since at least 02/2012 and had normal stress test at that time.   CV: Echo 02/18/12 (as outlined in 02/19/12 note by Dr. Lia Foyer): Study Conclusions - Left ventricle: The cavity size was at the upper limits of normal. Wall thickness was increased increased in a pattern of mild to moderate LVH. Systolic function was normal. The estimated ejection fraction was in the range of 55% to 60%. Wall motion was normal; there were no regional wall motion abnormalities. The study is not technically sufficient to allow evaluation of LV diastolic function. - Left atrium: The atrium  was mildly dilated   Nuclear stress test 05/09/09: Normal nuclear study. EF 57%.   ETT 06/25/06:  Conclusions: Negative exercise ECG.   Past Medical History:  Diagnosis Date  . Abnormal CT scan    a. CT angio  incidentally noted nodular opacity in upper abdomen felt lymph node vs part of pancreas, felt to be benign lesion with stable appearance.  . Chest pain    a. Negative nuc 2010 (abnormal baseline EKG). b. Adm with pleuritic CP 02/2012: negative cardiac enzymes, CTA negative for PE  but did show bronchiectasis/scarring RUL.  . Diabetes mellitus (HCC)    Diagnosed 02/2012  . Elevated blood pressure    Noted 02/2012  . Elevated LDL cholesterol level    Noted 02/2012    Past Surgical History:  Procedure Laterality Date  . FRACTURE SURGERY    . HAND TENDON SURGERY      MEDICATIONS: . Melene Muller[START ON 01/28/2019] cefoTEtan (CEFOTAN) 2 g in sodium chloride 0.9 % 100 mL IVPB   . acetaminophen (TYLENOL) 500 MG tablet  . Garlic 1000 MG CAPS  . lisinopril (PRINIVIL,ZESTRIL) 20 MG tablet  . Multiple Vitamin (MULTIVITAMIN WITH MINERALS) TABS tablet  . Omega-3 Fatty Acids (FISH OIL) 500 MG CAPS  . rosuvastatin (CRESTOR) 20 MG tablet  . vitamin C (ASCORBIC ACID) 500 MG tablet  . aspirin 81 MG EC tablet  ASA on hold for surgery.   Shonna ChockAllison Sheniece Ruggles, PA-C Surgical Short Stay/[facility]esthesiology [facility] Phone [phone] [facility] Phone [phone] 01/27/2019 12:10 PM

## 2019-01-27 NOTE — Anesthesia Preprocedure Evaluation (Addendum)
Anesthesia Evaluation  Patient identified by MRN, date of birth, ID band Patient awake    Reviewed: Allergy & Precautions, NPO status , Patient's Chart, lab work & pertinent test results  History of Anesthesia Complications Negative for: history of anesthetic complications  Airway Mallampati: II  TM Distance: >3 FB Neck ROM: Full    Dental  (+) Missing,    Pulmonary pneumonia, resolved, former smoker,  Covid pneumonia in July 2020, hospitalized 7/12-7/15/20, symptoms now resolved with negative Covid swabs 12/30/18 and 01/25/19   Pulmonary exam normal        Cardiovascular hypertension, Pt. on medications Normal cardiovascular exam     Neuro/Psych negative neurological ROS     GI/Hepatic negative GI ROS, Neg liver ROS,   Endo/Other  diabetes, Type 2Hypothyroidism   Renal/GU negative Renal ROS     Musculoskeletal negative musculoskeletal ROS (+)   Abdominal   Peds  Hematology negative hematology ROS (+)   Anesthesia Other Findings Day of surgery medications reviewed with the patient.  Reproductive/Obstetrics                           Anesthesia Physical Anesthesia Plan  ASA: II  Anesthesia Plan: General   Post-op Pain Management:    Induction: Intravenous  PONV Risk Score and Plan: 3 and Treatment may vary due to age or medical condition, Ondansetron, Dexamethasone and Midazolam  Airway Management Planned: Oral ETT  Additional Equipment:   Intra-op Plan:   Post-operative Plan: Extubation in OR  Informed Consent: I have reviewed the patients History and Physical, chart, labs and discussed the procedure including the risks, benefits and alternatives for the proposed anesthesia with the patient or authorized representative who has indicated his/her understanding and acceptance.     Dental advisory given  Plan Discussed with: CRNA  Anesthesia Plan Comments: (See PAT note  written 01/27/2019 by Myra Gianotti, PA-C. Hospitalized with COVID-19 and acute appendicitis 12/19/18.   COVID-19 + 12/19/18 (negative f/u testing 12/30/18 and 01/25/19).)      Anesthesia Quick Evaluation

## 2019-01-28 ENCOUNTER — Ambulatory Visit (HOSPITAL_COMMUNITY): Payer: Managed Care, Other (non HMO) | Admitting: Vascular Surgery

## 2019-01-28 ENCOUNTER — Encounter (HOSPITAL_COMMUNITY)
Admission: RE | Disposition: A | Discharge status: Home / Self Care | Payer: Self-pay | Source: Home / Self Care | Attending: Surgery

## 2019-01-28 ENCOUNTER — Ambulatory Visit (HOSPITAL_COMMUNITY)
Admission: RE | Admit: 2019-01-28 | Discharge: 2019-01-28 | Disposition: A | Discharge status: Home / Self Care | Payer: Managed Care, Other (non HMO) | Attending: Surgery | Admitting: Surgery

## 2019-01-28 DIAGNOSIS — R030 Elevated blood-pressure reading, without diagnosis of hypertension: Secondary | ICD-10-CM | POA: Insufficient documentation

## 2019-01-28 DIAGNOSIS — Z87891 Personal history of nicotine dependence: Secondary | ICD-10-CM | POA: Insufficient documentation

## 2019-01-28 DIAGNOSIS — K381 Appendicular concretions: Secondary | ICD-10-CM | POA: Insufficient documentation

## 2019-01-28 DIAGNOSIS — Z8249 Family history of ischemic heart disease and other diseases of the circulatory system: Secondary | ICD-10-CM | POA: Diagnosis not present

## 2019-01-28 DIAGNOSIS — Z7982 Long term (current) use of aspirin: Secondary | ICD-10-CM | POA: Diagnosis not present

## 2019-01-28 DIAGNOSIS — Z833 Family history of diabetes mellitus: Secondary | ICD-10-CM | POA: Insufficient documentation

## 2019-01-28 DIAGNOSIS — Z79899 Other long term (current) drug therapy: Secondary | ICD-10-CM | POA: Insufficient documentation

## 2019-01-28 DIAGNOSIS — E7800 Pure hypercholesterolemia, unspecified: Secondary | ICD-10-CM | POA: Insufficient documentation

## 2019-01-28 DIAGNOSIS — E119 Type 2 diabetes mellitus without complications: Secondary | ICD-10-CM | POA: Diagnosis not present

## 2019-01-28 DIAGNOSIS — K36 Other appendicitis: Secondary | ICD-10-CM | POA: Diagnosis present

## 2019-01-28 DIAGNOSIS — Z9081 Acquired absence of spleen: Secondary | ICD-10-CM | POA: Diagnosis not present

## 2019-01-28 HISTORY — DX: Personal history of urinary calculi: Z87.442

## 2019-01-28 HISTORY — DX: Essential (primary) hypertension: I10

## 2019-01-28 HISTORY — PX: LAPAROSCOPIC APPENDECTOMY: SHX408

## 2019-01-28 LAB — COMPREHENSIVE METABOLIC PANEL
ALT: 37 U/L (ref 0–44)
AST: 32 U/L (ref 15–41)
Albumin: 3.8 g/dL (ref 3.5–5.0)
Alkaline Phosphatase: 55 U/L (ref 38–126)
Anion gap: 11 (ref 5–15)
BUN: 14 mg/dL (ref 6–20)
CO2: 21 mmol/L — ABNORMAL LOW (ref 22–32)
Calcium: 9.1 mg/dL (ref 8.9–10.3)
Chloride: 105 mmol/L (ref 98–111)
Creatinine, Ser: 0.89 mg/dL (ref 0.61–1.24)
GFR calc Af Amer: >60 mL/min (ref >60)
GFR calc non Af Amer: >60 mL/min (ref >60)
Glucose, Bld: 160 mg/dL — ABNORMAL HIGH (ref 70–99)
Potassium: 3.7 mmol/L (ref 3.5–5.1)
Sodium: 137 mmol/L (ref 135–145)
Total Bilirubin: 0.7 mg/dL (ref 0.3–1.2)
Total Protein: 6.9 g/dL (ref 6.5–8.1)

## 2019-01-28 LAB — CBC WITH DIFFERENTIAL/PLATELET
Abs Immature Granulocytes: 0.03 10*3/uL (ref 0.00–0.07)
Basophils Absolute: 0 10*3/uL (ref 0.0–0.1)
Basophils Relative: 1 %
Eosinophils Absolute: 0.1 10*3/uL (ref 0.0–0.5)
Eosinophils Relative: 2 %
HCT: 41 % (ref 39.0–52.0)
Hemoglobin: 12.5 g/dL — ABNORMAL LOW (ref 13.0–17.0)
Immature Granulocytes: 1 %
Lymphocytes Relative: 40 %
Lymphs Abs: 2.2 10*3/uL (ref 0.7–4.0)
MCH: 25.7 pg — ABNORMAL LOW (ref 26.0–34.0)
MCHC: 30.5 g/dL (ref 30.0–36.0)
MCV: 84.4 fL (ref 80.0–100.0)
Monocytes Absolute: 0.8 10*3/uL (ref 0.1–1.0)
Monocytes Relative: 15 %
Neutro Abs: 2.3 10*3/uL (ref 1.7–7.7)
Neutrophils Relative %: 41 %
Platelets: 241 10*3/uL (ref 150–400)
RBC: 4.86 MIL/uL (ref 4.22–5.81)
RDW: 16.1 % — ABNORMAL HIGH (ref 11.5–15.5)
WBC: 5.4 10*3/uL (ref 4.0–10.5)
nRBC: 0 % (ref 0.0–0.2)

## 2019-01-28 LAB — GLUCOSE, CAPILLARY: Glucose-Capillary: 156 mg/dL — ABNORMAL HIGH (ref 70–99)

## 2019-01-28 SURGERY — APPENDECTOMY, LAPAROSCOPIC
Anesthesia: General | Site: Abdomen

## 2019-01-28 MED ORDER — CELECOXIB 200 MG PO CAPS
200.0000 mg | ORAL_CAPSULE | ORAL | Status: AC
  Administered 2019-01-28: 200 mg via ORAL
  Filled 2019-01-28: qty 1

## 2019-01-28 MED ORDER — AMOXICILLIN-POT CLAVULANATE 875-125 MG PO TABS: 1.0000 | ORAL_TABLET | Freq: Two times a day (BID) | ORAL | 0 refills | Status: DC

## 2019-01-28 MED ORDER — MIDAZOLAM HCL 2 MG/2ML IJ SOLN
INTRAMUSCULAR | Status: AC
  Filled 2019-01-28: qty 2

## 2019-01-28 MED ORDER — HYDROMORPHONE HCL 1 MG/ML IJ SOLN: 0.2500 mg | INTRAMUSCULAR | Status: DC | PRN

## 2019-01-28 MED ORDER — PROPOFOL 10 MG/ML IV BOLUS
INTRAVENOUS | Status: AC
  Filled 2019-01-28: qty 20

## 2019-01-28 MED ORDER — OXYCODONE HCL 5 MG/5ML PO SOLN: 5.0000 mg | Freq: Once | ORAL | Status: AC | PRN

## 2019-01-28 MED ORDER — FENTANYL CITRATE (PF) 250 MCG/5ML IJ SOLN
INTRAMUSCULAR | Status: DC | PRN
  Administered 2019-01-28 (×2): 25 ug via INTRAVENOUS
  Administered 2019-01-28: 100 ug via INTRAVENOUS
  Administered 2019-01-28 (×2): 25 ug via INTRAVENOUS
  Administered 2019-01-28: 50 ug via INTRAVENOUS

## 2019-01-28 MED ORDER — BUPIVACAINE-EPINEPHRINE (PF) 0.25% -1:200000 IJ SOLN
INTRAMUSCULAR | Status: DC | PRN
  Administered 2019-01-28: 5 mL

## 2019-01-28 MED ORDER — ROCURONIUM BROMIDE 10 MG/ML (PF) SYRINGE
PREFILLED_SYRINGE | INTRAVENOUS | Status: AC
  Filled 2019-01-28: qty 10

## 2019-01-28 MED ORDER — ONDANSETRON HCL 4 MG/2ML IJ SOLN
INTRAMUSCULAR | Status: AC
  Filled 2019-01-28: qty 2

## 2019-01-28 MED ORDER — LIDOCAINE 2% (20 MG/ML) 5 ML SYRINGE
INTRAMUSCULAR | Status: DC | PRN
  Administered 2019-01-28: 100 mg via INTRAVENOUS
  Administered 2019-01-28: 40 mg via INTRAVENOUS
  Administered 2019-01-28: 60 mg via INTRAVENOUS

## 2019-01-28 MED ORDER — KETAMINE HCL 10 MG/ML IJ SOLN
INTRAMUSCULAR | Status: DC | PRN
  Administered 2019-01-28: 10 mg via INTRAVENOUS
  Administered 2019-01-28: 40 mg via INTRAVENOUS

## 2019-01-28 MED ORDER — SUGAMMADEX SODIUM 500 MG/5ML IV SOLN
INTRAVENOUS | Status: DC | PRN
  Administered 2019-01-28: 240 mg via INTRAVENOUS

## 2019-01-28 MED ORDER — PHENYLEPHRINE 40 MCG/ML (10ML) SYRINGE FOR IV PUSH (FOR BLOOD PRESSURE SUPPORT)
PREFILLED_SYRINGE | INTRAVENOUS | Status: DC | PRN
  Administered 2019-01-28 (×2): 80 ug via INTRAVENOUS
  Administered 2019-01-28: 120 ug via INTRAVENOUS
  Administered 2019-01-28: 80 ug via INTRAVENOUS
  Administered 2019-01-28: 40 ug via INTRAVENOUS

## 2019-01-28 MED ORDER — SODIUM CHLORIDE 0.9 % IR SOLN
Status: DC | PRN
  Administered 2019-01-28: 1000 mL

## 2019-01-28 MED ORDER — GABAPENTIN 300 MG PO CAPS
300.0000 mg | ORAL_CAPSULE | ORAL | Status: AC
  Administered 2019-01-28: 300 mg via ORAL
  Filled 2019-01-28: qty 1

## 2019-01-28 MED ORDER — MIDAZOLAM HCL 5 MG/5ML IJ SOLN
INTRAMUSCULAR | Status: DC | PRN
  Administered 2019-01-28: 2 mg via INTRAVENOUS

## 2019-01-28 MED ORDER — ACETAMINOPHEN 500 MG PO TABS
1000.0000 mg | ORAL_TABLET | ORAL | Status: AC
  Administered 2019-01-28: 1000 mg via ORAL
  Filled 2019-01-28: qty 2

## 2019-01-28 MED ORDER — DEXAMETHASONE SODIUM PHOSPHATE 10 MG/ML IJ SOLN
INTRAMUSCULAR | Status: AC
  Filled 2019-01-28: qty 1

## 2019-01-28 MED ORDER — ROCURONIUM BROMIDE 10 MG/ML (PF) SYRINGE
PREFILLED_SYRINGE | INTRAVENOUS | Status: DC | PRN
  Administered 2019-01-28: 80 mg via INTRAVENOUS
  Administered 2019-01-28: 20 mg via INTRAVENOUS

## 2019-01-28 MED ORDER — BUPIVACAINE-EPINEPHRINE (PF) 0.25% -1:200000 IJ SOLN
INTRAMUSCULAR | Status: AC
  Filled 2019-01-28: qty 30

## 2019-01-28 MED ORDER — CHLORHEXIDINE GLUCONATE CLOTH 2 % EX PADS: 6.0000 | MEDICATED_PAD | Freq: Once | CUTANEOUS | Status: DC

## 2019-01-28 MED ORDER — 0.9 % SODIUM CHLORIDE (POUR BTL) OPTIME
TOPICAL | Status: DC | PRN
  Administered 2019-01-28: 1000 mL

## 2019-01-28 MED ORDER — ACETAMINOPHEN 10 MG/ML IV SOLN: 1000.0000 mg | Freq: Once | INTRAVENOUS | Status: DC | PRN

## 2019-01-28 MED ORDER — KETAMINE HCL 50 MG/5ML IJ SOSY
PREFILLED_SYRINGE | INTRAMUSCULAR | Status: AC
  Filled 2019-01-28: qty 5

## 2019-01-28 MED ORDER — OXYCODONE HCL 5 MG PO TABS
5.0000 mg | ORAL_TABLET | Freq: Once | ORAL | Status: AC | PRN
  Administered 2019-01-28: 5 mg via ORAL

## 2019-01-28 MED ORDER — LACTATED RINGERS IV SOLN
INTRAVENOUS | Status: DC | PRN
  Administered 2019-01-28 (×2): via INTRAVENOUS

## 2019-01-28 MED ORDER — IBUPROFEN 800 MG PO TABS: 800.0000 mg | ORAL_TABLET | Freq: Three times a day (TID) | ORAL | 0 refills | Status: DC | PRN

## 2019-01-28 MED ORDER — LIDOCAINE 2% (20 MG/ML) 5 ML SYRINGE
INTRAMUSCULAR | Status: AC
  Filled 2019-01-28: qty 5

## 2019-01-28 MED ORDER — EPHEDRINE SULFATE-NACL 50-0.9 MG/10ML-% IV SOSY
PREFILLED_SYRINGE | INTRAVENOUS | Status: DC | PRN
  Administered 2019-01-28 (×2): 5 mg via INTRAVENOUS

## 2019-01-28 MED ORDER — GLYCOPYRROLATE PF 0.2 MG/ML IJ SOSY
PREFILLED_SYRINGE | INTRAMUSCULAR | Status: DC | PRN
  Administered 2019-01-28: .1 mg via INTRAVENOUS

## 2019-01-28 MED ORDER — PROPOFOL 10 MG/ML IV BOLUS
INTRAVENOUS | Status: DC | PRN
  Administered 2019-01-28: 200 mg via INTRAVENOUS
  Administered 2019-01-28: 70 mg via INTRAVENOUS

## 2019-01-28 MED ORDER — STERILE WATER FOR IRRIGATION IR SOLN
Status: DC | PRN
  Administered 2019-01-28: 1000 mL

## 2019-01-28 MED ORDER — FENTANYL CITRATE (PF) 250 MCG/5ML IJ SOLN
INTRAMUSCULAR | Status: AC
  Filled 2019-01-28: qty 5

## 2019-01-28 MED ORDER — PROMETHAZINE HCL 25 MG/ML IJ SOLN: 6.2500 mg | INTRAMUSCULAR | Status: DC | PRN

## 2019-01-28 MED ORDER — DEXAMETHASONE SODIUM PHOSPHATE 10 MG/ML IJ SOLN
INTRAMUSCULAR | Status: DC | PRN
  Administered 2019-01-28: 8 mg via INTRAVENOUS

## 2019-01-28 MED ORDER — OXYCODONE HCL 5 MG PO TABS: 5.0000 mg | ORAL_TABLET | Freq: Four times a day (QID) | ORAL | 0 refills | Status: DC | PRN

## 2019-01-28 SURGICAL SUPPLY — 48 items
ADH SKN CLS APL DERMABOND .7 (GAUZE/BANDAGES/DRESSINGS) ×1
APL PRP STRL LF DISP 70% ISPRP (MISCELLANEOUS) ×1
APPLIER CLIP ROT 10 11.4 M/L (STAPLE)
APR CLP MED LRG 11.4X10 (STAPLE)
BAG SPEC RTRVL 10 TROC 200 (ENDOMECHANICALS) ×1
BLADE CLIPPER SURG (BLADE) IMPLANT
CANISTER SUCT 3000ML PPV (MISCELLANEOUS) ×2 IMPLANT
CHLORAPREP W/TINT 26 (MISCELLANEOUS) ×2 IMPLANT
CLIP APPLIE ROT 10 11.4 M/L (STAPLE) IMPLANT
COVER SURGICAL LIGHT HANDLE (MISCELLANEOUS) ×2 IMPLANT
COVER WAND RF STERILE (DRAPES) ×2 IMPLANT
CUTTER FLEX LINEAR 45M (STAPLE) ×2 IMPLANT
DERMABOND ADVANCED (GAUZE/BANDAGES/DRESSINGS) ×1
DERMABOND ADVANCED .7 DNX12 (GAUZE/BANDAGES/DRESSINGS) ×1 IMPLANT
DRAPE WARM FLUID 44X44 (DRAPES) IMPLANT
ELECT REM PT RETURN 9FT ADLT (ELECTROSURGICAL) ×2
ELECTRODE REM PT RTRN 9FT ADLT (ELECTROSURGICAL) ×1 IMPLANT
ENDOLOOP SUT PDS II  0 18 (SUTURE)
ENDOLOOP SUT PDS II 0 18 (SUTURE) IMPLANT
GLOVE BIO SURGEON STRL SZ8 (GLOVE) ×2 IMPLANT
GLOVE BIOGEL PI IND STRL 8 (GLOVE) ×1 IMPLANT
GLOVE BIOGEL PI INDICATOR 8 (GLOVE) ×1
GOWN STRL REUS W/ TWL LRG LVL3 (GOWN DISPOSABLE) ×2 IMPLANT
GOWN STRL REUS W/ TWL XL LVL3 (GOWN DISPOSABLE) ×1 IMPLANT
GOWN STRL REUS W/TWL LRG LVL3 (GOWN DISPOSABLE) ×4
GOWN STRL REUS W/TWL XL LVL3 (GOWN DISPOSABLE) ×2
KIT BASIN OR (CUSTOM PROCEDURE TRAY) ×2 IMPLANT
KIT TURNOVER KIT B (KITS) ×2 IMPLANT
NS IRRIG 1000ML POUR BTL (IV SOLUTION) ×2 IMPLANT
PAD ARMBOARD 7.5X6 YLW CONV (MISCELLANEOUS) ×4 IMPLANT
POUCH RETRIEVAL ECOSAC 10 (ENDOMECHANICALS) ×1 IMPLANT
POUCH RETRIEVAL ECOSAC 10MM (ENDOMECHANICALS) ×1
RELOAD STAPLE TA45 3.5 REG BLU (ENDOMECHANICALS) ×6 IMPLANT
SCISSORS LAP 5X35 DISP (ENDOMECHANICALS) ×2 IMPLANT
SET IRRIG TUBING LAPAROSCOPIC (IRRIGATION / IRRIGATOR) ×2 IMPLANT
SET TUBE SMOKE EVAC HIGH FLOW (TUBING) ×2 IMPLANT
SHEARS HARMONIC ACE PLUS 36CM (ENDOMECHANICALS) ×2 IMPLANT
SPECIMEN JAR SMALL (MISCELLANEOUS) ×2 IMPLANT
SUT MON AB 4-0 PC3 18 (SUTURE) ×2 IMPLANT
SUT VICRYL 0 UR6 27IN ABS (SUTURE) ×2 IMPLANT
TOWEL GREEN STERILE (TOWEL DISPOSABLE) ×2 IMPLANT
TOWEL GREEN STERILE FF (TOWEL DISPOSABLE) ×2 IMPLANT
TRAY FOLEY W/BAG SLVR 16FR (SET/KITS/TRAYS/PACK) ×2
TRAY FOLEY W/BAG SLVR 16FR ST (SET/KITS/TRAYS/PACK) ×1 IMPLANT
TRAY LAPAROSCOPIC MC (CUSTOM PROCEDURE TRAY) ×2 IMPLANT
TROCAR XCEL BLADELESS 5X75MML (TROCAR) ×4 IMPLANT
TROCAR XCEL BLUNT TIP 100MML (ENDOMECHANICALS) ×2 IMPLANT
WATER STERILE IRR 1000ML POUR (IV SOLUTION) ×2 IMPLANT

## 2019-01-28 NOTE — Anesthesia Procedure Notes (Addendum)
Procedure Name: Intubation Performed by: Milford Cage, CRNA Pre-anesthesia Checklist: Patient identified, Emergency Drugs available, Suction available and Patient being monitored Patient Re-evaluated:Patient Re-evaluated prior to induction Oxygen Delivery Method: Circle System Utilized Preoxygenation: Pre-oxygenation with 100% oxygen Induction Type: IV induction Ventilation: Mask ventilation without difficulty, Oral airway inserted - appropriate to patient size and Two handed mask ventilation required Laryngoscope Size: Miller and 2 Grade View: Grade II Tube type: Oral Tube size: 7.5 mm Number of attempts: 2 Airway Equipment and Method: Stylet and Oral airway Placement Confirmation: ETT inserted through vocal cords under direct vision,  positive ETCO2 and breath sounds checked- equal and bilateral Secured at: 24 cm Tube secured with: Tape Dental Injury: Teeth and Oropharynx as per pre-operative assessment  Comments: First attempt by CRNA with Mac 4 blade, unable to view glottis. Second attempt by myself with Sabra Heck 2 blade, grade 2A view. Atraumatic intubation. Erasmo Downer, MD

## 2019-01-28 NOTE — Anesthesia Postprocedure Evaluation (Signed)
Anesthesia Post Note  Patient: Raymond Harrell  Procedure(s) Performed: LAPAROSCOPIC APPENDECTOMY (N/A Abdomen)     Patient location during evaluation: PACU Anesthesia Type: General Level of consciousness: awake and alert and oriented Pain management: pain level controlled Vital Signs Assessment: post-procedure vital signs reviewed and stable Respiratory status: spontaneous breathing, nonlabored ventilation and respiratory function stable Cardiovascular status: blood pressure returned to baseline Postop Assessment: no apparent nausea or vomiting Anesthetic complications: no    Last Vitals:  Vitals:   01/28/19 0601 01/28/19 0915  BP: (!) 151/97 (!) 152/97  Pulse: 79 96  Resp: 18 16  Temp: 36.8 C (!) 36.1 C  SpO2: 98% 95%    Last Pain:  Vitals:   01/28/19 1040  TempSrc:   PainSc: [place]

## 2019-01-28 NOTE — Interval H&P Note (Signed)
History and Physical Interval Note:  01/28/2019 7:34 AM  Raymond Harrell  has presented today for surgery, with the diagnosis of CHRONIC APPENDICITIS.  The various methods of treatment have been discussed with the patient and family. After consideration of risks, benefits and other options for treatment, the patient has consented to  Procedure(s): LAPAROSCOPIC APPENDECTOMY (N/A) as a surgical intervention.  The patient's history has been reviewed, patient examined, no change in status, stable for surgery.  I have reviewed the patient's chart and labs.  Questions were answered to the patient's satisfaction.     [place]

## 2019-01-28 NOTE — Transfer of Care (Signed)
Immediate Anesthesia Transfer of Care Note  Patient: Raymond Harrell  Procedure(s) Performed: LAPAROSCOPIC APPENDECTOMY (N/A Abdomen)  Patient Location: PACU  Anesthesia Type:General  Level of Consciousness: drowsy  Airway & Oxygen Therapy: Patient Spontanous Breathing and Patient connected to nasal cannula oxygen  Post-op Assessment: Report given to RN and Post -op Vital signs reviewed and stable  Post vital signs: Reviewed and stable  Last Vitals:  Vitals Value Taken Time  BP 152/97 01/28/19 0914  Temp    Pulse 96 01/28/19 0915  Resp 16 01/28/19 0915  SpO2 95 % 01/28/19 0915  Vitals shown include unvalidated device data.  Last Pain:  Vitals:   01/28/19 0659  TempSrc:   PainSc: 5       Patients Stated Pain Goal: 3 (88/11/03 1594)  Complications: No apparent anesthesia complications

## 2019-01-28 NOTE — Op Note (Signed)
Appendectomy, Lap, Procedure Note  Indications: Patient presents for laparoscopic appendectomy for chronic appendicitis.  He was admitted 6 weeks ago with COVID and appendicitis.  He was managed nonoperatively and his COVID is resolved.  He retested was negative.  He continues to have right lower quadrant pain which is intermittent.  He does have appendicolith therefore we recommended laparoscopic appendectomy to him.     Pre-operative Diagnosis: Chronic appendicitis  Post-operative Diagnosis: Same  Surgeon: Turner Daniels MD  Assistants: None  Anesthesia: General endotracheal anesthesia and Local anesthesia 0.25.% bupivacaine, with epinephrine  ASA Class: 2  Procedure Details  The patient was seen again in the Holding Room. The risks, benefits, complications, treatment options, and expected outcomes were discussed with the patient and/or family. The possibilities of reaction to medication, pulmonary aspiration, perforation of viscus, bleeding, recurrent infection, finding a normal appendix, the need for additional procedures, failure to diagnose a condition, and creating a complication requiring transfusion or operation were discussed. There was concurrence with the proposed plan and informed consent was obtained. The site of surgery was properly noted/marked. The patient was taken to Operating Room, identified as JAXSUN CIAMPI and the procedure verified as Appendectomy. A Time Out was held and the above information confirmed.  The patient was placed in the supine position and general anesthesia was induced, along with placement of orogastric tube, Venodyne boots, and a Foley catheter. The abdomen was prepped and draped in a sterile fashion. A one centimeter infraumbilical incision was made and the peritoneal cavity was accessed using the OPEN  technique. The pneumoperitoneum was then established to steady pressure of 12 mmHg. A 12 mm port was placed through the umbilical incision.  Additional 5 mm cannulas then placed in the left lower quadrant of the abdomen and right upper quadrant under direct vision. A careful evaluation of the entire abdomen was carried out. The patient was placed in Trendelenburg and left lateral decubitus position. The small intestines were retracted in the cephalad and left lateral direction away from the pelvis and right lower quadrant. The patient was found to have an enlarged and inflamed appendix that was extending into the pelvis. There was no evidence of perforation.  The appendix was carefully dissected. A window was made in the mesoappendix at the base of the appendix. A harmonic scalpel was used across the mesoappendix. The appendix was divided at its base using an endo-GIA stapler. Minimal appendiceal stump was left in place. There was no evidence of bleeding, leakage, or complication after division of the appendix. Irrigation was also performed and irrigate suctioned from the abdomen as well.  The umbilical port site was closed using 0 vicryl pursestring sutures fashion at the level of the fascia. The trocar site skin wounds were closed using skin staples.  Instrument, sponge, and needle counts were correct at the conclusion of the case.   Findings: The appendix was found to be inflamed. There were not signs of necrosis.  There was not perforation. There was not abscess formation.  Estimated Blood Loss:  Minimal         Drains: None         Total IV Fluids: Per anesthesia record         Specimens: Appendix         Complications:  None; patient tolerated the procedure well.         Disposition: PACU - hemodynamically stable.         Condition: stable

## 2019-01-29 ENCOUNTER — Encounter (HOSPITAL_COMMUNITY): Payer: Self-pay | Admitting: Surgery

## 2019-09-10 ENCOUNTER — Ambulatory Visit: Payer: Managed Care, Other (non HMO) | Attending: Internal Medicine

## 2019-09-10 DIAGNOSIS — Z23 Encounter for immunization: Secondary | ICD-10-CM

## 2019-09-10 NOTE — Progress Notes (Signed)
   Covid-19 Vaccination Clinic  Name:  WILBERTH DAMON    MRN: [id number] DOB: [birth year]  09/10/2019  Mr. Tom-Johnson was observed post Covid-19 immunization for 15 minutes without incident. He was provided with Vaccine Information Sheet and instruction to access the V-Safe system.   Mr. Rutigliano was instructed to call 911 with any severe reactions post vaccine: Marland Kitchen Difficulty breathing  . Swelling of face and throat  . A fast heartbeat  . A bad rash all over body  . Dizziness and weakness   Immunizations Administered    Name Date Dose VIS Date Route   Pfizer COVID-19 Vaccine 09/10/2019  2:13 PM 0.3 mL 05/20/2019 Intramuscular   Manufacturer: [employer], [employer]   Lot: [id number]   NDC: [address]-7

## 2019-10-04 ENCOUNTER — Ambulatory Visit: Payer: Managed Care, Other (non HMO) | Attending: Internal Medicine

## 2019-10-04 DIAGNOSIS — Z23 Encounter for immunization: Secondary | ICD-10-CM

## 2019-10-04 NOTE — Progress Notes (Signed)
   Covid-19 Vaccination Clinic  Name:  Raymond Harrell    MRN: [id number] DOB: 1965-10-13  10/04/2019  Mr. Raymond Harrell was observed post Covid-19 immunization for 15 minutes without incident. He was provided with Vaccine Information Sheet and instruction to access the V-Safe system.   Mr. Raymond Harrell was instructed to call 911 with any severe reactions post vaccine: Marland Kitchen Difficulty breathing  . Swelling of face and throat  . A fast heartbeat  . A bad rash all over body  . Dizziness and weakness   Immunizations Administered    Name Date Dose VIS Date Route   Pfizer COVID-19 Vaccine 10/04/2019  4:05 PM 0.3 mL 08/03/2018 Intramuscular   Manufacturer: [employer], [employer]   Lot: [id number]   NDC: [address]-1

## 2020-10-16 IMAGING — CT CT ABDOMEN AND PELVIS WITH CONTRAST
2 of 5 series · 16 of 46 positions shown, 18 images · IV contrast (APPLIED)
Comparison: None.

CLINICAL DATA: Shortness of breath, abdominal cramping and fever.
Coronavirus positive test today.

EXAM:
CT ABDOMEN AND PELVIS WITH CONTRAST
TECHNIQUE: Multidetector CT imaging of the abdomen and pelvis was performed
using the standard protocol following bolus administration of
intravenous contrast.
CONTRAST:  100mL OMNIPAQUE IOHEXOL 300 MG/ML  SOLN

[Series 3: abd/ pelvis 5.0 i30f 2 · axial · 0.96mm/px · z∈[+989,+1449]mm · 13 of 106 slices shown, 15 images]
[im 7/106  soft-tissue]
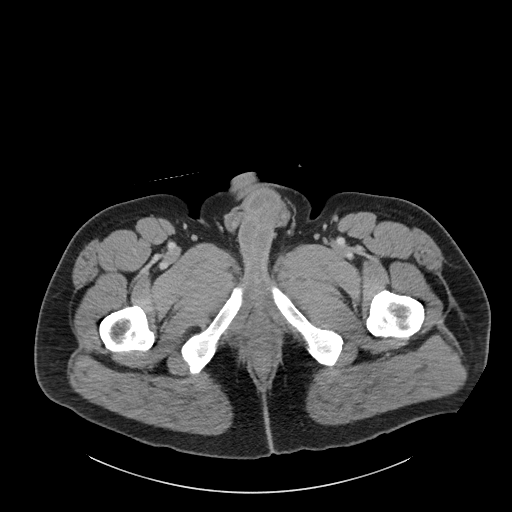
[im 7/106  bone]
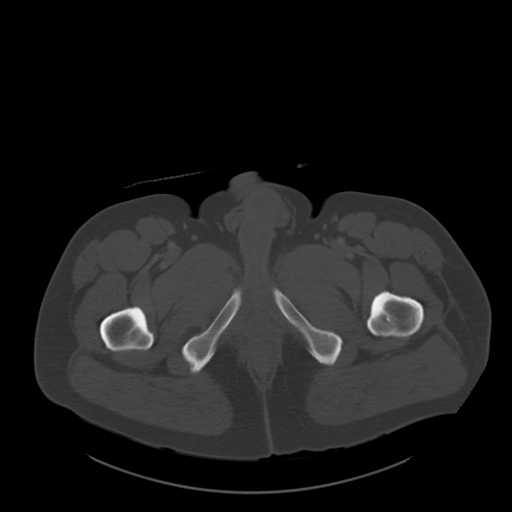
[im 13/106  soft-tissue]
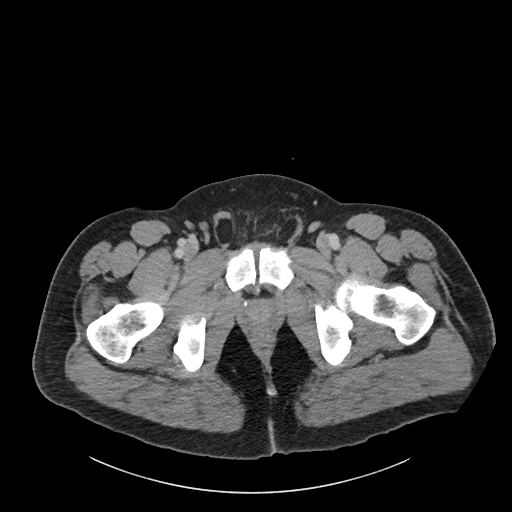
[im 25/106  soft-tissue]
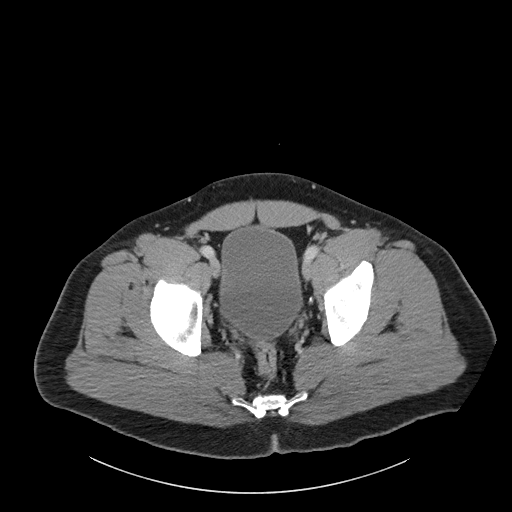
[im 31/106  soft-tissue]
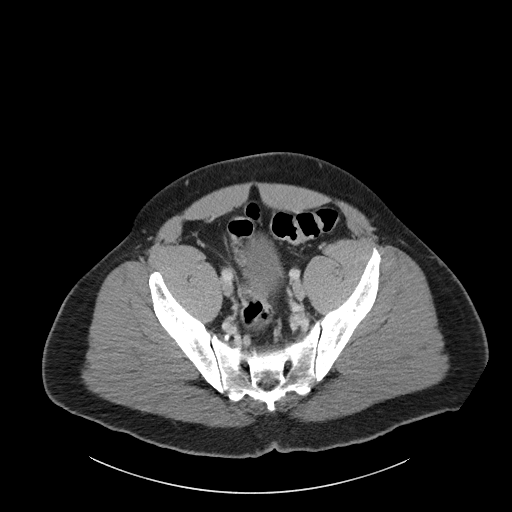
[im 38/106  soft-tissue]
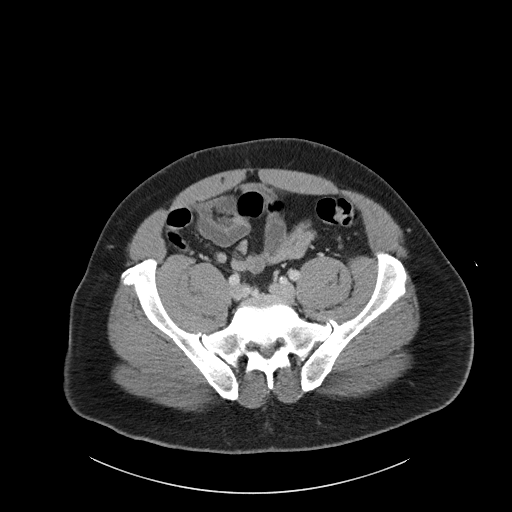
[im 44/106  soft-tissue]
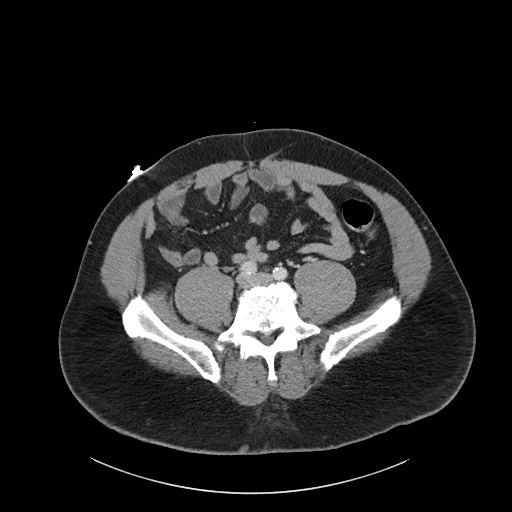
[im 56/106  soft-tissue]
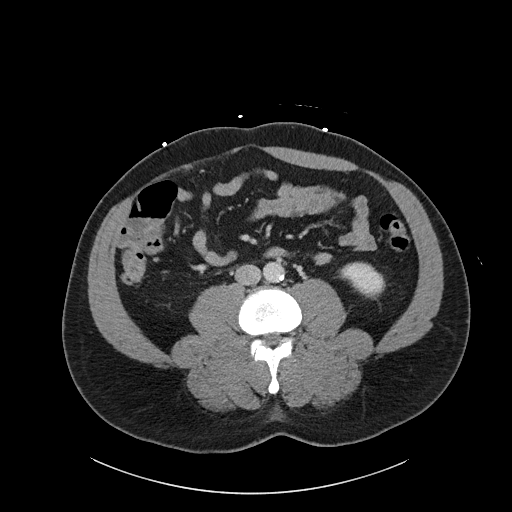
[im 62/106  soft-tissue]
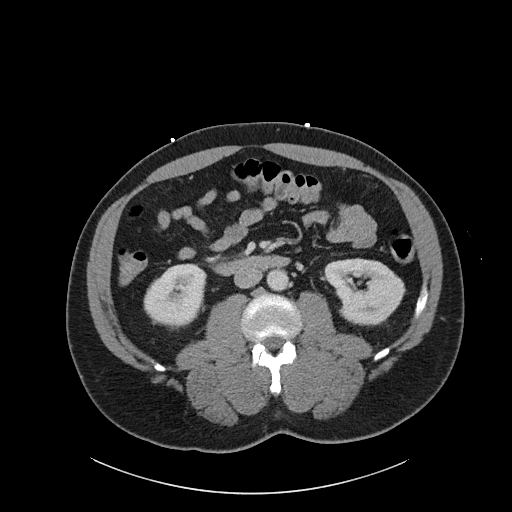
[im 68/106  soft-tissue]
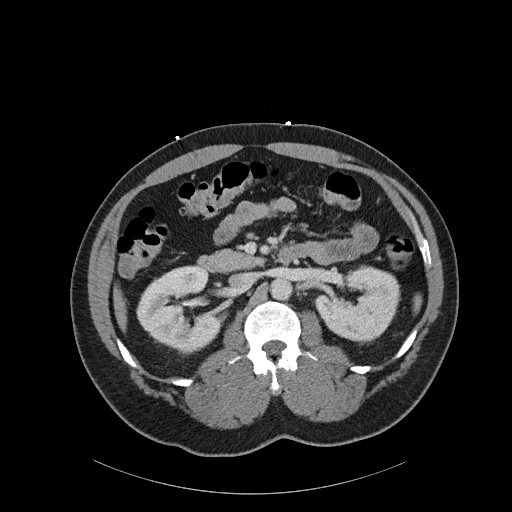
[im 68/106  bone]
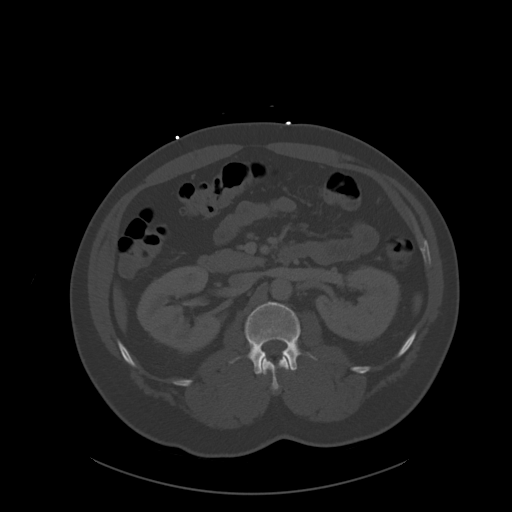
[im 75/106  soft-tissue]
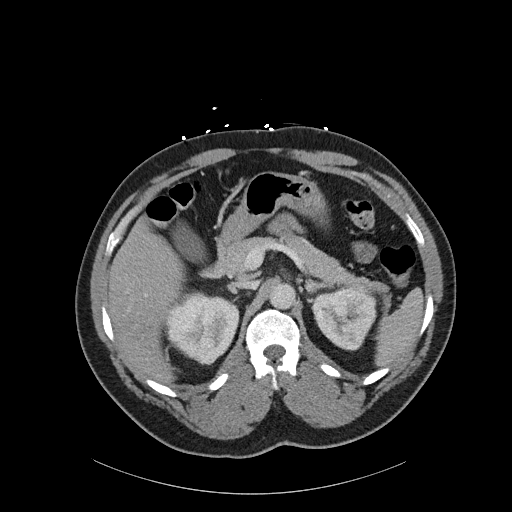
[im 81/106  soft-tissue]
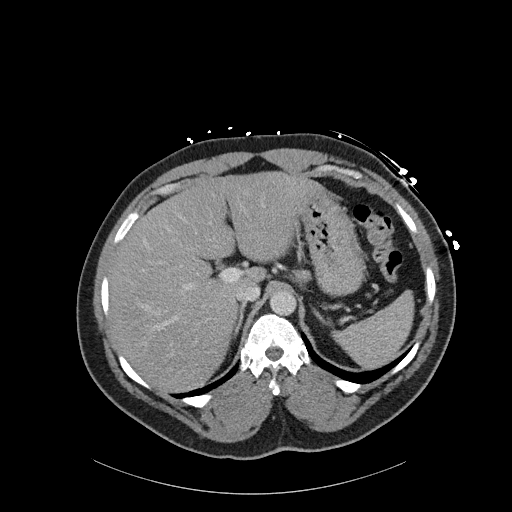
[im 93/106  soft-tissue]
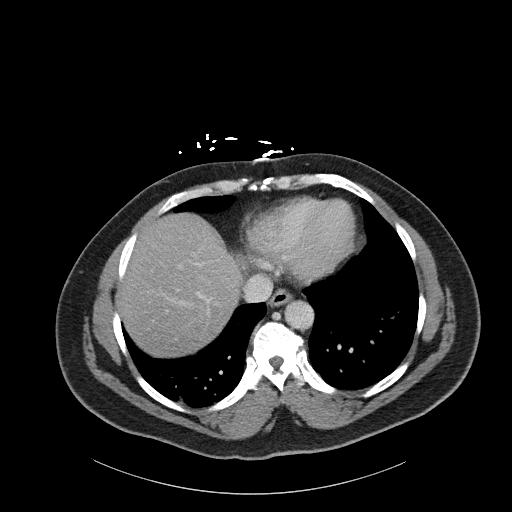
[im 99/106  soft-tissue]
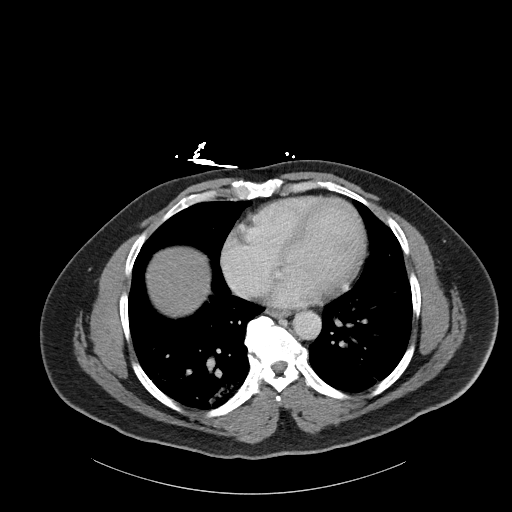

[Series 6: coronal soft tissue · coronal · 0.87mm/px · 3 of 101 slices shown]
[im 34/101  soft-tissue]
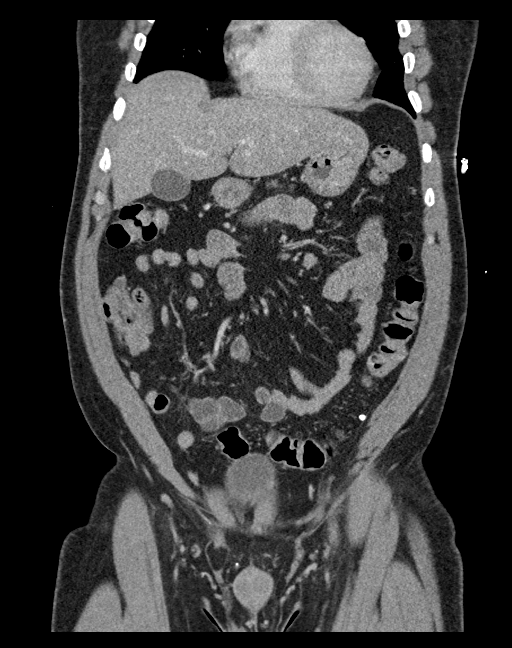
[im 45/101  soft-tissue]
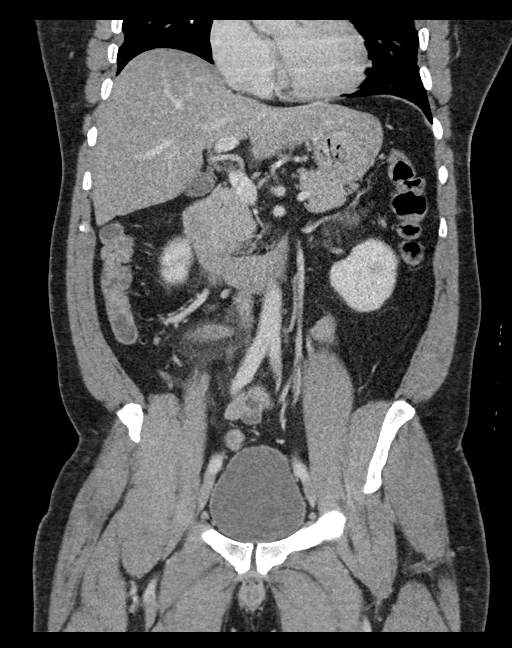
[im 56/101  soft-tissue]
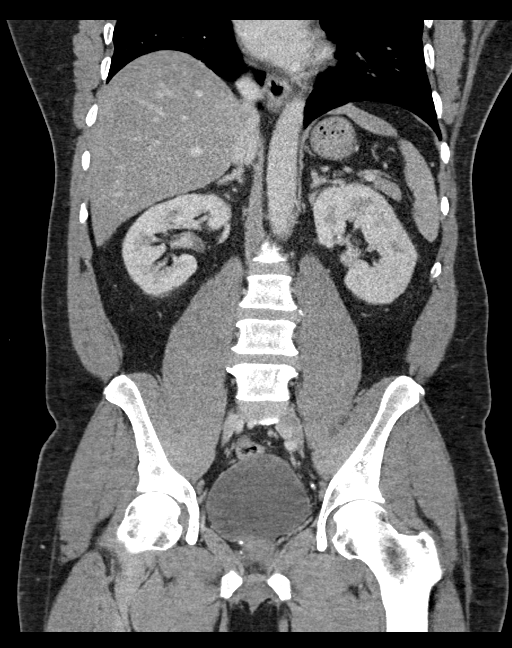

[16 of 46 positions shown; findings below may reference images not displayed]

FINDINGS: Lower chest: Extensive peripheral patchy ground-glass airspace
opacities are seen in both lungs, consistent with multifocal
atypical pneumonia.

Hepatobiliary: Hepatic steatosis. Normal appearance of the
gallbladder.

Pancreas: Unremarkable. No pancreatic ductal dilatation or
surrounding inflammatory changes.

Spleen: Normal in size without focal abnormality.

Adrenals/Urinary Tract: Adrenal glands are unremarkable. Kidneys are
normal, without renal calculi, focal lesion, or hydronephrosis.
Bladder is unremarkable.

Stomach/Bowel: Normal appearance of the stomach and small bowel. The
appendix is thickened inflamed with marked periappendiceal
inflammatory stranding. Small appendicoliths seen within the tip of
the appendix. No evidence of rupture or abscess formation. The colon
is normal.

Vascular/Lymphatic: Aortic atherosclerosis. No enlarged abdominal or
pelvic lymph nodes.

Reproductive: Prostate is unremarkable.

Other: No abdominal wall hernia or abnormality. No abdominopelvic
ascites.

Musculoskeletal: No acute or significant osseous findings.
IMPRESSION: 1. Acute appendicitis without evidence of rupture or abscess
formation.
2. Bilateral multifocal peripheral ground-glass airspace disease.
There are a spectrum of findings in the lungs which can be seen with
acute atypical infection (as well as other non-infectious
etiologies). In particular, viral pneumonia (including [id number])
should be considered in the appropriate clinical setting.
3. Hepatic steatosis.

These results were called by telephone at the time of interpretation
on 12/19/2018 at [DATE] to Dr. SYD LANGLOIS , who verbally
acknowledged these results.

## 2021-01-06 ENCOUNTER — Inpatient Hospital Stay (HOSPITAL_COMMUNITY)
Admission: EM | Admit: 2021-01-06 | Discharge: 2021-01-08 | DRG: 603 | Disposition: A | Discharge status: Home / Self Care | Payer: Managed Care, Other (non HMO) | Attending: Internal Medicine | Admitting: Internal Medicine

## 2021-01-06 ENCOUNTER — Other Ambulatory Visit: Payer: Self-pay

## 2021-01-06 ENCOUNTER — Encounter (HOSPITAL_COMMUNITY): Payer: Self-pay | Admitting: Emergency Medicine

## 2021-01-06 DIAGNOSIS — Z8616 Personal history of COVID-19: Secondary | ICD-10-CM

## 2021-01-06 DIAGNOSIS — E785 Hyperlipidemia, unspecified: Secondary | ICD-10-CM | POA: Diagnosis present

## 2021-01-06 DIAGNOSIS — I10 Essential (primary) hypertension: Secondary | ICD-10-CM | POA: Diagnosis present

## 2021-01-06 DIAGNOSIS — Z20822 Contact with and (suspected) exposure to covid-19: Secondary | ICD-10-CM | POA: Diagnosis present

## 2021-01-06 DIAGNOSIS — T63331A Toxic effect of venom of brown recluse spider, accidental (unintentional), initial encounter: Secondary | ICD-10-CM | POA: Diagnosis present

## 2021-01-06 DIAGNOSIS — S40261A Insect bite (nonvenomous) of right shoulder, initial encounter: Secondary | ICD-10-CM | POA: Diagnosis present

## 2021-01-06 DIAGNOSIS — L03113 Cellulitis of right upper limb: Secondary | ICD-10-CM | POA: Diagnosis not present

## 2021-01-06 DIAGNOSIS — Z87891 Personal history of nicotine dependence: Secondary | ICD-10-CM

## 2021-01-06 DIAGNOSIS — T63301A Toxic effect of unspecified spider venom, accidental (unintentional), initial encounter: Secondary | ICD-10-CM

## 2021-01-06 DIAGNOSIS — E11622 Type 2 diabetes mellitus with other skin ulcer: Secondary | ICD-10-CM | POA: Diagnosis present

## 2021-01-06 DIAGNOSIS — Z6831 Body mass index (BMI) 31.0-31.9, adult: Secondary | ICD-10-CM

## 2021-01-06 DIAGNOSIS — E1169 Type 2 diabetes mellitus with other specified complication: Secondary | ICD-10-CM | POA: Diagnosis present

## 2021-01-06 DIAGNOSIS — L98499 Non-pressure chronic ulcer of skin of other sites with unspecified severity: Secondary | ICD-10-CM | POA: Diagnosis present

## 2021-01-06 DIAGNOSIS — L03119 Cellulitis of unspecified part of limb: Secondary | ICD-10-CM | POA: Diagnosis not present

## 2021-01-06 DIAGNOSIS — Z79899 Other long term (current) drug therapy: Secondary | ICD-10-CM

## 2021-01-06 DIAGNOSIS — E669 Obesity, unspecified: Secondary | ICD-10-CM | POA: Diagnosis present

## 2021-01-06 DIAGNOSIS — Z8249 Family history of ischemic heart disease and other diseases of the circulatory system: Secondary | ICD-10-CM

## 2021-01-06 LAB — COMPREHENSIVE METABOLIC PANEL
ALT: 32 U/L (ref 0–44)
AST: 37 U/L (ref 15–41)
Albumin: 3.9 g/dL (ref 3.5–5.0)
Alkaline Phosphatase: 63 U/L (ref 38–126)
Anion gap: 10 (ref 5–15)
BUN: 16 mg/dL (ref 6–20)
CO2: 22 mmol/L (ref 22–32)
Calcium: 9 mg/dL (ref 8.9–10.3)
Chloride: 106 mmol/L (ref 98–111)
Creatinine, Ser: 1.2 mg/dL (ref 0.61–1.24)
GFR, Estimated: >60 mL/min (ref >60)
Glucose, Bld: 116 mg/dL — ABNORMAL HIGH (ref 70–99)
Potassium: 3.7 mmol/L (ref 3.5–5.1)
Sodium: 138 mmol/L (ref 135–145)
Total Bilirubin: 0.3 mg/dL (ref 0.3–1.2)
Total Protein: 7.5 g/dL (ref 6.5–8.1)

## 2021-01-06 LAB — CBC WITH DIFFERENTIAL/PLATELET
Abs Immature Granulocytes: 0.04 10*3/uL (ref 0.00–0.07)
Basophils Absolute: 0 10*3/uL (ref 0.0–0.1)
Basophils Relative: 0 %
Eosinophils Absolute: 0.1 10*3/uL (ref 0.0–0.5)
Eosinophils Relative: 1 %
HCT: 40.2 % (ref 39.0–52.0)
Hemoglobin: 12.5 g/dL — ABNORMAL LOW (ref 13.0–17.0)
Immature Granulocytes: 0 %
Lymphocytes Relative: 26 %
Lymphs Abs: 2.4 10*3/uL (ref 0.7–4.0)
MCH: 25.3 pg — ABNORMAL LOW (ref 26.0–34.0)
MCHC: 31.1 g/dL (ref 30.0–36.0)
MCV: 81.2 fL (ref 80.0–100.0)
Monocytes Absolute: 0.9 10*3/uL (ref 0.1–1.0)
Monocytes Relative: 10 %
Neutro Abs: 5.6 10*3/uL (ref 1.7–7.7)
Neutrophils Relative %: 63 %
Platelets: 208 10*3/uL (ref 150–400)
RBC: 4.95 MIL/uL (ref 4.22–5.81)
RDW: 15.1 % (ref 11.5–15.5)
WBC: 9.1 10*3/uL (ref 4.0–10.5)
nRBC: 0 % (ref 0.0–0.2)

## 2021-01-06 LAB — LACTIC ACID, PLASMA: Lactic Acid, Venous: 2 mmol/L (ref 0.5–1.9)

## 2021-01-06 LAB — CBG MONITORING, ED: Glucose-Capillary: 110 mg/dL — ABNORMAL HIGH (ref 70–99)

## 2021-01-06 NOTE — ED Notes (Signed)
Patient complains of feeling worse. Triage rn notified

## 2021-01-06 NOTE — ED Triage Notes (Signed)
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  He later felt feverish, nausea, dizziness, chills, and noticed a large burning sore to R shoulder.  States he believes he may have been bit by a spider.

## 2021-01-06 NOTE — ED Provider Notes (Addendum)
Emergency Medicine Provider Triage Evaluation Note  Raymond Harrell , a 55 y.o. male  was evaluated in triage.  Pt complains of dizziness, fever, right shoulder pain, right arm heaviness.  Patient states around 11:00 this morning he was in a dark space for work.  By 1:00, he started to feel poorly.  Between 1 and 4 he had dizziness, fever, increasing pain of his right shoulder.  He thinks he may have been bitten by something  Review of Systems  Positive: Fever, dizziness, R shoulder pain Negative: CP  Physical Exam  BP (!) 157/72 (BP Location: Right Arm)   Pulse 90   Temp 100 F (37.8 C) (Oral)   Resp 16   SpO2 99%  Gen:   Awake, no distress   Resp:  Normal effort  MSK:   Moves extremities without difficulty  Other:  Large area of ulceration/necrosis of the l shoulder with surrounding erythema. Good radial pulses bilaterally.      Medical Decision Making  Medically screening exam initiated at 7:07 PM.  Appropriate orders placed.  Ignatius Kloos Harrell was informed that the remainder of the evaluation will be completed by another provider, this initial triage assessment does not replace that evaluation, and the importance of remaining in the ED until their evaluation is complete.  Labs and ekg   Alveria Apley, PA-C 01/06/21 1911    Alveria Apley, PA-C 01/06/21 1911    Gilda Crease, MD 01/07/21 0002

## 2021-01-07 ENCOUNTER — Encounter (HOSPITAL_COMMUNITY): Payer: Self-pay | Admitting: Family Medicine

## 2021-01-07 DIAGNOSIS — L03119 Cellulitis of unspecified part of limb: Secondary | ICD-10-CM | POA: Diagnosis present

## 2021-01-07 DIAGNOSIS — E11622 Type 2 diabetes mellitus with other skin ulcer: Secondary | ICD-10-CM | POA: Diagnosis present

## 2021-01-07 DIAGNOSIS — Z8249 Family history of ischemic heart disease and other diseases of the circulatory system: Secondary | ICD-10-CM | POA: Diagnosis not present

## 2021-01-07 DIAGNOSIS — Z8616 Personal history of COVID-19: Secondary | ICD-10-CM | POA: Diagnosis not present

## 2021-01-07 DIAGNOSIS — E1169 Type 2 diabetes mellitus with other specified complication: Secondary | ICD-10-CM | POA: Diagnosis present

## 2021-01-07 DIAGNOSIS — E669 Obesity, unspecified: Secondary | ICD-10-CM | POA: Diagnosis present

## 2021-01-07 DIAGNOSIS — Z6831 Body mass index (BMI) 31.0-31.9, adult: Secondary | ICD-10-CM | POA: Diagnosis not present

## 2021-01-07 DIAGNOSIS — L03113 Cellulitis of right upper limb: Secondary | ICD-10-CM | POA: Diagnosis present

## 2021-01-07 DIAGNOSIS — S40261A Insect bite (nonvenomous) of right shoulder, initial encounter: Secondary | ICD-10-CM | POA: Diagnosis present

## 2021-01-07 DIAGNOSIS — L98499 Non-pressure chronic ulcer of skin of other sites with unspecified severity: Secondary | ICD-10-CM | POA: Diagnosis present

## 2021-01-07 DIAGNOSIS — E785 Hyperlipidemia, unspecified: Secondary | ICD-10-CM | POA: Diagnosis present

## 2021-01-07 DIAGNOSIS — I10 Essential (primary) hypertension: Secondary | ICD-10-CM | POA: Diagnosis present

## 2021-01-07 DIAGNOSIS — Z20822 Contact with and (suspected) exposure to covid-19: Secondary | ICD-10-CM | POA: Diagnosis present

## 2021-01-07 DIAGNOSIS — T63331A Toxic effect of venom of brown recluse spider, accidental (unintentional), initial encounter: Secondary | ICD-10-CM | POA: Diagnosis present

## 2021-01-07 DIAGNOSIS — Z79899 Other long term (current) drug therapy: Secondary | ICD-10-CM | POA: Diagnosis not present

## 2021-01-07 DIAGNOSIS — Z87891 Personal history of nicotine dependence: Secondary | ICD-10-CM | POA: Diagnosis not present

## 2021-01-07 LAB — RESP PANEL BY RT-PCR (FLU A&B, COVID) ARPGX2
Influenza A by PCR: NEGATIVE
Influenza B by PCR: NEGATIVE
SARS Coronavirus 2 by RT PCR: NEGATIVE

## 2021-01-07 LAB — CBC
HCT: 39.1 % (ref 39.0–52.0)
Hemoglobin: 12.3 g/dL — ABNORMAL LOW (ref 13.0–17.0)
MCH: 25.6 pg — ABNORMAL LOW (ref 26.0–34.0)
MCHC: 31.5 g/dL (ref 30.0–36.0)
MCV: 81.3 fL (ref 80.0–100.0)
Platelets: 188 10*3/uL (ref 150–400)
RBC: 4.81 MIL/uL (ref 4.22–5.81)
RDW: 15 % (ref 11.5–15.5)
WBC: 9.2 10*3/uL (ref 4.0–10.5)
nRBC: 0 % (ref 0.0–0.2)

## 2021-01-07 LAB — BASIC METABOLIC PANEL
Anion gap: 10 (ref 5–15)
BUN: 15 mg/dL (ref 6–20)
CO2: 21 mmol/L — ABNORMAL LOW (ref 22–32)
Calcium: 8.5 mg/dL — ABNORMAL LOW (ref 8.9–10.3)
Chloride: 105 mmol/L (ref 98–111)
Creatinine, Ser: 0.89 mg/dL (ref 0.61–1.24)
GFR, Estimated: >60 mL/min (ref >60)
Glucose, Bld: 105 mg/dL — ABNORMAL HIGH (ref 70–99)
Potassium: 3.8 mmol/L (ref 3.5–5.1)
Sodium: 136 mmol/L (ref 135–145)

## 2021-01-07 LAB — GLUCOSE, CAPILLARY
Glucose-Capillary: 115 mg/dL — ABNORMAL HIGH (ref 70–99)
Glucose-Capillary: 99 mg/dL (ref 70–99)
Glucose-Capillary: 124 mg/dL — ABNORMAL HIGH (ref 70–99)
Glucose-Capillary: 111 mg/dL — ABNORMAL HIGH (ref 70–99)

## 2021-01-07 LAB — LACTIC ACID, PLASMA: Lactic Acid, Venous: 1.4 mmol/L (ref 0.5–1.9)

## 2021-01-07 LAB — HEMOGLOBIN A1C
Hgb A1c MFr Bld: 6.8 % — ABNORMAL HIGH (ref 4.8–5.6)
Mean Plasma Glucose: 148.46 mg/dL

## 2021-01-07 LAB — HIV ANTIBODY (ROUTINE TESTING W REFLEX): HIV Screen 4th Generation wRfx: NONREACTIVE

## 2021-01-07 MED ORDER — SODIUM CHLORIDE 0.9 % IV BOLUS
1000.0000 mL | Freq: Once | INTRAVENOUS | Status: AC
  Administered 2021-01-07: 1000 mL via INTRAVENOUS

## 2021-01-07 MED ORDER — ACETAMINOPHEN 500 MG PO TABS
1000.0000 mg | ORAL_TABLET | Freq: Once | ORAL | Status: AC
  Administered 2021-01-07: 1000 mg via ORAL
  Filled 2021-01-07: qty 2

## 2021-01-07 MED ORDER — LACTATED RINGERS IV SOLN: INTRAVENOUS | Status: DC

## 2021-01-07 MED ORDER — SODIUM CHLORIDE 0.9 % IV SOLN
2.0000 g | Freq: Once | INTRAVENOUS | Status: AC
  Administered 2021-01-07: 2 g via INTRAVENOUS
  Filled 2021-01-07: qty 20

## 2021-01-07 MED ORDER — INSULIN ASPART 100 UNIT/ML IJ SOLN: 0.0000 [IU] | Freq: Every day | INTRAMUSCULAR | Status: DC

## 2021-01-07 MED ORDER — SODIUM CHLORIDE 0.9 % IV SOLN: 2.0000 g | INTRAVENOUS | Status: DC

## 2021-01-07 MED ORDER — INSULIN ASPART 100 UNIT/ML IJ SOLN
0.0000 [IU] | Freq: Three times a day (TID) | INTRAMUSCULAR | Status: DC
  Administered 2021-01-07 – 2021-01-08 (×2): 1 [IU] via SUBCUTANEOUS

## 2021-01-07 MED ORDER — ACETAMINOPHEN 325 MG PO TABS
650.0000 mg | ORAL_TABLET | Freq: Four times a day (QID) | ORAL | Status: DC | PRN
  Administered 2021-01-07: 650 mg via ORAL
  Filled 2021-01-07: qty 2

## 2021-01-07 MED ORDER — CEFDINIR 300 MG PO CAPS: 300.0000 mg | ORAL_CAPSULE | Freq: Two times a day (BID) | ORAL | 0 refills | Status: DC

## 2021-01-07 MED ORDER — LISINOPRIL 20 MG PO TABS
20.0000 mg | ORAL_TABLET | Freq: Every day | ORAL | Status: DC
  Administered 2021-01-07 – 2021-01-08 (×2): 20 mg via ORAL
  Filled 2021-01-07 (×2): qty 1

## 2021-01-07 MED ORDER — ONDANSETRON HCL 4 MG/2ML IJ SOLN: 4.0000 mg | Freq: Four times a day (QID) | INTRAMUSCULAR | Status: DC | PRN

## 2021-01-07 MED ORDER — ROSUVASTATIN CALCIUM 20 MG PO TABS
20.0000 mg | ORAL_TABLET | Freq: Every day | ORAL | Status: DC
  Administered 2021-01-07 – 2021-01-08 (×2): 20 mg via ORAL
  Filled 2021-01-07 (×2): qty 1

## 2021-01-07 MED ORDER — ASPIRIN EC 81 MG PO TBEC
81.0000 mg | DELAYED_RELEASE_TABLET | Freq: Every day | ORAL | Status: DC
  Administered 2021-01-07 – 2021-01-08 (×2): 81 mg via ORAL
  Filled 2021-01-07 (×2): qty 1

## 2021-01-07 MED ORDER — ACETAMINOPHEN 650 MG RE SUPP: 650.0000 mg | Freq: Four times a day (QID) | RECTAL | Status: DC | PRN

## 2021-01-07 MED ORDER — ONDANSETRON HCL 4 MG PO TABS: 4.0000 mg | ORAL_TABLET | Freq: Four times a day (QID) | ORAL | Status: DC | PRN

## 2021-01-07 MED ORDER — PIPERACILLIN-TAZOBACTAM 3.375 G IVPB
3.3750 g | Freq: Three times a day (TID) | INTRAVENOUS | Status: DC
  Administered 2021-01-07 – 2021-01-08 (×4): 3.375 g via INTRAVENOUS
  Filled 2021-01-07 (×6): qty 50

## 2021-01-07 NOTE — Progress Notes (Signed)
[facility] PLAN OF CARE NOTE Patient: Raymond Harrell SVX:[id number]   PCP: Patient, No Pcp Per (Inactive) DOB: 11/29/1965   DOA: 01/06/2021   DOS: 01/07/2021    Patient was admitted by my colleague earlier on 01/07/2021. I have reviewed the H&P as well as assessment and plan and agree with the same. Important changes in the plan are listed below.  Plan of care: Principal Problem:   Cellulitis of shoulder Active Problems:   Essential hypertension   Type 2 diabetes mellitus with hyperlipidemia (HCC) Discussed with orthopedics.  Recommend to treat as cellulitis.  No indication for debridement given concern for brown recluse spider bite.  Monitor.  Author: Lynden Oxford, MD [facility] 01/07/2021 8:13 PM   If 7PM-7AM, please contact night-coverage at [email or web address]

## 2021-01-07 NOTE — ED Notes (Signed)
Confirmed with lab that pt's second lactic acid of 1.4 was collected at 0010 on 01/07/2021.

## 2021-01-07 NOTE — Discharge Instructions (Addendum)
Return to the emergency department for repeat evaluation if any of your symptoms worsen, especially if the bite area worsens.

## 2021-01-07 NOTE — ED Provider Notes (Addendum)
[facility] EMERGENCY DEPARTMENT Provider Note   CSN: [id number] Arrival date & time: 01/06/21  1828     History Chief Complaint  Patient presents with   Insect Bite    Raymond Harrell is a 54 y.o. male.  Patient reports that he had to work in a enclosed dark space earlier today at work.  This was around 11 AM.  Around 1 PM he started to feel bad.  He reports that he felt dizzy and weak and then started to have chills.  He then started to notice pain in his right shoulder and when he looked at the area there was a lesion there.  He thinks he might have been bitten by a spider or insect.      Past Medical History:  Diagnosis Date   Abnormal CT scan    a. CT angio incidentally noted nodular opacity in upper abdomen felt lymph node vs part of pancreas, felt to be benign lesion with stable appearance.   Chest pain    a. Negative nuc 2010 (abnormal baseline EKG). b. Adm with pleuritic CP 02/2012: negative cardiac enzymes, CTA negative for PE  but did show bronchiectasis/scarring RUL.   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   Bronchiectasis (HCC) 02/19/2012    Past Surgical History:  Procedure Laterality Date   HAND TENDON SURGERY Right    LAPAROSCOPIC APPENDECTOMY N/A 01/28/2019   Procedure: LAPAROSCOPIC APPENDECTOMY;  Surgeon: Harriette Bouillon, MD;  Location: [facility] OR;  Service: General;  Laterality: N/A;       Family  History  Problem Relation Age of Onset   Heart attack Father        MI in his 47's    Social History   Tobacco Use   Smoking status: Former    Years: 24.00    Types: Cigarettes    Quit date: 2005    Years since quitting: 17.5   Smokeless tobacco: Never  Vaping Use   Vaping Use: Never used  Substance Use Topics   Alcohol use: Yes    Alcohol/week: 27.0 standard drinks    Types: 27 Cans of beer per week   Drug use: No    Home Medications Prior to Admission medications   Medication Sig Start Date End Date Taking? Authorizing Provider  cefdinir (OMNICEF) 300 MG capsule Take 1 capsule (300 mg total) by mouth 2 (two) times daily. 01/07/21  Yes Paighton Godette, Canary Brim, MD  acetaminophen (TYLENOL) 500 MG tablet Take 1,000 mg by mouth every 6 (six) hours as needed (for pain.).    [provider]  aspirin 81 MG EC tablet Take 81 mg by mouth daily.     [provider]  Garlic 1000 MG CAPS Take 1,000 mg by mouth daily.    [provider]  ibuprofen (ADVIL) 800 MG tablet Take 1 tablet (800 mg total) by mouth every 8 (eight) hours as needed.  01/28/19   Cornett, Maisie Fus, MD  lisinopril (PRINIVIL,ZESTRIL) 20 MG tablet Take 1 tablet (20 mg total) by mouth daily. 12/09/17   Mancel Bale, MD  Multiple Vitamin (MULTIVITAMIN WITH MINERALS) TABS tablet Take 1 tablet by mouth daily.    [provider]  Omega-3 Fatty Acids (FISH OIL) 500 MG CAPS Take 500 mg by mouth daily.    [provider]  oxyCODONE (OXY IR/ROXICODONE) 5 MG immediate release tablet Take 1 tablet (5 mg total) by mouth every 6 (six) hours as needed for severe pain. 01/28/19   Cornett, Maisie Fus, MD  rosuvastatin (CRESTOR) 20 MG tablet Take 20 mg by mouth daily. 09/09/18   [provider]  vitamin C (ASCORBIC ACID) 500 MG tablet Take 500 mg by mouth daily.    [provider]    Allergies    Patient has no known allergies.  Review of Systems   Review of Systems  Constitutional:   Positive for chills and fever.  Musculoskeletal:  Positive for myalgias.  Skin:  Positive for wound.  All other systems reviewed and are negative.  Physical Exam Updated Vital Signs BP (!) 148/74   Pulse 79   Temp 100.3 F (37.9 C) (Oral)   Resp 18   SpO2 99%   Physical Exam Vitals and nursing note reviewed.  Constitutional:      General: He is not in acute distress.    Appearance: Normal appearance. He is well-developed.  HENT:     Head: Normocephalic and atraumatic.     Right Ear: Hearing normal.     Left Ear: Hearing normal.     Nose: Nose normal.  Eyes:     Conjunctiva/sclera: Conjunctivae normal.     Pupils: Pupils are equal, round, and reactive to light.  Cardiovascular:     Rate and Rhythm: Regular rhythm.     Heart sounds: S1 normal and S2 normal. No murmur heard.   No friction rub. No gallop.  Pulmonary:     Effort: Pulmonary effort is normal. No respiratory distress.     Breath sounds: Normal breath sounds.  Chest:     Chest wall: No tenderness.  Abdominal:     General: Bowel sounds are normal.     Palpations: Abdomen is soft.     Tenderness: There is no abdominal tenderness. There is no guarding or rebound. Negative signs include Murphy's sign and McBurney's sign.     Hernia: No hernia is present.  Musculoskeletal:        General: Normal range of motion.     Cervical back: Normal range of motion and neck supple.  Skin:    General: Skin is warm and dry.     Findings: Wound (Right posterior shoulder) present. No rash.     Comments: 4 cm eschar with surrounding blistering at the margins and then surrounding slightly raised erythema  Neurological:     Mental Status: He is alert and oriented to person, place, and time.     GCS: GCS eye subscore is 4. GCS verbal subscore is 5. GCS motor subscore is 6.     Cranial Nerves: No cranial nerve deficit.     Sensory: No sensory deficit.     Coordination: Coordination normal.  Psychiatric:        Speech: Speech  normal.        Behavior: Behavior normal.        Thought Content: Thought content normal.    ED Results / Procedures / Treatments  Labs (all labs ordered are listed, but only abnormal results are displayed) Labs Reviewed  CBC WITH DIFFERENTIAL/PLATELET - Abnormal; Notable for the following components:      Result Value   Hemoglobin 12.5 (*)    MCH 25.3 (*)    All other components within normal limits  COMPREHENSIVE METABOLIC PANEL - Abnormal; Notable for the following components:   Glucose, Bld 116 (*)    All other components within normal limits  LACTIC ACID, PLASMA - Abnormal; Notable for the following components:   Lactic Acid, Venous 2.0 (*)    All other components within normal limits  CBG MONITORING, ED - Abnormal; Notable for the following components:   Glucose-Capillary 110 (*)    All other components within normal limits  RESP PANEL BY RT-PCR (FLU A&B, COVID) [id number]  LACTIC ACID, PLASMA    EKG EKG Interpretation  Date/Time:  Monday January 07 2021 00:27:30 EDT Ventricular Rate:  81 PR Interval:  178 QRS Duration: 84 QT Interval:  374 QTC Calculation: 434 R Axis:   55 Text Interpretation: Normal sinus rhythm Nonspecific T wave abnormality Abnormal ECG Confirmed by Gilda CreasePollina, Malikhi Ogan J [phone]([id number]) on 01/07/2021 12:35:47 AM  Radiology No results found.  Procedures Procedures   Medications Ordered in ED Medications  cefTRIAXone (ROCEPHIN) 2 g in sodium chloride 0.9 % 100 mL IVPB (2 g Intravenous New Bag/Given 01/07/21 0241)  sodium chloride 0.9 % bolus 1,000 mL (0 mLs Intravenous Stopped 01/07/21 0241)  acetaminophen (TYLENOL) tablet 1,000 mg (1,000 mg Oral Given 01/07/21 0022)    ED Course  I have reviewed the triage vital signs and the nursing notes.  Pertinent labs & imaging results that were available during my care of the patient were reviewed by me and considered in my medical decision making (see chart for details).    MDM Rules/Calculators/A&P                            Patient presents to the emergency department for evaluation of possible spider or insect bite.  Patient thinks he was bitten at work earlier today.  Around midday he started to have fever, chills, weakness, dizziness.  This afternoon he noticed a lesion on his left shoulder that is painful.  Lesion does have a central blackened eschar region with surrounding blistering and erythema.  No fluctuance.  Suspect that this is some form of an envenomation with local reaction.  Less likely would be infection.  At this point lesion is very superficial, nothing to debride.  No sign of abscess.  Patient with normal white blood cell count but borderline lactic acid which has cleared with IV fluids (note: First lactic acid was 2.0, second was 1.4.  There was a clerical error in lab collection and the second sample was inadvertently labeled July 31 at 00:10 and the results, it was actually drawn August 1 at 00:10)  Patient was given Tylenol, has defervesced.  Heart rate is now in the 70s.  No tachypnea.  COVID-negative.  During the time spent in the department, wound has worsened.  What was initially a simple dry eschar is now weeping and necrotic.  Will empirically cover with antibiotics, first dose of Rocephin in the department.  .  Final Clinical Impression(s) / ED Diagnoses Final diagnoses:  Spider bite wound, accidental or unintentional, initial encounter    Rx / DC Orders     Gilda CreasePollina, Jantzen Pilger J, MD 01/07/21 0258    Jaci CarrelPollina, Adisyn Ruscitti  J, MD 01/07/21 0400

## 2021-01-07 NOTE — H&P (Signed)
History and Physical    Ignacio Lowder Harrell ZOX:[id number] DOB: [birth year] DOA: 01/06/2021  PCP: Patient, No Pcp Per (Inactive)   Patient coming from: Home  Chief Complaint:  "feeling bad after bite to shoulder"  HPI: Raymond Harrell is a 55 y.o. male with medical history significant for DMT2, HTN, HLD who presents for evaluation of not feeling well after being bit by something yesterday afternoon.  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  He states he felt a small sting on his right shoulder but did not think anything of it until later when he started to feel bad.  Around 1:00 he reports he felt bad that he described as feeling weak and dizzy and having chills and a fever.  He went home and decided just to rest but his right shoulder started to hurt worse in the area where he thinks he was bitten.  He is not sure if it was a spider or some other insect as he did not see it.  He states that it felt like a sharp sting but over the next few hours it became a throbbing dull pain.  He developed a black area on his posterior shoulder where he was stung with surrounding erythema that was warm to touch.  He reports his shoulder had increased pain with movement of his shoulder. He has diabetes but does not remember the names of his medications. He states his blood sugar has been controlled.   ED Course: In the emergency room patient has been hemodynamically stable.  He was given Tylenol and fever has broke.  He was given antibiotic with Rocephin.  He has had a black eschar that is about 4 cm in diameter when he arrived and over the next few hours it developed small blisters and weeping with some sloughing of the skin at 1 edge.  With the progression of the wound as well service asked to admit for further management.  CBC was unremarkable.  Electrolytes and renal function were normal.  COVID-19 swab was negative.  Review  of Systems:  General: Reports fever, chills. Denies weight loss, night sweats. Denies dizziness. Denies change in appetite HENT: Denies head trauma, headache, denies change in hearing, tinnitus.  Denies nasal congestion. Denies sore throat, sores in mouth.  Denies difficulty swallowing Eyes: Denies blurry vision, pain in eye, drainage.  Denies discoloration of eyes. Neck: Denies pain.  Denies swelling.  Denies pain with movement. Cardiovascular: Denies chest pain, palpitations.  Denies edema.  Denies orthopnea Respiratory: Denies shortness of breath, cough.  Denies wheezing.  Denies sputum production Gastrointestinal: Denies abdominal pain, swelling.  Denies nausea, vomiting, diarrhea.  Denies melena.  Denies hematemesis. Musculoskeletal: Denies limitation of movement.  Denies deformity or swelling.  Denies pain.  Denies arthralgias or myalgias. Genitourinary: Denies pelvic pain.  Denies urinary frequency or hesitancy.  Denies dysuria.  Skin: Denies rash.  Denies petechiae, purpura, ecchymosis. Neurological: Denies syncope. Denies seizure activity. Denies weakness or paresthesia. Denies slurred speech, drooping face.  Denies visual change. Psychiatric: Denies depression, anxiety. Denies hallucinations.  Past Medical History:  Diagnosis Date   Abnormal CT scan    a. CT angio incidentally noted nodular opacity in upper abdomen felt lymph node vs part of pancreas, felt to be benign lesion with stable appearance.   Chest pain    a. Negative nuc 2010 (abnormal baseline EKG). b. Adm with pleuritic  CP 02/2012: negative cardiac enzymes, CTA negative for PE  but did show bronchiectasis/scarring RUL.   Diabetes mellitus (HCC)    Diagnosed 02/2012-- 8/20/2020not on medication any longer-   Elevated blood pressure    Noted 02/2012   Elevated LDL cholesterol level    Noted 02/2012   History of kidney stones    Passed   Hypertension     Past Surgical History:  Procedure Laterality Date   HAND TENDON  SURGERY Right    LAPAROSCOPIC APPENDECTOMY N/A 01/28/2019   Procedure: LAPAROSCOPIC APPENDECTOMY;  Surgeon: Harriette Bouillon, MD;  Location: [facility] OR;  Service: General;  Laterality: N/A;    Social History  reports that he quit smoking about 17 years ago. He has never used smokeless tobacco. He reports current alcohol use of about 27.0 standard drinks of alcohol per week. He reports that he does not use drugs.  No Known Allergies  Family History  Problem Relation Age of Onset   Heart attack Father        MI in his 44's     Prior to Admission medications   Medication Sig Start Date End Date Taking? Authorizing Provider  cefdinir (OMNICEF) 300 MG capsule Take 1 capsule (300 mg total) by mouth 2 (two) times daily. 01/07/21  Yes Pollina, Canary Brim, MD  acetaminophen (TYLENOL) 500 MG tablet Take 1,000 mg by mouth every 6 (six) hours as needed (for pain.).    [provider]  aspirin 81 MG EC tablet Take 81 mg by mouth daily.     [provider]  Garlic 1000 MG CAPS Take 1,000 mg by mouth daily.    [provider]  ibuprofen (ADVIL) 800 MG tablet Take 1 tablet (800 mg total) by mouth every 8 (eight) hours as needed. 01/28/19   Cornett, Maisie Fus, MD  lisinopril (PRINIVIL,ZESTRIL) 20 MG tablet Take 1 tablet (20 mg total) by mouth daily. 12/09/17   Mancel Bale, MD  Multiple Vitamin (MULTIVITAMIN WITH MINERALS) TABS tablet Take 1 tablet by mouth daily.    [provider]  Omega-3 Fatty Acids (FISH OIL) 500 MG CAPS Take 500 mg by mouth daily.    [provider]  oxyCODONE (OXY IR/ROXICODONE) 5 MG immediate release tablet Take 1 tablet (5 mg total) by mouth every 6 (six) hours as needed for severe pain. 01/28/19   Cornett, Maisie Fus, MD  rosuvastatin (CRESTOR) 20 MG tablet Take 20 mg by mouth daily. 09/09/18   [provider]  vitamin C (ASCORBIC ACID) 500 MG tablet Take 500 mg by mouth daily.    [provider]    Physical Exam: Vitals:    01/06/21 1859 01/06/21 2114 01/07/21 0047 01/07/21 0328  BP: (!) 157/72 138/82 (!) 148/74 140/76  Pulse: 90 82 79 89  Resp: 16 (!) 22 18 14   Temp: 100 F (37.8 C) 100.3 F (37.9 C)  98.4 F (36.9 C)  TempSrc: Oral Oral  Oral  SpO2: 99% 100% 99% 98%    Constitutional: NAD, calm, comfortable Vitals:   01/06/21 1859 01/06/21 2114 01/07/21 0047 01/07/21 0328  BP: (!) 157/72 138/82 (!) 148/74 140/76  Pulse: 90 82 79 89  Resp: 16 (!) 22 18 14   Temp: 100 F (37.8 C) 100.3 F (37.9 C)  98.4 F (36.9 C)  TempSrc: Oral Oral  Oral  SpO2: 99% 100% 99% 98%   General: WDWN, Alert and oriented x3.  Eyes: EOMI, PERRL, conjunctivae normal.  Sclera nonicteric HENT:  [place]/AT, external ears normal.  Nares  patent without epistasis.  Mucous membranes are moist.  Neck: Soft, normal range of motion, supple, no masses, no thyromegaly.  Trachea midline Respiratory: clear to auscultation bilaterally, no wheezing, no crackles. Normal respiratory effort. No accessory muscle use.  Cardiovascular: Regular rate and rhythm, no murmurs / rubs / gallops. No extremity edema.  Abdomen: Soft, no tenderness, nondistended, no rebound or guarding. Bowel sounds normoactive Musculoskeletal: FROM. no cyanosis. No joint deformity upper and lower extremities. Normal muscle tone.  Skin: Warm, dry, intact no rashes.  No petechiae or purpura.  4 cm eschar on posterior right shoulder with weeping and small blisters at the edges with some sloughing of the skin under the eschar Neurologic: CN 2-12 grossly intact.  Normal speech.  Sensation intact to touch. Strength 5/5 in all extremities.   Psychiatric: Normal judgment and insight. Normal mood.    Labs on Admission: I have personally reviewed following labs and imaging studies  CBC: Recent Labs  Lab 01/06/21 1908  WBC 9.1  NEUTROABS 5.6  HGB 12.5*  HCT 40.2  MCV 81.2  PLT 208    Basic Metabolic Panel: Recent Labs  Lab 01/06/21 1908  NA 138  K 3.7  CL 106  CO2  22  GLUCOSE 116*  BUN 16  CREATININE 1.20  CALCIUM 9.0    GFR: CrCl cannot be calculated (Unknown ideal weight.).  Liver Function Tests: Recent Labs  Lab 01/06/21 1908  AST 37  ALT 32  ALKPHOS 63  BILITOT 0.3  PROT 7.5  ALBUMIN 3.9    Urine analysis:    Component Value Date/Time   COLORURINE YELLOW 12/19/2018 1826   APPEARANCEUR CLEAR 12/19/2018 1826   LABSPEC 1.035 (H) 12/19/2018 1826   PHURINE 6.0 12/19/2018 1826   GLUCOSEU NEGATIVE 12/19/2018 1826   HGBUR NEGATIVE 12/19/2018 1826   BILIRUBINUR NEGATIVE 12/19/2018 1826   BILIRUBINUR neg 02/24/2012 0907   KETONESUR NEGATIVE 12/19/2018 1826   PROTEINUR 30 (A) 12/19/2018 1826   UROBILINOGEN 0.2 02/24/2012 0907   UROBILINOGEN 0.2 02/18/2012 1102   NITRITE NEGATIVE 12/19/2018 1826   LEUKOCYTESUR NEGATIVE 12/19/2018 1826    Radiological Exams on Admission: No results found.  EKG: Independently reviewed.  EKG shows normal sinus rhythm with nonspecific ST changes but no acute ST elevation or depression.  QTc 434  Assessment/Plan Principal Problem:   Cellulitis of shoulder Mr. Harrell is admitted to Med/Surg floor.  Appears to have an envenomation on right posterior shoulder that has developed eschar and small blister with yellow drainage. Has surrounding erythema and is now weeping from edges.  Started on Rocephin for antibiotic coverage.  Had a fever when arrived but was given tylenol and defervesced.   Active Problems:   Essential hypertension Continue Lisinopril. Monitor BP.     Type 2 diabetes mellitus with hyperlipidemia  Pt is unsure of his home meds so will have to have pharmacy obtain and verify medications.  In mean time will use corrective insulin as needed for glycemic control. Will check blood sugar with meals and bedtime. Check HgbA1c.      DVT prophylaxis: Padua score low.  Early ambulation for DVT prophylaxis.   Code Status:   Full Code  Family Communication:  Diagnosis and plan  discussed with patient.  Patient verbalized understanding agrees with plan.  Further recommendations to follow as clinical indicated Disposition Plan:   Patient is from:  Home  Anticipated DC to:  Home  Anticipated DC date:  Anticipate 2 midnight stay   Admission status:  Inpatient  Claudean Severance Madilynn Montante MD [facility]  How to contact the [facility] Attending or Consulting provider 7A - 7P or covering provider during after hours 7P -7A, for this patient?   Check the care team in [facility] and look for a) attending/consulting [facility] provider listed and b) the [facility] team listed Log into [email or web address] and use [place]'s universal password to access. If you do not have the password, please contact the hospital operator. Locate the [facility] provider you are looking for under [facility] and page to a number that you can be directly reached. If you still have difficulty reaching the provider, please page the [facility] (Director on Call) for the Hospitalists listed on amion for assistance.  01/07/2021, 4:54 AM

## 2021-01-07 NOTE — Progress Notes (Signed)
Pharmacy Antibiotic Note  Raymond Harrell is a 55 y.o. male admitted on 01/06/2021 with an unknown bite on his shoulder and wound infection with drainage.  Pharmacy has been consulted for piperacillin/tazobactam  dosing.  Plan: Piperacillin/tazobactam 3.375g Q8 hr EI Monitor cultures, clinical status, renal fx Narrow abx as able and f/u duration    Temp (24hrs), Avg:99 F (37.2 C), Min:98.1 F (36.7 C), Max:100.3 F (37.9 C)  Recent Labs  Lab 01/06/21 0010 01/06/21 1908 01/07/21 0459  WBC  --  9.1 9.2  CREATININE  --  1.20 0.89  LATICACIDVEN 1.4 2.0*  --     CrCl cannot be calculated (Unknown ideal weight.).    No Known Allergies  Antimicrobials this admission: CTX 8/1  Piptazo 8/1 >>    Microbiology results: none  Thank you for allowing pharmacy to be a part of this patient's care.  Alphia Moh, PharmD, BCPS, BCCP Clinical Pharmacist  Please check AMION for all [facility] phone numbers After 10:00 PM, call Main Pharmacy [phone]

## 2021-01-07 NOTE — Consult Note (Signed)
Reason for Consult:Spider bite Referring Physician: Lynden Oxford Time called: 1101 Time at bedside: 1122   Raymond Harrell is an 55 y.o. male.  HPI: Raymond Harrell was working in a dark place yesterday morning when he felt something bite or sting his right shoulder. He did not see what it was. About an hour later he began to have increased pain and swelling and noted he had a lesions. He also began to have subjective fevers and presented to the ED where he was admitted. Orthopedic surgery was consulted the following morning given size and appearance of wound.  Past Medical History:  Diagnosis Date   Abnormal CT scan    a. CT angio incidentally noted nodular opacity in upper abdomen felt lymph node vs part of pancreas, felt to be benign lesion with stable appearance.   Chest pain    a. Negative nuc 2010 (abnormal baseline EKG). b. Adm with pleuritic CP 02/2012: negative cardiac enzymes, CTA negative for PE  but did show bronchiectasis/scarring RUL.   Diabetes mellitus (HCC)    Diagnosed 02/2012-- 8/20/2020not on medication any longer-   Elevated blood pressure    Noted 02/2012   Elevated LDL cholesterol level    Noted 02/2012   History of kidney stones    Passed   Hypertension     Past Surgical History:  Procedure Laterality Date   HAND TENDON SURGERY Right    LAPAROSCOPIC APPENDECTOMY N/A 01/28/2019   Procedure: LAPAROSCOPIC APPENDECTOMY;  Surgeon: Harriette Bouillon, MD;  Location: [facility] OR;  Service: General;  Laterality: N/A;    Family History  Problem Relation Age of Onset   Heart attack Father        MI in his 17's    Social History:  reports that he quit smoking about 17 years ago. He has never used smokeless tobacco. He reports current alcohol use of about 27.0 standard drinks of alcohol per week. He reports that he does not use drugs.  Allergies: No Known Allergies  Medications: I have reviewed the patient's current medications.  Results for orders placed or performed during  the hospital encounter of 01/06/21 (from the past 48 hour(s))  Lactic acid, plasma     Status: None   Collection Time: 01/06/21 12:10 AM  Result Value Ref Range   Lactic Acid, Venous 1.4 0.5 - 1.9 mmol/L    Comment: Performed at [facility], [address] [place], [place] [address]  CBC with Differential     Status: Abnormal   Collection Time: 01/06/21  7:08 PM  Result Value Ref Range   WBC 9.1 4.0 - 10.5 K/uL   RBC 4.95 4.22 - 5.81 MIL/uL   Hemoglobin 12.5 (L) 13.0 - 17.0 g/dL   HCT 37.9 02.4 - 09.7 %   MCV 81.2 80.0 - 100.0 fL   MCH 25.3 (L) 26.0 - 34.0 pg   MCHC 31.1 30.0 - 36.0 g/dL   RDW 35.3 29.9 - 24.2 %   Platelets 208 150 - 400 K/uL   nRBC 0.0 0.0 - 0.2 %   Neutrophils Relative % 63 %   Neutro Abs 5.6 1.7 - 7.7 K/uL   Lymphocytes Relative 26 %   Lymphs Abs 2.4 0.7 - 4.0 K/uL   Monocytes Relative 10 %   Monocytes Absolute 0.9 0.1 - 1.0 K/uL   Eosinophils Relative 1 %   Eosinophils Absolute 0.1 0.0 - 0.5 K/uL   Basophils Relative 0 %   Basophils Absolute 0.0 0.0 - 0.1 K/uL  Immature Granulocytes 0 %   Abs Immature Granulocytes 0.04 0.00 - 0.07 K/uL    Comment: Performed at [facility], [address] [place], [place] [address]  Comprehensive metabolic panel     Status: Abnormal   Collection Time: 01/06/21  7:08 PM  Result Value Ref Range   Sodium 138 135 - 145 mmol/L   Potassium 3.7 3.5 - 5.1 mmol/L   Chloride 106 98 - 111 mmol/L   CO2 22 22 - 32 mmol/L   Glucose, Bld 116 (H) 70 - 99 mg/dL    Comment: Glucose reference range applies only to samples taken after fasting for at least 8 hours.   BUN 16 6 - 20 mg/dL   Creatinine, Ser 9.141.20 0.61 - 1.24 mg/dL   Calcium 9.0 8.9 - 78.210.3 mg/dL   Total Protein 7.5 6.5 - 8.1 g/dL   Albumin 3.9 3.5 - 5.0 g/dL   AST 37 15 - 41 U/L   ALT 32 0 - 44 U/L   Alkaline Phosphatase 63 38 - 126 U/L   Total Bilirubin 0.3 0.3 - 1.2 mg/dL   GFR, Estimated >95>60 >62>60 mL/min    Comment: (NOTE) Calculated using the CKD-EPI  Creatinine Equation (2021)    Anion gap 10 5 - 15    Comment: Performed at [facility], [address] [place], [place] [address]  Lactic acid, plasma     Status: Abnormal   Collection Time: 01/06/21  7:08 PM  Result Value Ref Range   Lactic Acid, Venous 2.0 (HH) 0.5 - 1.9 mmol/L    Comment: CRITICAL RESULT CALLED TO, READ BACK BY AND VERIFIED WITH: C KNUCKLES RN BY SSTEPHENS 2006 [id number] Performed at [facility], [address] [place], [place] [address]   POC CBG, ED     Status: Abnormal   Collection Time: 01/06/21  7:11 PM  Result Value Ref Range   Glucose-Capillary 110 (H) 70 - 99 mg/dL    Comment: Glucose reference range applies only to samples taken after fasting for at least 8 hours.  Resp Panel by RT-PCR (Flu A&B, Covid) Nasopharyngeal Swab     Status: None   Collection Time: 01/07/21  1:00 AM   Specimen: Nasopharyngeal Swab; Nasopharyngeal(NP) swabs in vial transport medium  Result Value Ref Range   SARS Coronavirus 2 by RT PCR NEGATIVE NEGATIVE    Comment: (NOTE) SARS-CoV-2 target nucleic acids are NOT DETECTED.  The SARS-CoV-2 RNA is generally detectable in upper respiratory specimens during the acute phase of infection. The lowest concentration of SARS-CoV-2 viral copies this assay can detect is 138 copies/mL. A negative result does not preclude SARS-Cov-2 infection and should not be used as the sole basis for treatment or other patient management decisions. A negative result may occur with  improper specimen collection/handling, submission of specimen other than nasopharyngeal swab, presence of viral mutation(s) within the areas targeted by this assay, and inadequate number of viral copies(<138 copies/mL). A negative result must be combined with clinical observations, patient history, and epidemiological information. The expected result is Negative.  Fact Sheet for Patients:  BloggerCourse.comhttps://www.fda.gov/media/152166/download  Fact Sheet for Healthcare  Providers:  SeriousBroker.ithttps://www.fda.gov/media/152162/download  This test is no t yet approved or cleared by the [place] FDA and  has been authorized for detection and/or diagnosis of SARS-CoV-2 by FDA under an Emergency Use Authorization (EUA). This EUA will remain  in effect (meaning this test can be used) for the duration of the COVID-19 declaration under Section  564(b)(1) of the Act, 21 U.S.C.section 360bbb-3(b)(1), unless the authorization is terminated  or revoked sooner.       Influenza A by PCR NEGATIVE NEGATIVE   Influenza B by PCR NEGATIVE NEGATIVE    Comment: (NOTE) The Xpert Xpress SARS-CoV-2/FLU/RSV plus assay is intended as an aid in the diagnosis of influenza from Nasopharyngeal swab specimens and should not be used as a sole basis for treatment. Nasal washings and aspirates are unacceptable for Xpert Xpress SARS-CoV-2/FLU/RSV testing.  Fact Sheet for Patients: BloggerCourse.com  Fact Sheet for Healthcare Providers: SeriousBroker.it  This test is not yet approved or cleared by the [place] FDA and has been authorized for detection and/or diagnosis of SARS-CoV-2 by FDA under an Emergency Use Authorization (EUA). This EUA will remain in effect (meaning this test can be used) for the duration of the COVID-19 declaration under Section 564(b)(1) of the Act, 21 U.S.C. section 360bbb-3(b)(1), unless the authorization is terminated or revoked.  Performed at [facility], [address] [place], [place] [address]   Hemoglobin A1c     Status: Abnormal   Collection Time: 01/07/21  4:59 AM  Result Value Ref Range   Hgb A1c MFr Bld 6.8 (H) 4.8 - 5.6 %    Comment: (NOTE) Pre diabetes:          5.7%-6.4%  Diabetes:              >6.4%  Glycemic control for   <7.0% adults with diabetes    Mean Plasma Glucose 148.46 mg/dL    Comment: Performed at [facility], [address] [place], [place] [address]   HIV Antibody (routine testing w rflx)     Status: None   Collection Time: 01/07/21  4:59 AM  Result Value Ref Range   HIV Screen 4th Generation wRfx Non Reactive Non Reactive    Comment: Performed at [facility], [address] [place], [place] [address]  CBC     Status: Abnormal   Collection Time: 01/07/21  4:59 AM  Result Value Ref Range   WBC 9.2 4.0 - 10.5 K/uL   RBC 4.81 4.22 - 5.81 MIL/uL   Hemoglobin 12.3 (L) 13.0 - 17.0 g/dL   HCT 12.2 48.2 - 50.0 %   MCV 81.3 80.0 - 100.0 fL   MCH 25.6 (L) 26.0 - 34.0 pg   MCHC 31.5 30.0 - 36.0 g/dL   RDW 37.0 48.8 - 89.1 %   Platelets 188 150 - 400 K/uL   nRBC 0.0 0.0 - 0.2 %    Comment: Performed at [facility], [address] [place], [place] [address]  Basic metabolic panel     Status: Abnormal   Collection Time: 01/07/21  4:59 AM  Result Value Ref Range   Sodium 136 135 - 145 mmol/L   Potassium 3.8 3.5 - 5.1 mmol/L   Chloride 105 98 - 111 mmol/L   CO2 21 (L) 22 - 32 mmol/L   Glucose, Bld 105 (H) 70 - 99 mg/dL    Comment: Glucose reference range applies only to samples taken after fasting for at least 8 hours.   BUN 15 6 - 20 mg/dL   Creatinine, Ser 3.88 0.61 - 1.24 mg/dL   Calcium 8.5 (L) 8.9 - 10.3 mg/dL   GFR, Estimated >82 >80 mL/min    Comment: (NOTE) Calculated using the CKD-EPI Creatinine Equation (2021)    Anion gap 10 5 - 15    Comment: Performed  at [facility], [address], [place], [place] [address]  Glucose, capillary     Status: Abnormal   Collection Time: 01/07/21  6:09 AM  Result Value Ref Range   Glucose-Capillary 115 (H) 70 - 99 mg/dL    Comment: Glucose reference range applies only to samples taken after fasting for at least 8 hours.    No results found.  Review of Systems  Constitutional:  Negative for chills, diaphoresis and fever.  HENT:  Negative for ear discharge, ear pain, hearing loss and tinnitus.   Eyes:  Negative for photophobia and pain.  Respiratory:  Negative for  cough and shortness of breath.   Cardiovascular:  Negative for chest pain.  Gastrointestinal:  Negative for abdominal pain, nausea and vomiting.  Genitourinary:  Negative for dysuria, flank pain, frequency and urgency.  Musculoskeletal:  Positive for arthralgias (Right shoulder). Negative for back pain, myalgias and neck pain.  Neurological:  Negative for dizziness and headaches.  Hematological:  Does not bruise/bleed easily.  Psychiatric/Behavioral:  The patient is not nervous/anxious.   Blood pressure 139/76, pulse 82, temperature 98.8 F (37.1 C), temperature source Oral, resp. rate 17, SpO2 100 %. Physical Exam Constitutional:      General: He is not in acute distress.    Appearance: He is well-developed. He is not diaphoretic.  HENT:     Head: Normocephalic and atraumatic.  Eyes:     General: No scleral icterus.       Right eye: No discharge.        Left eye: No discharge.     Conjunctiva/sclera: Conjunctivae normal.  Cardiovascular:     Rate and Rhythm: Normal rate and regular rhythm.  Pulmonary:     Effort: Pulmonary effort is normal. No respiratory distress.  Musculoskeletal:     Cervical back: Normal range of motion.     Comments: Right shoulder, elbow, wrist, digits- Circular lesion with central necrosis lateral shoulder, mod TTP, no pain with AROM/PROM shoulder, no instability, no blocks to motion  Sens  Ax/R/M/U intact  Mot   Ax/ R/ PIN/ M/ AIN/ U intact  Rad 2+  Skin:    General: Skin is warm and dry.  Neurological:     Mental Status: He is alert.  Psychiatric:        Mood and Affect: Mood normal.        Behavior: Behavior normal.    Assessment/Plan: Right shoulder lesion -- Unknown etiology but spider bite certainly seems likely given history. Urgent I&D of brown recluse bites is contraindicated; recommendations are to let the lesion demarcate and stabilize before any surgery, if still indicated. Would continue to treat as cellulitis. Would can be washed with  soap and water daily and a clean, dry dressing applied. He should f/u with Dr. Carola Frost next Wednesday, would provide abx coverage until then.    Freeman Caldron, PA-C Orthopedic Surgery [phone] 01/07/2021, 12:01 PM

## 2021-01-07 NOTE — ED Notes (Signed)
Placed Breakfast Order 

## 2021-01-07 NOTE — ED Notes (Signed)
Attempted to call report to 5N - no answer

## 2021-01-07 NOTE — Plan of Care (Signed)

## 2021-01-08 DIAGNOSIS — L03119 Cellulitis of unspecified part of limb: Secondary | ICD-10-CM | POA: Diagnosis not present

## 2021-01-08 LAB — BASIC METABOLIC PANEL
Anion gap: 7 (ref 5–15)
BUN: 11 mg/dL (ref 6–20)
CO2: 23 mmol/L (ref 22–32)
Calcium: 9 mg/dL (ref 8.9–10.3)
Chloride: 104 mmol/L (ref 98–111)
Creatinine, Ser: 0.99 mg/dL (ref 0.61–1.24)
GFR, Estimated: >60 mL/min (ref >60)
Glucose, Bld: 120 mg/dL — ABNORMAL HIGH (ref 70–99)
Potassium: 3.6 mmol/L (ref 3.5–5.1)
Sodium: 134 mmol/L — ABNORMAL LOW (ref 135–145)

## 2021-01-08 LAB — CBC WITH DIFFERENTIAL/PLATELET
Abs Immature Granulocytes: 0.03 10*3/uL (ref 0.00–0.07)
Basophils Absolute: 0 10*3/uL (ref 0.0–0.1)
Basophils Relative: 0 %
Eosinophils Absolute: 0.1 10*3/uL (ref 0.0–0.5)
Eosinophils Relative: 1 %
HCT: 39.1 % (ref 39.0–52.0)
Hemoglobin: 12.3 g/dL — ABNORMAL LOW (ref 13.0–17.0)
Immature Granulocytes: 0 %
Lymphocytes Relative: 26 %
Lymphs Abs: 2.2 10*3/uL (ref 0.7–4.0)
MCH: 24.8 pg — ABNORMAL LOW (ref 26.0–34.0)
MCHC: 31.5 g/dL (ref 30.0–36.0)
MCV: 78.8 fL — ABNORMAL LOW (ref 80.0–100.0)
Monocytes Absolute: 1.1 10*3/uL — ABNORMAL HIGH (ref 0.1–1.0)
Monocytes Relative: 13 %
Neutro Abs: 5.2 10*3/uL (ref 1.7–7.7)
Neutrophils Relative %: 60 %
Platelets: 198 10*3/uL (ref 150–400)
RBC: 4.96 MIL/uL (ref 4.22–5.81)
RDW: 15 % (ref 11.5–15.5)
WBC: 8.7 10*3/uL (ref 4.0–10.5)
nRBC: 0 % (ref 0.0–0.2)

## 2021-01-08 LAB — GLUCOSE, CAPILLARY
Glucose-Capillary: 119 mg/dL — ABNORMAL HIGH (ref 70–99)
Glucose-Capillary: 95 mg/dL (ref 70–99)
Glucose-Capillary: 144 mg/dL — ABNORMAL HIGH (ref 70–99)

## 2021-01-08 MED ORDER — AMOXICILLIN-POT CLAVULANATE 875-125 MG PO TABS: 1.0000 | ORAL_TABLET | Freq: Two times a day (BID) | ORAL | 0 refills | Status: AC

## 2021-01-08 NOTE — Plan of Care (Signed)
  Problem: Clinical Measurements: Goal: Ability to maintain clinical measurements within normal limits will improve Outcome: Progressing   

## 2021-01-08 NOTE — Discharge Summary (Signed)
[facility] Discharge Summary   Patient: Raymond Harrell VOH:[id number]  PCP: Gaspar Garbe, MD  Date of admission: 01/06/2021   Date of discharge:  01/08/2021     Discharge Diagnoses:  Principal diagnosis Cellulitis of the shoulder, possible brown recluse spider bite Principal Problem:   Cellulitis of shoulder Active Problems:   Essential hypertension   Type 2 diabetes mellitus with hyperlipidemia (HCC)   Admitted From: Home Disposition:  Home   Recommendations for Outpatient Follow-up:  PCP: Follow-up with PCP in 1 week.  Follow-up with orthopedic as recommended.   Follow-up Information     Myrene Galas, MD. Schedule an appointment as soon as possible for a visit on 01/16/2021.   Specialty: Orthopedic Surgery Contact information: [address] [place] [address] [phone]         Tisovec, Adelfa Koh, MD. Schedule an appointment as soon as possible for a visit.   Specialty: Internal Medicine Contact information: [address] [place] [address] [phone]                Discharge Instructions     Diet - low sodium heart healthy   Complete by: As directed    Discharge wound care:   Complete by: As directed    washed with soap and water daily and a clean, dry nonadherent dressing applied.   Increase activity slowly   Complete by: As directed        Diet recommendation: Cardiac diet  Activity: The patient is advised to gradually reintroduce usual activities, as tolerated  Discharge Condition: stable  Code Status: Full code   History of present illness: As per the H and P dictated on admission, "Raymond Harrell is a 55 y.o. male with medical history significant for DMT2, HTN, HLD who presents for evaluation of not feeling well after being bit by something yesterday afternoon.  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  He states he  felt a small sting on his right shoulder but did not think anything of it until later when he started to feel bad.  Around 1:00 he reports he felt bad that he described as feeling weak and dizzy and having chills and a fever.  He went home and decided just to rest but his right shoulder started to hurt worse in the area where he thinks he was bitten.  He is not sure if it was a spider or some other insect as he did not see it.  He states that it felt like a sharp sting but over the next few hours it became a throbbing dull pain.  He developed a black area on his posterior shoulder where he was stung with surrounding erythema that was warm to touch.  He reports his shoulder had increased pain with movement of his shoulder. He has diabetes but does not remember the names of his medications. He states his blood sugar has been controlled.    ED Course: In the emergency room patient has been hemodynamically stable.  He was given Tylenol and fever has broke.  He was given antibiotic with Rocephin.  He has had a black eschar that is about 4 cm in diameter when he arrived and over the next few hours it developed small blisters and weeping with some sloughing of the skin at 1 edge.  With the progression of the wound as well service  asked to admit for further management.  CBC was unremarkable.  Electrolytes and renal function were normal.  COVID-19 swab was negative."  Hospital Course:  Summary of his active problems in the hospital is as following. Insect bite of right shoulder possible brown recluse spider. Cellulitis of the right shoulder. Seen in the ER. Started on antibiotics. Cultures were not performed. Discussed with orthopedic.  Recommend no debridement in hospital and we need to allow time to demarcate the process. Patient will follow-up with orthopedics outpatient. Will provide 10 days of oral Augmentin. Call the pharmacy to discontinue [facility] prescription.  Hyperlipidemia Hypertension Continue  home regimen.  Obesity. Placing the patient at high risk for poor outcome.  Body mass index is 31.7 kg/m.   Patient was ambulatory without any assistance. On the day of the discharge the patient's vitals were stable, and no other new acute medical condition were reported. The patient was felt safe to be discharge at Home with no therapy needed on discharge.  Consultants: Orthopedics Procedures: None  DISCHARGE MEDICATION: Allergies as of 01/08/2021       Reactions   Other Shortness Of Breath, Swelling, Other (See Comments)   Spider venom = Dizziness, tight throat, bruising, and swelling- Required HOSPITALIZATION in 2022        Medication List     TAKE these medications    acetaminophen 500 MG tablet Commonly known as: TYLENOL Take 1,000 mg by mouth every 6 (six) hours as needed (for pain.).   amoxicillin-clavulanate 875-125 MG tablet Commonly known as: Augmentin Take 1 tablet by mouth 2 (two) times daily for 10 days. What changed: additional instructions   aspirin 81 MG EC tablet Take 81 mg by mouth daily.   Fish Oil 500 MG Caps Take 500 mg by mouth daily.   Garlic 1000 MG Caps Take 1,000 mg by mouth daily.   latanoprost 0.005 % ophthalmic solution Commonly known as: XALATAN Place 1 drop into both eyes at bedtime.   lisinopril 20 MG tablet Commonly known as: ZESTRIL Take 1 tablet (20 mg total) by mouth daily.   metFORMIN 500 MG tablet Commonly known as: GLUCOPHAGE Take 500 mg by mouth 2 (two) times daily.   multivitamin with minerals Tabs tablet Take 1 tablet by mouth daily.   rosuvastatin 40 MG tablet Commonly known as: CRESTOR Take 40 mg by mouth daily. What changed: Another medication with the same name was removed. Continue taking this medication, and follow the directions you see here.   vitamin C 500 MG tablet Commonly known as: ASCORBIC ACID Take 500 mg by mouth daily.               Discharge Care Instructions  (From admission,  onward)           Start     Ordered   01/08/21 0000  Discharge wound care:       Comments: washed with soap and water daily and a clean, dry nonadherent dressing applied.   01/08/21 1130            Discharge Exam: Filed Weights   01/07/21 1511  Weight: 118.1 kg   Vitals:   01/08/21 0900 01/08/21 1424  BP: 128/78 123/76  Pulse: 76 75  Resp: 17 18  Temp: 98.9 F (37.2 C) 98.9 F (37.2 C)  SpO2: 100% 100%   General: Appear in no distress, no Rash; Oral Mucosa Clear, moist. no Abnormal Neck Mass Or lumps, Conjunctiva normal  Cardiovascular: S1 and S2 Present, no Murmur  Respiratory: good respiratory effort, Bilateral Air entry present and CTA, no Crackles, no wheezes Abdomen: Bowel Sound present, Soft and no tenderness Extremities: no Pedal edema, shoulder ulcer appears to be stable, shoulder edema improving. Neurology: alert and oriented to time, place, and person affect appropriate. no new focal deficit  The results of significant diagnostics from this hospitalization (including imaging, microbiology, ancillary and laboratory) are listed below for reference.    Significant Diagnostic Studies: No results found.  Microbiology: Recent Results (from the past 240 hour(s))  Resp Panel by RT-PCR (Flu A&B, Covid) Nasopharyngeal Swab     Status: None   Collection Time: 01/07/21  1:00 AM   Specimen: Nasopharyngeal Swab; Nasopharyngeal(NP) swabs in vial transport medium  Result Value Ref Range Status   SARS Coronavirus 2 by RT PCR NEGATIVE NEGATIVE Final    Comment: (NOTE) SARS-CoV-2 target nucleic acids are NOT DETECTED.  The SARS-CoV-2 RNA is generally detectable in upper respiratory specimens during the acute phase of infection. The lowest concentration of SARS-CoV-2 viral copies this assay can detect is 138 copies/mL. A negative result does not preclude SARS-Cov-2 infection and should not be used as the sole basis for treatment or other patient management decisions.  A negative result may occur with  improper specimen collection/handling, submission of specimen other than nasopharyngeal swab, presence of viral mutation(s) within the areas targeted by this assay, and inadequate number of viral copies(<138 copies/mL). A negative result must be combined with clinical observations, patient history, and epidemiological information. The expected result is Negative.  Fact Sheet for Patients:  BloggerCourse.comhttps://www.fda.gov/media/152166/download  Fact Sheet for Healthcare Providers:  SeriousBroker.ithttps://www.fda.gov/media/152162/download  This test is no t yet approved or cleared by the [place] FDA and  has been authorized for detection and/or diagnosis of SARS-CoV-2 by FDA under an Emergency Use Authorization (EUA). This EUA will remain  in effect (meaning this test can be used) for the duration of the COVID-19 declaration under Section 564(b)(1) of the Act, 21 U.S.C.section 360bbb-3(b)(1), unless the authorization is terminated  or revoked sooner.       Influenza A by PCR NEGATIVE NEGATIVE Final   Influenza B by PCR NEGATIVE NEGATIVE Final    Comment: (NOTE) The Xpert Xpress SARS-CoV-2/FLU/RSV plus assay is intended as an aid in the diagnosis of influenza from Nasopharyngeal swab specimens and should not be used as a sole basis for treatment. Nasal washings and aspirates are unacceptable for Xpert Xpress SARS-CoV-2/FLU/RSV testing.  Fact Sheet for Patients: BloggerCourse.comhttps://www.fda.gov/media/152166/download  Fact Sheet for Healthcare Providers: SeriousBroker.ithttps://www.fda.gov/media/152162/download  This test is not yet approved or cleared by the [place] FDA and has been authorized for detection and/or diagnosis of SARS-CoV-2 by FDA under an Emergency Use Authorization (EUA). This EUA will remain in effect (meaning this test can be used) for the duration of the COVID-19 declaration under Section 564(b)(1) of the Act, 21 U.S.C. section 360bbb-3(b)(1), unless the authorization  is terminated or revoked.  Performed at [facility], [address] [place], [place] [address]      Labs: CBC: Recent Labs  Lab 01/06/21 1908 01/07/21 0459 01/08/21 0153  WBC 9.1 9.2 8.7  NEUTROABS 5.6  --  5.2  HGB 12.5* 12.3* 12.3*  HCT 40.2 39.1 39.1  MCV 81.2 81.3 78.8*  PLT 208 188 198   Basic Metabolic Panel: Recent Labs  Lab 01/06/21 1908 01/07/21 0459 01/08/21 0153  NA 138 136 134*  K 3.7 3.8 3.6  CL 106 105 104  CO2 22 21* 23  GLUCOSE  116* 105* 120*  BUN 16 15 11   CREATININE 1.20 0.89 0.99  CALCIUM 9.0 8.5* 9.0   Liver Function Tests: Recent Labs  Lab 01/06/21 1908  AST 37  ALT 32  ALKPHOS 63  BILITOT 0.3  PROT 7.5  ALBUMIN 3.9   CBG: Recent Labs  Lab 01/07/21 1610 01/07/21 2130 01/08/21 0655 01/08/21 1204 01/08/21 1710  GLUCAP 124* 111* 119* 95 144*    Time spent: 35 minutes  Signed:  03/10/21  [facility]  01/08/2021

## 2021-01-08 NOTE — Progress Notes (Signed)
Discharge summary packet provided to pt with instructions. Pt verbalized understanding of instructions. Supply provided to pt. Pt d/c to home as ordered. All questions and concerns were fully addressed. Per pt her spouse is responsible for her ride, and she will be here around 5pmish.

## 2021-04-15 ENCOUNTER — Encounter (HOSPITAL_COMMUNITY): Payer: Self-pay | Admitting: Emergency Medicine

## 2021-04-15 ENCOUNTER — Ambulatory Visit (HOSPITAL_COMMUNITY)
Admission: EM | Admit: 2021-04-15 | Discharge: 2021-04-15 | Disposition: A | Discharge status: Home / Self Care | Payer: Managed Care, Other (non HMO)

## 2021-04-15 ENCOUNTER — Other Ambulatory Visit: Payer: Self-pay

## 2021-04-15 DIAGNOSIS — M79605 Pain in left leg: Secondary | ICD-10-CM

## 2021-04-15 NOTE — ED Provider Notes (Signed)
[facility]    CSN: [id number] Arrival date & time: 04/15/21  5621      History   Chief Complaint Chief Complaint  Patient presents with   Leg Pain    HPI Raymond Harrell is a 55 y.o. male who presents with onset of L lower leg pain since this am. He describes the pain as burning and is intermittent. Denies paresthesia. Has never had this before. Denies injuring himself on this area.  Has not taken his medication today.    Past Medical History:  Diagnosis Date   Abnormal CT scan    a. CT angio incidentally noted nodular opacity in upper abdomen felt lymph node vs part of pancreas, felt to be benign lesion with stable appearance.   Chest pain    a. Negative nuc 2010 (abnormal baseline EKG). b. Adm with pleuritic CP 02/2012: negative cardiac enzymes, CTA negative for PE  but did show bronchiectasis/scarring RUL.   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 Authorizing Provider  aspirin 81 MG EC tablet Take 81  mg by mouth daily.    Yes [provider]  Garlic 1000 MG CAPS Take 1,000 mg by mouth daily.   Yes [provider]  lisinopril (PRINIVIL,ZESTRIL) 20 MG tablet Take 1 tablet (20 mg total) by mouth daily. 12/09/17  Yes Mancel Bale, MD  metFORMIN (GLUCOPHAGE) 500 MG tablet Take 500 mg by mouth 2 (two) times daily. 09/23/20  Yes [provider]  Multiple Vitamin (MULTIVITAMIN WITH MINERALS) TABS tablet Take 1 tablet by mouth daily.   Yes [provider]  Omega-3 Fatty Acids (FISH OIL) 500 MG CAPS Take 500 mg by mouth daily.   Yes [provider]  rosuvastatin (CRESTOR) 40 MG tablet Take 40 mg by mouth daily.   Yes [provider]  vitamin C (ASCORBIC ACID) 500 MG tablet Take 500 mg by mouth daily.   Yes [provider]  acetaminophen (TYLENOL) 500 MG tablet Take 1,000 mg by mouth every 6 (six) hours as needed (for pain.).    [provider]  latanoprost (XALATAN) 0.005 % ophthalmic solution Place 1 drop into both eyes at bedtime. Patient not taking: Reported on 04/15/2021 08/14/20   [provider]    Family History Family History  Problem Relation Age of Onset   Heart attack Father        MI in his 5's    Social History Social History   Tobacco  Use   Smoking status: Former    Years: 24.00    Types: Cigarettes    Quit date: 2005    Years since quitting: 17.8   Smokeless tobacco: Never  Vaping Use   Vaping Use: Never used  Substance Use Topics   Alcohol use: Yes    Alcohol/week: 27.0 standard drinks    Types: 27 Cans of beer per week   Drug use: No     Allergies   Other   Review of Systems Review of Systems  Constitutional:  Positive for diaphoresis.       This am   Skin:  Negative for color change, pallor, rash and wound.  Neurological:  Negative for weakness and numbness.  Hematological:  Negative for adenopathy.    Physical Exam Triage Vital Signs ED Triage Vitals  Enc Vitals Group      BP 04/15/21 1047 (!) 156/86     Pulse Rate 04/15/21 1047 86     Resp 04/15/21 1047 20     Temp 04/15/21 1047 99.2 F (37.3 C)     Temp Source 04/15/21 1047 Oral     SpO2 04/15/21 1047 99 %     Weight --      Height --      Head Circumference --      Peak Flow --      Pain Score 04/15/21 1044 6     Pain Loc --      Pain Edu? --      Excl. in [place]? --    No data found.  Updated Vital Signs BP (!) 156/86 (BP Location: Right Arm) Comment (BP Location): large cuff  Pulse 86   Temp 99.2 F (37.3 C) (Oral)   Resp 20   SpO2 99%   Visual Acuity Right Eye Distance:   Left Eye Distance:   Bilateral Distance:    Right Eye Near:   Left Eye Near:    Bilateral Near:     Physical Exam Vitals and nursing note reviewed.  Constitutional:      General: He is not in acute distress.    Appearance: He is not toxic-appearing.  HENT:     Head: Normocephalic.     Right Ear: External ear normal.     Left Ear: External ear normal.  Eyes:     General: No scleral icterus.    Conjunctiva/sclera: Conjunctivae normal.  Pulmonary:     Effort: Pulmonary effort is normal.  Musculoskeletal:        General: No swelling, deformity or signs of injury. Normal range of motion.     Cervical back: Neck supple.     Right lower leg: No edema.     Left lower leg: No edema.     Comments: L LOWER LEG- with no asymmetry. Negative Hoffman's sign. Has minimal discomfort with palpation on mid lateral lower leg where he has the symptoms. Has prominent superficial varicosity right above it. Area is not hot or red. Movement of his left foot up and down or walking on tip toes or heels did not provoke his discomfort.   Skin:    General: Skin is warm and dry.     Capillary Refill: Capillary refill takes less than 2 seconds.     Findings: No bruising, erythema or rash.  Neurological:     Mental Status: He is alert and oriented to person, place, and time.     Gait: Gait normal.     Deep Tendon Reflexes: Reflexes  normal.  Psychiatric:        Mood and Affect: Mood normal.        Behavior: Behavior normal.        Thought Content: Thought content normal.        Judgment: Judgment normal.     UC Treatments / Results  Labs (all labs ordered are listed, but only abnormal results are displayed) Labs Reviewed - No data to display  EKG   Radiology No results found.  Procedures Procedures (including critical care time)  Medications Ordered in UC Medications - No data to display  Initial Impression / Assessment and Plan / UC Course  I have reviewed the triage vital signs and the nursing notes. L lateral leg intermittent burning which I suspect has early superficial thrombophlebitis. I recommended him to to apply heat and take Aspirin 325 mg bid x 7 days. See instructions.     Final Clinical Impressions(s) / UC Diagnoses   Final diagnoses:  Left leg pain     Discharge Instructions      Apply warm compress for 20 minutes three times a day for 5 days Stop the baby aspirin Take Aspirin 325 mg twice a day for 7 days, then go back to your baby aspirin.  If your pain gets worse and your leg gets more swollen, larger than your right, then go to the ER to get more test done.      ED Prescriptions   None    PDMP not reviewed this encounter.   Shelby Mattocks, PA-C 04/15/21 1255

## 2021-04-15 NOTE — Discharge Instructions (Addendum)
Apply warm compress for 20 minutes three times a day for 5 days Stop the baby aspirin Take Aspirin 325 mg twice a day for 7 days, then go back to your baby aspirin.  If your pain gets worse and your leg gets more swollen, larger than your right, then go to the ER to get more test done.

## 2021-04-15 NOTE — ED Triage Notes (Signed)
Left lower leg pain that started this morning.  Reports a burning sensation that waxes and wanes.  Pain is lateral left lower leg.  No numbness or tingling.  Patient had a sweating episode this morning as well

## 2021-04-15 NOTE — ED Notes (Signed)
Spoke to dr hagler about patient.  Dr hagler did speak to patient and family in the intake room.  Patient can wait to be seen at [facility]

## 2023-03-20 ENCOUNTER — Other Ambulatory Visit: Payer: Self-pay

## 2023-03-20 ENCOUNTER — Observation Stay (HOSPITAL_COMMUNITY)
Admission: EM | Admit: 2023-03-20 | Discharge: 2023-03-23 | Disposition: A | Discharge status: Home / Self Care | Payer: Managed Care, Other (non HMO) | Attending: Internal Medicine | Admitting: Internal Medicine

## 2023-03-20 ENCOUNTER — Emergency Department (HOSPITAL_COMMUNITY): Payer: Managed Care, Other (non HMO)

## 2023-03-20 ENCOUNTER — Encounter (HOSPITAL_COMMUNITY): Payer: Self-pay | Admitting: *Deleted

## 2023-03-20 DIAGNOSIS — Z87891 Personal history of nicotine dependence: Secondary | ICD-10-CM | POA: Diagnosis not present

## 2023-03-20 DIAGNOSIS — I200 Unstable angina: Secondary | ICD-10-CM

## 2023-03-20 DIAGNOSIS — R079 Chest pain, unspecified: Secondary | ICD-10-CM | POA: Diagnosis present

## 2023-03-20 DIAGNOSIS — I10 Essential (primary) hypertension: Secondary | ICD-10-CM | POA: Diagnosis not present

## 2023-03-20 DIAGNOSIS — E119 Type 2 diabetes mellitus without complications: Secondary | ICD-10-CM | POA: Diagnosis not present

## 2023-03-20 DIAGNOSIS — Z7984 Long term (current) use of oral hypoglycemic drugs: Secondary | ICD-10-CM | POA: Insufficient documentation

## 2023-03-20 DIAGNOSIS — I25110 Atherosclerotic heart disease of native coronary artery with unstable angina pectoris: Secondary | ICD-10-CM | POA: Insufficient documentation

## 2023-03-20 DIAGNOSIS — I2510 Atherosclerotic heart disease of native coronary artery without angina pectoris: Secondary | ICD-10-CM | POA: Insufficient documentation

## 2023-03-20 DIAGNOSIS — E785 Hyperlipidemia, unspecified: Secondary | ICD-10-CM

## 2023-03-20 DIAGNOSIS — Z79899 Other long term (current) drug therapy: Secondary | ICD-10-CM | POA: Diagnosis not present

## 2023-03-20 DIAGNOSIS — Z7982 Long term (current) use of aspirin: Secondary | ICD-10-CM | POA: Diagnosis not present

## 2023-03-20 LAB — CBC
HCT: 42 % (ref 39.0–52.0)
Hemoglobin: 13.3 g/dL (ref 13.0–17.0)
MCH: 25.8 pg — ABNORMAL LOW (ref 26.0–34.0)
MCHC: 31.7 g/dL (ref 30.0–36.0)
MCV: 81.4 fL (ref 80.0–100.0)
Platelets: 215 10*3/uL (ref 150–400)
RBC: 5.16 MIL/uL (ref 4.22–5.81)
RDW: 14.9 % (ref 11.5–15.5)
WBC: 6.6 10*3/uL (ref 4.0–10.5)
nRBC: 0 % (ref 0.0–0.2)

## 2023-03-20 LAB — TROPONIN I (HIGH SENSITIVITY)
Troponin I (High Sensitivity): 23 ng/L — ABNORMAL HIGH (ref <18)
Troponin I (High Sensitivity): 20 ng/L — ABNORMAL HIGH (ref <18)

## 2023-03-20 LAB — BASIC METABOLIC PANEL
Anion gap: 14 (ref 5–15)
BUN: 17 mg/dL (ref 6–20)
CO2: 21 mmol/L — ABNORMAL LOW (ref 22–32)
Calcium: 9.7 mg/dL (ref 8.9–10.3)
Chloride: 99 mmol/L (ref 98–111)
Creatinine, Ser: 0.93 mg/dL (ref 0.61–1.24)
GFR, Estimated: >60 mL/min (ref >60)
Glucose, Bld: 106 mg/dL — ABNORMAL HIGH (ref 70–99)
Potassium: 3.8 mmol/L (ref 3.5–5.1)
Sodium: 134 mmol/L — ABNORMAL LOW (ref 135–145)

## 2023-03-20 MED ORDER — IOHEXOL 350 MG/ML SOLN
100.0000 mL | Freq: Once | INTRAVENOUS | Status: AC | PRN
Start: 2023-03-20 — End: 2023-03-20
  Administered 2023-03-20: 100 mL via INTRAVENOUS

## 2023-03-20 MED ORDER — MORPHINE SULFATE (PF) 4 MG/ML IV SOLN
4.0000 mg | Freq: Once | INTRAVENOUS | Status: AC
  Administered 2023-03-20: 4 mg via INTRAVENOUS
  Filled 2023-03-20: qty 1

## 2023-03-20 MED ORDER — NITROGLYCERIN 0.4 MG SL SUBL
0.4000 mg | SUBLINGUAL_TABLET | Freq: Once | SUBLINGUAL | Status: AC
  Administered 2023-03-20: 0.4 mg via SUBLINGUAL
  Filled 2023-03-20: qty 1

## 2023-03-20 NOTE — ED Triage Notes (Signed)
The pt is c/o chest pain since Wednesday  no sob or nausea

## 2023-03-20 NOTE — ED Notes (Signed)
Report received from [facility]. Assumed care of pt at this time.

## 2023-03-20 NOTE — ED Notes (Signed)
ED Provider at bedside. 

## 2023-03-20 NOTE — ED Notes (Signed)
Pt returned to room from CT

## 2023-03-20 NOTE — ED Notes (Signed)
Patient transported to CT 

## 2023-03-20 NOTE — ED Provider Notes (Addendum)
[facility]ERGENCY DEPARTMENT AT [facility] Provider Note   CSN: [id number] Arrival date & time: 03/20/23  1624     History  Chief Complaint  Patient presents with   Chest Pain    Raymond Harrell is a 57 y.o. male.  HPI   57 year old male presents emergency department with concern for back and chest pain.  Patient states that this started about 3 days ago, initially was intermittent, self resolved however has now been more persistent and worsening.  He describes the pain as originating just to the right of his upper mid back and going straight through to his chest, feels sharp.  No associated shortness of breath.  No cough.  No history of CAD, aneurysm or PE.  Denies any vomiting or diarrhea.  No swelling of his legs.  Home Medications Prior to Admission medications   Medication Sig Start Date End Date Taking? Authorizing Provider  acetaminophen (TYLENOL) 500 MG tablet Take 1,000 mg by mouth every 6 (six) hours as needed (for pain.).    [provider]  aspirin 81 MG EC tablet Take 81 mg by mouth daily.     [provider]  Garlic 1000 MG CAPS Take 1,000 mg by mouth daily.    [provider]  latanoprost (XALATAN) 0.005 % ophthalmic solution Place 1 drop into both eyes at bedtime. Patient not taking: Reported on 04/15/2021 08/14/20   [provider]  lisinopril (PRINIVIL,ZESTRIL) 20 MG tablet Take 1 tablet (20 mg total) by mouth daily. 12/09/17   Mancel Bale, MD  metFORMIN (GLUCOPHAGE) 500 MG tablet Take 500 mg by mouth 2 (two) times daily. 09/23/20   [provider]  Multiple Vitamin (MULTIVITAMIN WITH MINERALS) TABS tablet Take 1 tablet by mouth daily.    [provider]  Omega-3 Fatty Acids (FISH OIL) 500 MG CAPS Take 500 mg by mouth daily.    [provider]  rosuvastatin (CRESTOR) 40 MG tablet Take 40 mg by mouth daily.    [provider]  vitamin C (ASCORBIC ACID) 500 MG tablet Take 500 mg  by mouth daily.    [provider]      Allergies    Other    Review of Systems   Review of Systems  Constitutional:  Negative for fever.  Respiratory:  Negative for cough and shortness of breath.   Cardiovascular:  Positive for chest pain. Negative for palpitations and leg swelling.  Gastrointestinal:  Negative for abdominal pain, diarrhea and vomiting.  Musculoskeletal:  Positive for back pain. Negative for neck pain.  Skin:  Negative for rash.  Neurological:  Negative for headaches.    Physical Exam Updated Vital Signs BP (!) 141/79   Pulse 73   Temp 97.8 F (36.6 C) (Oral)   Resp 18   Ht 6\' 4"  (1.93 m)   Wt 118.1 kg   SpO2 95%   BMI 31.69 kg/m  Physical Exam Vitals and nursing note reviewed.  Constitutional:      Appearance: Normal appearance.  HENT:     Head: Normocephalic.     Mouth/Throat:     Mouth: Mucous membranes are moist.  Cardiovascular:     Rate and Rhythm: Normal rate.  Pulmonary:     Effort: Pulmonary effort is normal. No respiratory distress.     Breath sounds: No decreased breath sounds.  Chest:     Chest wall: No tenderness or crepitus.  Abdominal:     Palpations: Abdomen is soft.  Tenderness: There is no abdominal tenderness.  Musculoskeletal:     Right lower leg: No edema.     Left lower leg: No edema.  Skin:    General: Skin is warm.  Neurological:     Mental Status: He is alert and oriented to person, place, and time. Mental status is at baseline.  Psychiatric:        Mood and Affect: Mood normal.     ED Results / Procedures / Treatments   Labs (all labs ordered are listed, but only abnormal results are displayed) Labs Reviewed  BASIC METABOLIC PANEL - Abnormal; Notable for the following components:      Result Value   Sodium 134 (*)    CO2 21 (*)    Glucose, Bld 106 (*)    All other components within normal limits  CBC - Abnormal; Notable for the following components:   MCH 25.8 (*)    All other components  within normal limits  TROPONIN I (HIGH SENSITIVITY) - Abnormal; Notable for the following components:   Troponin I (High Sensitivity) 23 (*)    All other components within normal limits  TROPONIN I (HIGH SENSITIVITY) - Abnormal; Notable for the following components:   Troponin I (High Sensitivity) 20 (*)    All other components within normal limits    EKG EKG Interpretation Date/Time:  Friday March 20 2023 16:37:06 EDT Ventricular Rate:  87 PR Interval:  164 QRS Duration:  88 QT Interval:  360 QTC Calculation: 433 R Axis:   39  Text Interpretation: Normal sinus rhythm T wave abnormality, consider lateral ischemia Abnormal ECG When compared with ECG of 07-Jan-2021 00:27, PREVIOUS ECG IS PRESENT Confirmed by Coralee Pesa [phone]) on 03/20/2023 7:20:07 PM  Radiology DG Chest 2 View  Result Date: 03/20/2023 CLINICAL DATA:  Chest pain EXAM: CHEST - 2 VIEW COMPARISON:  12/19/2018 FINDINGS: Right apical scarring, stable. Lungs otherwise clear. No effusions. Heart and mediastinal contours within normal limits. No acute bony abnormality. IMPRESSION: No active cardiopulmonary disease. Electronically Signed   By: Charlett Nose M.D.   On: 03/20/2023 19:12    Procedures Procedures    Medications Ordered in ED Medications  morphine (PF) 4 MG/ML injection 4 mg (4 mg Intravenous Given 03/20/23 2049)  iohexol (OMNIPAQUE) 350 MG/ML injection 100 mL (100 mLs Intravenous Contrast Given 03/20/23 2127)    ED Course/ Medical Decision Making/ A&P                                 Medical Decision Making Amount and/or Complexity of Data Reviewed Radiology: ordered.  Risk Prescription drug management. Decision regarding hospitalization.   57 year old male presents emergency department with upper back pain that radiates to the right chest.  Initially intermittent but now persistent and worsening.  No history of CAD but has history of HTN/DM.  Vitals are normal and stable on arrival.  EKG has  some T wave inversions in the lateral leads but this is normal for the patient.  Blood work is baseline outside of a troponin that is slightly elevated at 23 trended to 20.  No previous to compare to baseline.  After dose of morphine the pain is improved but not resolved.  Chest x-ray did not identify any acute abnormality.  Given the pain pattern a CTA was done which ruled out dissection.  Consulted with on-call cardiology fellow, Dr. Aron Baba.  Given his history of HTN, DM as well  as calcification seen on the CT even though his pain seems atypical and troponins are flat we would recommend admission for further evaluation and treatment.  Recommend trending troponins and possible CTA C or stress test. Plan for cardiology consult. Patient is amendable to this plan.  Will try other regimens for pain control.  Otherwise he is overall well-appearing and stable.  Patients evaluation and results requires admission for further treatment and care.  Spoke with hospitalist, reviewed patient's ED course and they accept admission.  Patient agrees with admission plan, offers no new complaints and is stable/unchanged at time of admit.        Final Clinical Impression(s) / ED Diagnoses Final diagnoses:  None    Rx / DC Orders ED Discharge Orders     None         Rozelle Logan, DO 03/20/23 2347    Rozelle Logan, DO 03/21/23 0022

## 2023-03-21 ENCOUNTER — Observation Stay (HOSPITAL_BASED_OUTPATIENT_CLINIC_OR_DEPARTMENT_OTHER): Payer: Managed Care, Other (non HMO)

## 2023-03-21 DIAGNOSIS — R079 Chest pain, unspecified: Secondary | ICD-10-CM

## 2023-03-21 DIAGNOSIS — E119 Type 2 diabetes mellitus without complications: Secondary | ICD-10-CM | POA: Diagnosis not present

## 2023-03-21 DIAGNOSIS — I200 Unstable angina: Secondary | ICD-10-CM

## 2023-03-21 DIAGNOSIS — E785 Hyperlipidemia, unspecified: Secondary | ICD-10-CM

## 2023-03-21 HISTORY — DX: Unstable angina: I20.0

## 2023-03-21 LAB — ECHOCARDIOGRAM COMPLETE
Area-P 1/2: 2.99 cm2
Est EF: 55
Height: 76 in
S' Lateral: 2.6 cm
Weight: 4165.81 [oz_av]

## 2023-03-21 LAB — GLUCOSE, CAPILLARY
Glucose-Capillary: 110 mg/dL — ABNORMAL HIGH (ref 70–99)
Glucose-Capillary: 132 mg/dL — ABNORMAL HIGH (ref 70–99)

## 2023-03-21 LAB — LIPID PANEL
Cholesterol: 138 mg/dL (ref 0–200)
HDL: 61 mg/dL (ref >40)
LDL Cholesterol: 61 mg/dL (ref 0–99)
Total CHOL/HDL Ratio: 2.3 {ratio}
Triglycerides: 82 mg/dL (ref <150)
VLDL: 16 mg/dL (ref 0–40)

## 2023-03-21 LAB — HIV ANTIBODY (ROUTINE TESTING W REFLEX): HIV Screen 4th Generation wRfx: NONREACTIVE

## 2023-03-21 LAB — CBG MONITORING, ED
Glucose-Capillary: 127 mg/dL — ABNORMAL HIGH (ref 70–99)
Glucose-Capillary: 165 mg/dL — ABNORMAL HIGH (ref 70–99)
Glucose-Capillary: 139 mg/dL — ABNORMAL HIGH (ref 70–99)

## 2023-03-21 LAB — HEMOGLOBIN A1C
Hgb A1c MFr Bld: 7.1 % — ABNORMAL HIGH (ref 4.8–5.6)
Mean Plasma Glucose: 157.07 mg/dL

## 2023-03-21 MED ORDER — ACETAMINOPHEN 325 MG PO TABS: 650.0000 mg | ORAL_TABLET | Freq: Four times a day (QID) | ORAL | Status: DC | PRN

## 2023-03-21 MED ORDER — ACETAMINOPHEN 650 MG RE SUPP: 650.0000 mg | Freq: Four times a day (QID) | RECTAL | Status: DC | PRN

## 2023-03-21 MED ORDER — LISINOPRIL 20 MG PO TABS
20.0000 mg | ORAL_TABLET | Freq: Every day | ORAL | Status: DC
  Administered 2023-03-21 – 2023-03-23 (×3): 20 mg via ORAL
  Filled 2023-03-21 (×3): qty 1

## 2023-03-21 MED ORDER — INSULIN ASPART 100 UNIT/ML IJ SOLN: 0.0000 [IU] | Freq: Every day | INTRAMUSCULAR | Status: DC

## 2023-03-21 MED ORDER — PERFLUTREN LIPID MICROSPHERE
1.0000 mL | INTRAVENOUS | Status: AC | PRN
  Administered 2023-03-21: 3 mL via INTRAVENOUS

## 2023-03-21 MED ORDER — ASPIRIN 81 MG PO TBEC
81.0000 mg | DELAYED_RELEASE_TABLET | Freq: Every day | ORAL | Status: DC
  Administered 2023-03-21 – 2023-03-22 (×2): 81 mg via ORAL
  Filled 2023-03-21 (×2): qty 1

## 2023-03-21 MED ORDER — ROSUVASTATIN CALCIUM 20 MG PO TABS
40.0000 mg | ORAL_TABLET | Freq: Every day | ORAL | Status: DC
  Administered 2023-03-21 – 2023-03-23 (×3): 40 mg via ORAL
  Filled 2023-03-21 (×3): qty 2

## 2023-03-21 MED ORDER — INSULIN ASPART 100 UNIT/ML IJ SOLN
0.0000 [IU] | Freq: Three times a day (TID) | INTRAMUSCULAR | Status: DC
  Administered 2023-03-21 – 2023-03-22 (×4): 1 [IU] via SUBCUTANEOUS
  Administered 2023-03-22: 2 [IU] via SUBCUTANEOUS
  Administered 2023-03-23: 1 [IU] via SUBCUTANEOUS

## 2023-03-21 MED ORDER — ENOXAPARIN SODIUM 40 MG/0.4ML IJ SOSY
40.0000 mg | PREFILLED_SYRINGE | INTRAMUSCULAR | Status: DC
  Administered 2023-03-21 – 2023-03-22 (×2): 40 mg via SUBCUTANEOUS
  Filled 2023-03-21 (×2): qty 0.4

## 2023-03-21 NOTE — Consult Note (Signed)
Cardiology Consultation:   Patient ID: Raymond Harrell MRN: [id number]; DOB: [birth year]  Admit date: 03/20/2023 Date of Consult: 03/21/2023  Primary Care Provider: Gaspar Garbe, MD Primary Cardiologist: None  Primary Electrophysiologist:  None    Patient Profile:   Raymond Harrell is a 57 y.o. male with a hx of type 2 diabetes, hypertension, dyslipidemia and family history of premature heart disease who is being seen today for the evaluation of chest pain at the request of ED.  History of Present Illness:   [name] is a pleasant 57 year old gentleman with a history of diabetes and hypertension and dyslipidemia who presents with new onset chest pain.  He reports that he had been doing well until about Wednesday when he noticed he was having episodes of chest discomfort that would come on and off that were stabbing in nature that started in the back and would radiate into his chest.  This went on for 2 days until yesterday when he had constant symptoms and brought himself to the ED.  In the ED was found to have mildly elevated troponin but it was downtrending.  Rest of the lab work was unremarkable blood pressure was also elevated on presentation but is improved.  Past Medical History:  Diagnosis Date   Abnormal CT scan    a. CT angio incidentally noted nodular opacity in upper abdomen felt lymph node vs part of pancreas, felt to be benign lesion with stable appearance.   Chest pain    a. Negative nuc 2010 (abnormal baseline EKG). b. Adm with pleuritic CP 02/2012: negative cardiac enzymes, CTA negative for PE  but did show bronchiectasis/scarring RUL.   Diabetes mellitus (HCC)    Diagnosed 02/2012-- 8/20/2020not on medication any longer-   Elevated blood pressure    Noted 02/2012   Elevated LDL cholesterol level    Noted 02/2012   History of kidney stones    Passed   Hypertension     Past Surgical History:  Procedure Laterality Date   HAND TENDON SURGERY  Right    LAPAROSCOPIC APPENDECTOMY N/A 01/28/2019   Procedure: LAPAROSCOPIC APPENDECTOMY;  Surgeon: Harriette Bouillon, MD;  Location: [facility] OR;  Service: General;  Laterality: N/A;     Home Medications:  Prior to Admission medications   Medication Sig Start Date End Date Taking? Authorizing Provider  acetaminophen (TYLENOL) 500 MG tablet Take 1,000 mg by mouth every 6 (six) hours as needed (for pain.).   Yes [provider]  aspirin 81 MG EC tablet Take 81 mg by mouth daily.    Yes [provider]  Garlic 1000 MG CAPS Take 1,000 mg by mouth daily.   Yes [provider]  lisinopril (PRINIVIL,ZESTRIL) 20 MG tablet Take 1 tablet (20 mg total) by mouth daily. 12/09/17  Yes Mancel Bale, MD  metFORMIN (GLUCOPHAGE) 500 MG tablet Take 500 mg by mouth 2 (two) times daily. 09/23/20  Yes [provider]  Multiple Vitamin (MULTIVITAMIN WITH MINERALS) TABS tablet Take 1 tablet by mouth daily.   Yes [provider]  Omega-3 Fatty Acids (FISH OIL) 500 MG CAPS Take 500 mg by mouth daily.   Yes [provider]  rosuvastatin (CRESTOR) 40 MG tablet Take 40 mg by mouth daily.   Yes [provider]  vitamin C (ASCORBIC ACID) 500 MG tablet Take 500 mg by mouth daily as needed.   Yes [provider]    Inpatient Medications: Scheduled Meds:  aspirin EC  81 mg Oral Daily  enoxaparin (LOVENOX) injection  40 mg Subcutaneous Q24H   insulin aspart  0-5 Units Subcutaneous QHS   insulin aspart  0-9 Units Subcutaneous TID WC   lisinopril  20 mg Oral Daily   rosuvastatin  40 mg Oral Daily   Continuous Infusions:  PRN Meds: acetaminophen **OR** acetaminophen  Allergies:    Allergies  Allergen Reactions   Other Shortness Of Breath, Swelling and Other (See Comments)    Spider venom = Dizziness, tight throat, bruising, and swelling- Required HOSPITALIZATION in 2022    Social History:   Social History   Socioeconomic History   Marital status:  Married    Spouse name: Not on file   Number of children: Not on file   Years of education: Not on file   Highest education level: Not on file  Occupational History   Not on file  Tobacco Use   Smoking status: Former    Current packs/day: 0.00    Types: Cigarettes    Start date: 29    Quit date: 2005    Years since quitting: 19.7   Smokeless tobacco: Never  Vaping Use   Vaping status: Never Used  Substance and Sexual Activity   Alcohol use: Yes    Alcohol/week: 27.0 standard drinks of alcohol    Types: 27 Cans of beer per week   Drug use: No   Sexual activity: Not on file  Other Topics Concern   Not on file  Social History Narrative   Not on file   Social Determinants of Health   Financial Resource Strain: Not on file  Food Insecurity: Not on file  Transportation Needs: Not on file  Physical Activity: Not on file  Stress: Not on file  Social Connections: Not on file  Intimate Partner Violence: Not on file    Family History:    Family History  Problem Relation Age of Onset   Heart attack Father        MI in his 70's      Review of Systems: [y] = yes, [ ]  = no   General: Weight gain [ ] ; Weight loss [ ] ; Anorexia [ ] ; Fatigue [ ] ; Fever [ ] ; Chills [ ] ; Weakness [ ]   Cardiac: Chest pain/pressure [ ] ; Resting SOB [ ] ; Exertional SOB [ ] ; Orthopnea [ ] ; Pedal Edema [ ] ; Palpitations [ ] ; Syncope [ ] ; Presyncope [ ] ; Paroxysmal nocturnal dyspnea[ ]   Pulmonary: Cough [ ] ; Wheezing[ ] ; Hemoptysis[ ] ; Sputum [ ] ; Snoring [ ]   GI: Vomiting[ ] ; Dysphagia[ ] ; Melena[ ] ; Hematochezia [ ] ; Heartburn[ ] ; Abdominal pain [ ] ; Constipation [ ] ; Diarrhea [ ] ; BRBPR [ ]   GU: Hematuria[ ] ; Dysuria [ ] ; Nocturia[ ]   Vascular: Pain in legs with walking [ ] ; Pain in feet with lying flat [ ] ; Non-healing sores [ ] ; Stroke [ ] ; TIA [ ] ; Slurred speech [ ] ;  Neuro: Headaches[ ] ; Vertigo[ ] ; Seizures[ ] ; Paresthesias[ ] ;Blurred vision [ ] ; Diplopia [ ] ; Vision changes [ ]   Ortho/Skin:  Arthritis [ ] ; Joint pain [ ] ; Muscle pain [ ] ; Joint swelling [ ] ; Back Pain [ ] ; Rash [ ]   Psych: Depression[ ] ; Anxiety[ ]   Heme: Bleeding problems [ ] ; Clotting disorders [ ] ; Anemia [ ]   Endocrine: Diabetes [ ] ; Thyroid dysfunction[ ]   Physical Exam/Data:   Vitals:   03/21/23 0300 03/21/23 0349 03/21/23 0500 03/21/23 0600  BP: (!) 145/73  (!) 149/97 128/82  Pulse: 90  91 82  Resp: 18  17 17   Temp:  98.3 F (36.8 C)    TempSrc:      SpO2: 96%  93% 97%  Weight:      Height:       No intake or output data in the 24 hours ending 03/21/23 0701 Filed Weights   03/20/23 1657  Weight: 118.1 kg   Body mass index is 31.69 kg/m.  General:  Well nourished, well developed, in no acute distress HEENT: normal Lymph: no adenopathy Neck: no JVD Endocrine:  No thryomegaly Vascular: No carotid bruits; FA pulses 2+ bilaterally without bruits  Cardiac:  normal S1, S2; RRR; no murmur  Lungs:  clear to auscultation bilaterally, no wheezing, rhonchi or rales  Abd: soft, nontender, no hepatomegaly  Ext: no edema Musculoskeletal:  No deformities, BUE and BLE strength normal and equal Skin: warm and dry  Neuro:  CNs 2-12 intact, no focal abnormalities noted Psych:  Normal affect   EKG:  The EKG was personally reviewed and demonstrates: Sinus rhythm with lateral T wave inversion Telemetry:  Telemetry was personally reviewed and demonstrates: This  Relevant CV Studies: Echo 2013: Left ventricle: The cavity size was at the upper limits of    normal. Wall thickness was increased increased in a    pattern of mild to moderate LVH. Systolic function was    normal. The estimated ejection fraction was in the range    of 55% to 60%. Wall motion was normal; there were no    regional wall motion abnormalities. The study is not    technically sufficient to allow evaluation of LV diastolic    function.  - Left atrium: The atrium was mildly dilated.   Laboratory Data: Troponin  23->20 Chemistry Recent Labs  Lab 03/20/23 1726  NA 134*  K 3.8  CL 99  CO2 21*  GLUCOSE 106*  BUN 17  CREATININE 0.93  CALCIUM 9.7  GFRNONAA >60  ANIONGAP 14    No results for input(s): "PROT", "ALBUMIN", "AST", "ALT", "ALKPHOS", "BILITOT" in the last 168 hours. Hematology Recent Labs  Lab 03/20/23 1726  WBC 6.6  RBC 5.16  HGB 13.3  HCT 42.0  MCV 81.4  MCH 25.8*  MCHC 31.7  RDW 14.9  PLT 215   Cardiac EnzymesNo results for input(s): "TROPONINI" in the last 168 hours. No results for input(s): "TROPIPOC" in the last 168 hours.  BNPNo results for input(s): "BNP", "PROBNP" in the last 168 hours.  DDimer No results for input(s): "DDIMER" in the last 168 hours.  Radiology/Studies:  CT Angio Chest/Abd/Pel for Dissection W and/or W/WO  Result Date: 03/20/2023 CLINICAL DATA:  Chest pain since Wednesday. Aortic aneurysm suspected EXAM: CT ANGIOGRAPHY CHEST, ABDOMEN AND PELVIS TECHNIQUE: Non-contrast CT of the chest was initially obtained. Multidetector CT imaging through the chest, abdomen and pelvis was performed using the standard protocol during bolus administration of intravenous contrast. Multiplanar reconstructed images and MIPs were obtained and reviewed to evaluate the vascular anatomy. RADIATION DOSE REDUCTION: This exam was performed according to the departmental dose-optimization program which includes automated exposure control, adjustment of the mA and/or kV according to patient size and/or use of iterative reconstruction technique. CONTRAST:  OMNIPAQUE IOHEXOL 350 MG/ML SOLN COMPARISON:  Chest radiographs earlier today; CT abdomen and pelvis 12/19/2018 and CTA chest 02/18/2012 FINDINGS: CTA CHEST FINDINGS Cardiovascular: No acute aortic syndrome. Normal heart size. No pericardial effusion. Mild coronary artery and aortic atherosclerotic calcification. Mediastinum/Nodes: Trachea and esophagus are unremarkable. No thoracic adenopathy. Lungs/Pleura: Scarring,  consolidation, and bronchiectasis in the right upper lobe stable since 2013. The lungs are otherwise clear. No pleural effusion or pneumothorax. Musculoskeletal: No acute fracture. Review of the MIP images confirms the above findings. CTA ABDOMEN AND PELVIS FINDINGS VASCULAR Normal caliber abdominal aorta without aneurysm or dissection. Atherosclerotic plaque. The mesenteric and renal arteries are widely patent without aneurysm or dissection. Patent inflow and outflow arteries without aneurysm or dissection. Review of the MIP images confirms the above findings. NON-VASCULAR Hepatobiliary: Unremarkable liver. Normal gallbladder. No biliary dilation. Pancreas: Unremarkable. Spleen: Unremarkable. Adrenals/Urinary Tract: Normal adrenal glands. No urinary calculi or hydronephrosis. Bladder is unremarkable. Stomach/Bowel: Normal caliber large and small bowel. No bowel wall thickening. Appendectomy.Stomach is within normal limits. Vascular/Lymphatic: No significant vascular findings are present. No enlarged abdominal or pelvic lymph nodes. Reproductive: Unremarkable. Other: No free intraperitoneal fluid or air. Musculoskeletal: No acute fracture. Review of the MIP images confirms the above findings. IMPRESSION: 1. No acute aortic syndrome. 2. No acute abnormality in the chest, abdomen, or pelvis. Aortic Atherosclerosis (ICD10-I70.0). Electronically Signed   By: Minerva Fester M.D.   On: 03/20/2023 23:35   DG Chest 2 View  Result Date: 03/20/2023 CLINICAL DATA:  Chest pain EXAM: CHEST - 2 VIEW COMPARISON:  12/19/2018 FINDINGS: Right apical scarring, stable. Lungs otherwise clear. No effusions. Heart and mediastinal contours within normal limits. No acute bony abnormality. IMPRESSION: No active cardiopulmonary disease. Electronically Signed   By: Charlett Nose M.D.   On: 03/20/2023 19:12    Assessment and Plan:   Unstable angina Elevated troponin Subclinical coronary disease CT chest 03/19/21: Mild coronary  artery and aortic atherosclerotic calcification Hypertension Dyslipidemia LDL 158 back in 2013 Family history of premature heart disease   Plan: -Will proceed with ischemic evaluation with CT coronary.  If not available then would complete exercise nuclear stress. -Echocardiogram -Reasonable for discharge if above testing is unremarkable with outpatient cardiology follow-up..     For questions or updates, please contact [facility] Please consult [email or web address] for contact info under     Signed, Macie Burows, MD  03/21/2023 7:01 AM

## 2023-03-21 NOTE — Progress Notes (Signed)
Echocardiogram 2D Echocardiogram has been performed.  Warren Lacy Rimas Gilham RDCS 03/21/2023, 8:25 AM

## 2023-03-21 NOTE — Assessment & Plan Note (Addendum)
A1C 7.1%. only on metformin. Trying to control his CBG with diet and exercise. Will need f/u with PCP to discuss further intervention.  On SSI.

## 2023-03-21 NOTE — Progress Notes (Addendum)
PROGRESS NOTE    Raymond Harrell  QMV:[id number] DOB: [birth year] DOA: 03/20/2023 PCP: Gaspar Garbe, MD  Subjective: Pt seen and examined. No further CP. Cardiology consult and progress note reviewed. Greatly appreciated!  Plan for [facility] on Monday.   Hospital Course: HPI: Raymond Harrell is a 57 y.o. male with medical history significant of hypertension, type 2 diabetes, hyperlipidemia presented to ED with complaint of right-sided chest pain radiating to his back x 3 days.  EKG without acute ischemic changes.  Troponin 23> 20.  CTA chest/abdomen/pelvis negative for aortic dissection. EDP discussed with on-call cardiologist Dr. Aron Baba who recommended admission for observation since patient has risk factors for CAD and mild coronary artery calcification seen on CT. cardiology has ordered coronary CTA.  Patient was given morphine and sublingual nitroglycerin.  TRH called to admit.   Patient is reporting 3-day history of intermittent sharp right-sided back pain which radiates to his chest.  No associated dyspnea, diaphoresis, nausea, or vomiting.  He denies history of CAD or blood clots in the past.  Denies any injuries to his back or chest.  Denies lifting any heavy weights.  His chest pain has resolved after he received morphine and sublingual nitroglycerin in the ED. No other complaints.  Significant Events: Admitted 03/20/2023 for chest pain   Significant Labs: Troponin 20->23 TC 138, TG 82, HDL 61, LDL 61 A1C 7.1%  Significant Imaging Studies: CTA chest/abd shows 1. No acute aortic syndrome. 2. No acute abnormality in the chest, abdomen, or pelvis.  Antibiotic Therapy: Anti-infectives (From admission, onward)    None       Procedures:   Consultants: cardiology    Assessment and Plan: * Chest pain Admitted for chest pain. Troponin 20->23. Pt seen by cardiology. Echo shows no wall motion abnormality.   EKG shows new TWI in V4-V6 that was not present in  01-2021    Type 2 diabetes mellitus without complication, without long-term current use of insulin (HCC) A1C 7.1%. only on metformin. Trying to control his CBG with diet and exercise. Will need f/u with PCP to discuss further intervention.  On SSI.  Dyslipidemia Remains on crestor 40 mg every day.  Unstable angina [facility]) Troponin 20->23  Cardiology planning on [facility] on monday  Essential hypertension Continue lisinopril 20 mg daily.   DVT prophylaxis: enoxaparin (LOVENOX) injection 40 mg Start: 03/21/23 1000    Code Status: Full Code Family Communication: no family at bedside. Pt is decisional Disposition Plan: return home Reason for continuing need for hospitalization: cardiology planning on [facility] on Monday  Objective: Vitals:   03/21/23 0830 03/21/23 0936 03/21/23 1100 03/21/23 1200  BP:  131/73 (!) 147/82 132/78  Pulse:  74 81 76  Resp:  14 18 (!) 9  Temp: 98.3 F (36.8 C) 98.5 F (36.9 C)    TempSrc:  Oral    SpO2:  98% 94% 95%  Weight:      Height:       No intake or output data in the 24 hours ending 03/21/23 1341 Filed Weights   03/20/23 1657  Weight: 118.1 kg    Examination:  Physical Exam Vitals and nursing note reviewed.  Constitutional:      General: He is not in acute distress.    Appearance: He is not toxic-appearing or diaphoretic.  HENT:     Head: Normocephalic and atraumatic.     Nose: Nose normal.  Eyes:     General: No scleral icterus. Cardiovascular:  PROGRESS NOTE    Raymond Harrell  QMV:[id number] DOB: [birth year] DOA: 03/20/2023 PCP: Gaspar Garbe, MD  Subjective: Pt seen and examined. No further CP. Cardiology consult and progress note reviewed. Greatly appreciated!  Plan for [facility] on Monday.   Hospital Course: HPI: Raymond Harrell is a 57 y.o. male with medical history significant of hypertension, type 2 diabetes, hyperlipidemia presented to ED with complaint of right-sided chest pain radiating to his back x 3 days.  EKG without acute ischemic changes.  Troponin 23> 20.  CTA chest/abdomen/pelvis negative for aortic dissection. EDP discussed with on-call cardiologist Dr. Aron Baba who recommended admission for observation since patient has risk factors for CAD and mild coronary artery calcification seen on CT. cardiology has ordered coronary CTA.  Patient was given morphine and sublingual nitroglycerin.  TRH called to admit.   Patient is reporting 3-day history of intermittent sharp right-sided back pain which radiates to his chest.  No associated dyspnea, diaphoresis, nausea, or vomiting.  He denies history of CAD or blood clots in the past.  Denies any injuries to his back or chest.  Denies lifting any heavy weights.  His chest pain has resolved after he received morphine and sublingual nitroglycerin in the ED. No other complaints.  Significant Events: Admitted 03/20/2023 for chest pain   Significant Labs: Troponin 20->23 TC 138, TG 82, HDL 61, LDL 61 A1C 7.1%  Significant Imaging Studies: CTA chest/abd shows 1. No acute aortic syndrome. 2. No acute abnormality in the chest, abdomen, or pelvis.  Antibiotic Therapy: Anti-infectives (From admission, onward)    None       Procedures:   Consultants: cardiology    Assessment and Plan: * Chest pain Admitted for chest pain. Troponin 20->23. Pt seen by cardiology. Echo shows no wall motion abnormality.   EKG shows new TWI in V4-V6 that was not present in  01-2021    Type 2 diabetes mellitus without complication, without long-term current use of insulin (HCC) A1C 7.1%. only on metformin. Trying to control his CBG with diet and exercise. Will need f/u with PCP to discuss further intervention.  On SSI.  Dyslipidemia Remains on crestor 40 mg every day.  Unstable angina [facility]) Troponin 20->23  Cardiology planning on [facility] on monday  Essential hypertension Continue lisinopril 20 mg daily.   DVT prophylaxis: enoxaparin (LOVENOX) injection 40 mg Start: 03/21/23 1000    Code Status: Full Code Family Communication: no family at bedside. Pt is decisional Disposition Plan: return home Reason for continuing need for hospitalization: cardiology planning on [facility] on Monday  Objective: Vitals:   03/21/23 0830 03/21/23 0936 03/21/23 1100 03/21/23 1200  BP:  131/73 (!) 147/82 132/78  Pulse:  74 81 76  Resp:  14 18 (!) 9  Temp: 98.3 F (36.8 C) 98.5 F (36.9 C)    TempSrc:  Oral    SpO2:  98% 94% 95%  Weight:      Height:       No intake or output data in the 24 hours ending 03/21/23 1341 Filed Weights   03/20/23 1657  Weight: 118.1 kg    Examination:  Physical Exam Vitals and nursing note reviewed.  Constitutional:      General: He is not in acute distress.    Appearance: He is not toxic-appearing or diaphoretic.  HENT:     Head: Normocephalic and atraumatic.     Nose: Nose normal.  Eyes:     General: No scleral icterus. Cardiovascular:  Rate and Rhythm: Normal rate and regular rhythm.     Pulses: Normal pulses.  Pulmonary:     Effort: Pulmonary effort is normal.     Breath sounds: Normal breath sounds.  Abdominal:     General: Bowel sounds are normal.     Palpations: Abdomen is soft.  Musculoskeletal:     Right lower leg: No edema.     Left lower leg: No edema.  Skin:    General: Skin is warm and dry.     Capillary Refill: Capillary refill takes less than 2 seconds.  Neurological:     General: No focal deficit  present.     Mental Status: He is alert and oriented to person, place, and time.    Data Reviewed: I have personally reviewed following labs and imaging studies  CBC: Recent Labs  Lab 03/20/23 1726  WBC 6.6  HGB 13.3  HCT 42.0  MCV 81.4  PLT 215   Basic Metabolic Panel: Recent Labs  Lab 03/20/23 1726  NA 134*  K 3.8  CL 99  CO2 21*  GLUCOSE 106*  BUN 17  CREATININE 0.93  CALCIUM 9.7   GFR: Estimated Creatinine Clearance: 123.1 mL/min (by C-G formula based on SCr of 0.93 mg/dL).  HbA1C: Recent Labs    03/21/23 0839  HGBA1C 7.1*   CBG: Recent Labs  Lab 03/21/23 0826 03/21/23 1126 03/21/23 1251  GLUCAP 127* 165* 139*   Lipid Profile: Recent Labs    03/21/23 0839  CHOL 138  HDL 61  LDLCALC 61  TRIG 82  CHOLHDL 2.3    Radiology Studies: ECHOCARDIOGRAM COMPLETE  Result Date: 03/21/2023    ECHOCARDIOGRAM REPORT   Patient Name:   Raymond Harrell Date of Exam: 03/21/2023 Medical Rec #:  [id number]             Height:       76.0 in Accession #:    [id number]            Weight:       260.4 lb Date of Birth:  05-13-1966             BSA:          2.478 m Patient Age:    57 years              BP:           132/83 mmHg Patient Gender: M                     HR:           75 bpm. Exam Location:  Inpatient Procedure: 2D Echo, Color Doppler, Cardiac Doppler and Intracardiac            Opacification Agent Indications:    R07.9* Chest pain, unspecified  History:        Patient has prior history of Echocardiogram examinations, most                 recent 02/18/2012. CAD; Risk Factors:Hypertension, Dyslipidemia                 and Diabetes.  Sonographer:    Irving Burton Senior RDCS Referring Phys: [id number] Kathryne Sharper A WILMOT IMPRESSIONS  1. Left ventricular ejection fraction, by estimation, is 55%. The left ventricle has normal function. The left ventricle has no regional wall motion abnormalities. There is moderate asymmetric left ventricular hypertrophy of the septal segment. Left  ventricular diastolic parameters are consistent with  Rate and Rhythm: Normal rate and regular rhythm.     Pulses: Normal pulses.  Pulmonary:     Effort: Pulmonary effort is normal.     Breath sounds: Normal breath sounds.  Abdominal:     General: Bowel sounds are normal.     Palpations: Abdomen is soft.  Musculoskeletal:     Right lower leg: No edema.     Left lower leg: No edema.  Skin:    General: Skin is warm and dry.     Capillary Refill: Capillary refill takes less than 2 seconds.  Neurological:     General: No focal deficit  present.     Mental Status: He is alert and oriented to person, place, and time.    Data Reviewed: I have personally reviewed following labs and imaging studies  CBC: Recent Labs  Lab 03/20/23 1726  WBC 6.6  HGB 13.3  HCT 42.0  MCV 81.4  PLT 215   Basic Metabolic Panel: Recent Labs  Lab 03/20/23 1726  NA 134*  K 3.8  CL 99  CO2 21*  GLUCOSE 106*  BUN 17  CREATININE 0.93  CALCIUM 9.7   GFR: Estimated Creatinine Clearance: 123.1 mL/min (by C-G formula based on SCr of 0.93 mg/dL).  HbA1C: Recent Labs    03/21/23 0839  HGBA1C 7.1*   CBG: Recent Labs  Lab 03/21/23 0826 03/21/23 1126 03/21/23 1251  GLUCAP 127* 165* 139*   Lipid Profile: Recent Labs    03/21/23 0839  CHOL 138  HDL 61  LDLCALC 61  TRIG 82  CHOLHDL 2.3    Radiology Studies: ECHOCARDIOGRAM COMPLETE  Result Date: 03/21/2023    ECHOCARDIOGRAM REPORT   Patient Name:   Raymond Harrell Date of Exam: 03/21/2023 Medical Rec #:  [id number]             Height:       76.0 in Accession #:    [id number]            Weight:       260.4 lb Date of Birth:  05-13-1966             BSA:          2.478 m Patient Age:    57 years              BP:           132/83 mmHg Patient Gender: M                     HR:           75 bpm. Exam Location:  Inpatient Procedure: 2D Echo, Color Doppler, Cardiac Doppler and Intracardiac            Opacification Agent Indications:    R07.9* Chest pain, unspecified  History:        Patient has prior history of Echocardiogram examinations, most                 recent 02/18/2012. CAD; Risk Factors:Hypertension, Dyslipidemia                 and Diabetes.  Sonographer:    Irving Burton Senior RDCS Referring Phys: [id number] Kathryne Sharper A WILMOT IMPRESSIONS  1. Left ventricular ejection fraction, by estimation, is 55%. The left ventricle has normal function. The left ventricle has no regional wall motion abnormalities. There is moderate asymmetric left ventricular hypertrophy of the septal segment. Left  ventricular diastolic parameters are consistent with

## 2023-03-21 NOTE — Plan of Care (Signed)
  Problem: Education: Goal: Ability to describe self-care measures that may prevent or decrease complications (Diabetes Survival Skills Education) will improve Outcome: Progressing Goal: Individualized Educational Video(s) Outcome: Progressing   Problem: Coping: Goal: Ability to adjust to condition or change in health will improve Outcome: Progressing   Problem: Fluid Volume: Goal: Ability to maintain a balanced intake and output will improve Outcome: Progressing   Problem: Health Behavior/Discharge Planning: Goal: Ability to identify and utilize available resources and services will improve Outcome: Progressing Goal: Ability to manage health-related needs will improve Outcome: Progressing   Problem: Metabolic: Goal: Ability to maintain appropriate glucose levels will improve Outcome: Progressing   Problem: Nutritional: Goal: Maintenance of adequate nutrition will improve Outcome: Progressing Goal: Progress toward achieving an optimal weight will improve Outcome: Progressing   Problem: Skin Integrity: Goal: Risk for impaired skin integrity will decrease Outcome: Progressing   Problem: Education: Goal: Knowledge of General Education information will improve Description: Including pain rating scale, medication(s)/side effects and non-pharmacologic comfort measures Outcome: Progressing   Problem: Health Behavior/Discharge Planning: Goal: Ability to manage health-related needs will improve Outcome: Progressing   Problem: Clinical Measurements: Goal: Ability to maintain clinical measurements within normal limits will improve Outcome: Progressing Goal: Will remain free from infection Outcome: Progressing Goal: Diagnostic test results will improve Outcome: Progressing Goal: Respiratory complications will improve Outcome: Progressing Goal: Cardiovascular complication will be avoided Outcome: Progressing   Problem: Activity: Goal: Risk for activity intolerance will  decrease Outcome: Progressing   Problem: Nutrition: Goal: Adequate nutrition will be maintained Outcome: Progressing   Problem: Elimination: Goal: Will not experience complications related to bowel motility Outcome: Progressing Goal: Will not experience complications related to urinary retention Outcome: Progressing   Problem: Pain Managment: Goal: General experience of comfort will improve Outcome: Progressing   Problem: Safety: Goal: Ability to remain free from injury will improve Outcome: Progressing   Problem: Skin Integrity: Goal: Risk for impaired skin integrity will decrease Outcome: Progressing

## 2023-03-21 NOTE — Hospital Course (Signed)
HPI: Raymond Harrell is a 57 y.o. male with medical history significant of hypertension, type 2 diabetes, hyperlipidemia presented to ED with complaint of right-sided chest pain radiating to his back x 3 days.  EKG without acute ischemic changes.  Troponin 23> 20.  CTA chest/abdomen/pelvis negative for aortic dissection. EDP discussed with on-call cardiologist Dr. Aron Baba who recommended admission for observation since patient has risk factors for CAD and mild coronary artery calcification seen on CT. cardiology has ordered coronary CTA.  Patient was given morphine and sublingual nitroglycerin.  TRH called to admit.   Patient is reporting 3-day history of intermittent sharp right-sided back pain which radiates to his chest.  No associated dyspnea, diaphoresis, nausea, or vomiting.  He denies history of CAD or blood clots in the past.  Denies any injuries to his back or chest.  Denies lifting any heavy weights.  His chest pain has resolved after he received morphine and sublingual nitroglycerin in the ED. No other complaints.  Significant Events: Admitted 03/20/2023 for chest pain 03-23-2023 LHC showed 1st Diag lesion is 30% stenosed.   Significant Labs: Troponin 20->23 TC 138, TG 82, HDL 61, LDL 61 A1C 7.1%  Significant Imaging Studies: CTA chest/abd shows 1. No acute aortic syndrome. 2. No acute abnormality in the chest, abdomen, or pelvis. 03-21-2023 echo shows LVEF normal at 55%. No wall motion abnormalities. Moderate asymmetric LVH.  Antibiotic Therapy: Anti-infectives (From admission, onward)    None       Procedures: 03-23-2023 LHC showed 1st Diag lesion is 30% stenosed.   Consultants: cardiology

## 2023-03-21 NOTE — Subjective & Objective (Signed)
Pt seen and examined. No further CP. Cardiology consult and progress note reviewed. Greatly appreciated!  Plan for [facility] on Monday.

## 2023-03-21 NOTE — Assessment & Plan Note (Signed)
Remains on crestor 40 mg every day.

## 2023-03-21 NOTE — Assessment & Plan Note (Signed)
Continue lisinopril 20 mg daily

## 2023-03-21 NOTE — Progress Notes (Addendum)
   Patient Name: Raymond Harrell Date of Encounter: 03/21/2023 [facility] Cardiologist: None   Interval Summary  .    No additional chest pain after nitroglycerin sl.  BP elevated, will control better to avoid recurrent angina D/w wife, daughter by facetime and sons present in room Echo shows LVH, ef 55% and grade 1 ddx all indicative of lognstanding HTN. No RWMA in setting of chest pain.   Vital Signs .    Vitals:   03/21/23 0500 03/21/23 0600 03/21/23 0700 03/21/23 0830  BP: (!) 149/97 128/82 132/83   Pulse: 91 82 71   Resp: 17 17 10    Temp:    98.3 F (36.8 C)  TempSrc:      SpO2: 93% 97% 96%   Weight:      Height:       No intake or output data in the 24 hours ending 03/21/23 0908    03/20/2023    4:57 PM 01/07/2021    3:11 PM 01/27/2019   12:23 PM  Last 3 Weights  Weight (lbs) 260 lb 5.8 oz 260 lb 6.4 oz   Weight (kg) 118.1 kg 118.117 kg      Information is confidential and restricted. Go to Review Flowsheets to unlock data.      Telemetry/ECG    SR rare PVCs - Personally Reviewed  Physical Exam .   GEN: No acute distress.   Neck: No JVD Cardiac: RRR, no murmurs, rubs, or gallops.  Respiratory: Clear to auscultation bilaterally. GI: Soft, nontender, non-distended  MS: No edema  Assessment & Plan .     Principal Problem:   Chest pain Active Problems:   Unstable angina (HCC)   Coronary artery disease involving coronary bypass graft of native heart with unstable angina pectoris (HCC)   Dyslipidemia   Type 2 diabetes mellitus without complication, without long-term current use of insulin (HCC)  CP - mild troponin elevation and mild increase in T wave abnormality laterally. With diabetes and HbA1C 7.1, cp radiating from back to chest, RCA calcifications on recent CT, and no ability to get noninvasive stress imaging over weekend, would recommend cardiac catheterization on Monday. Pt and family agreeable and will make decision tomorrow.  -  findings could all be related to BP if no obstructive CAD, will add imdur for dual benefit of cp and HTN and evaluate tomorrow.  INFORMED CONSENT: I have reviewed the risks, indications, and alternatives to cardiac catheterization, possible angioplasty, and stenting with the patient. Risks include but are not limited to bleeding, infection, vascular injury, stroke, myocardial infarction, arrhythmia, kidney injury, radiation-related injury in the case of prolonged fluoroscopy use, emergency cardiac surgery, and death. The patient understands the risks of serious complication is 1-2 in 1000 with diagnostic cardiac cath and 1-2% or less with angioplasty/stenting.     For questions or updates, please contact [facility] Please consult [email or web address] for contact info under        Signed, Parke Poisson, MD

## 2023-03-21 NOTE — ED Notes (Signed)
Pt transported to Echo.

## 2023-03-21 NOTE — ED Notes (Addendum)
ED TO INPATIENT HANDOFF REPORT  ED Nurse Name and Phone #: Steward Drone [phone]    S Name/Age/Gender Raymond Harrell 57 y.o. male Room/Bed: 006C/006C  Code Status   Code Status: Full Code  Home/SNF/Other Home Patient oriented to: self, place, time, and situation Is this baseline? Yes   Triage Complete: Triage complete  Chief Complaint Chest pain [R07.9]  Triage Note The pt is c/o chest pain since Wednesday  no sob or nausea    Allergies Allergies  Allergen Reactions   Other Shortness Of Breath, Swelling and Other (See Comments)    Spider venom = Dizziness, tight throat, bruising, and swelling- Required HOSPITALIZATION in 2022    Level of Care/Admitting Diagnosis ED Disposition     ED Disposition  Admit   Condition  --   Comment  Hospital Area: [facility] [[id number]]  Level of Care: Telemetry Cardiac [[id number]]  May place patient in observation at [facility] or [facility] if equivalent level of care is available:: Yes  Covid Evaluation: Asymptomatic - no recent exposure (last 10 days) testing not required  Diagnosis: Chest pain [259563]  Admitting Physician: John Giovanni [[id number]]  Attending Physician: John Giovanni [[id number]]          B Medical/Surgery History Past Medical History:  Diagnosis Date   Abnormal CT scan    a. CT angio incidentally noted nodular opacity in upper abdomen felt lymph node vs part of pancreas, felt to be benign lesion with stable appearance.   Chest pain    a. Negative nuc 2010 (abnormal baseline EKG). b. Adm with pleuritic CP 02/2012: negative cardiac enzymes, CTA negative for PE  but did show bronchiectasis/scarring RUL.   Diabetes mellitus (HCC)    Diagnosed 02/2012-- 8/20/2020not on medication any longer-   Elevated blood pressure    Noted 02/2012   Elevated LDL cholesterol level    Noted 02/2012   History of kidney stones    Passed   Hypertension    Past Surgical History:  Procedure Laterality Date    HAND TENDON SURGERY Right    LAPAROSCOPIC APPENDECTOMY N/A 01/28/2019   Procedure: LAPAROSCOPIC APPENDECTOMY;  Surgeon: Harriette Bouillon, MD;  Location: [facility] OR;  Service: General;  Laterality: N/A;     A IV Location/Drains/Wounds Patient Lines/Drains/Airways Status     Active Line/Drains/Airways     Name Placement date Placement time Site Days   Peripheral IV 03/20/23 20 G Left Forearm 03/20/23  1945  Forearm  1   Incision (Closed) 01/28/19 Abdomen Other (Comment) 01/28/19  0806  -- 1513   Incision - 3 Ports Umbilicus Right;Upper Left;Lateral;Lower 01/28/19  0800  -- 1513   Wound / Incision (Open or Dehisced) 01/07/21 Shoulder Posterior;Right suspected spider bite eschar/wheeping 01/07/21  0632  Shoulder  803            Intake/Output Last 24 hours No intake or output data in the 24 hours ending 03/21/23 1411  Labs/Imaging Results for orders placed or performed during the hospital encounter of 03/20/23 (from the past 48 hour(s))  Basic metabolic panel     Status: Abnormal   Collection Time: 03/20/23  5:26 PM  Result Value Ref Range   Sodium 134 (L) 135 - 145 mmol/L   Potassium 3.8 3.5 - 5.1 mmol/L   Chloride 99 98 - 111 mmol/L   CO2 21 (L) 22 - 32 mmol/L   Glucose, Bld 106 (H) 70 - 99 mg/dL    Comment: Glucose reference range applies only to samples  taken after fasting for at least 8 hours.   BUN 17 6 - 20 mg/dL   Creatinine, Ser 9.52 0.61 - 1.24 mg/dL   Calcium 9.7 8.9 - 84.1 mg/dL   GFR, Estimated >32 >44 mL/min    Comment: (NOTE) Calculated using the CKD-EPI Creatinine Equation (2021)    Anion gap 14 5 - 15    Comment: Performed at [facility], [address] [place], [place] [address]  CBC     Status: Abnormal   Collection Time: 03/20/23  5:26 PM  Result Value Ref Range   WBC 6.6 4.0 - 10.5 K/uL   RBC 5.16 4.22 - 5.81 MIL/uL   Hemoglobin 13.3 13.0 - 17.0 g/dL   HCT 25.3 66.4 - 40.3 %   MCV 81.4 80.0 - 100.0 fL   MCH 25.8 (L) 26.0 - 34.0 pg   MCHC 31.7  30.0 - 36.0 g/dL   RDW 47.4 25.9 - 56.3 %   Platelets 215 150 - 400 K/uL   nRBC 0.0 0.0 - 0.2 %    Comment: Performed at [facility], [address], [place], [place] [address]  Troponin I (High Sensitivity)     Status: Abnormal   Collection Time: 03/20/23  5:26 PM  Result Value Ref Range   Troponin I (High Sensitivity) 23 (H) <18 ng/L    Comment: (NOTE) Elevated high sensitivity troponin I (hsTnI) values and significant  changes across serial measurements may suggest ACS but many other  chronic and acute conditions are known to elevate hsTnI results.  Refer to the "Links" section for chest pain algorithms and additional  guidance. Performed at [facility], [address] [place], [place] [address]   Troponin I (High Sensitivity)     Status: Abnormal   Collection Time: 03/20/23  7:41 PM  Result Value Ref Range   Troponin I (High Sensitivity) 20 (H) <18 ng/L    Comment: (NOTE) Elevated high sensitivity troponin I (hsTnI) values and significant  changes across serial measurements may suggest ACS but many other  chronic and acute conditions are known to elevate hsTnI results.  Refer to the "Links" section for chest pain algorithms and additional  guidance. Performed at [facility], [address] [place], [place] [address]   CBG monitoring, ED     Status: Abnormal   Collection Time: 03/21/23  8:26 AM  Result Value Ref Range   Glucose-Capillary 127 (H) 70 - 99 mg/dL    Comment: Glucose reference range applies only to samples taken after fasting for at least 8 hours.  HIV Antibody (routine testing w rflx)     Status: None   Collection Time: 03/21/23  8:39 AM  Result Value Ref Range   HIV Screen 4th Generation wRfx Non Reactive Non Reactive    Comment: Performed at [facility], [address], [place], [place] [address]  Hemoglobin A1c     Status: Abnormal   Collection Time: 03/21/23  8:39 AM  Result Value Ref Range   Hgb A1c MFr Bld 7.1 (H) 4.8 -  5.6 %    Comment: (NOTE) Pre diabetes:          5.7%-6.4%  Diabetes:              >6.4%  Glycemic control for   <7.0% adults with diabetes    Mean Plasma Glucose 157.07 mg/dL    Comment: Performed at [facility], [address] [place],  [place] [address]  Lipid panel     Status: None   Collection Time: 03/21/23  8:39 AM  Result Value Ref Range   Cholesterol 138 0 - 200 mg/dL   Triglycerides 82 <604 mg/dL   HDL 61 >54 mg/dL   Total CHOL/HDL Ratio 2.3 RATIO   VLDL 16 0 - 40 mg/dL   LDL Cholesterol 61 0 - 99 mg/dL    Comment:        Total Cholesterol/HDL:CHD Risk Coronary Heart Disease Risk Table                     Men   Women  1/2 Average Risk   3.4   3.3  Average Risk       5.0   4.4  2 X Average Risk   9.6   7.1  3 X Average Risk  23.4   11.0        Use the calculated Patient Ratio above and the CHD Risk Table to determine the patient's CHD Risk.        ATP III CLASSIFICATION (LDL):  <100     mg/dL   Optimal  098-119  mg/dL   Near or Above                    Optimal  130-159  mg/dL   Borderline  147-829  mg/dL   High  >562     mg/dL   Very High Performed at [facility], [address], [place], [place] [address]   CBG monitoring, ED     Status: Abnormal   Collection Time: 03/21/23 11:26 AM  Result Value Ref Range   Glucose-Capillary 165 (H) 70 - 99 mg/dL    Comment: Glucose reference range applies only to samples taken after fasting for at least 8 hours.  CBG monitoring, ED     Status: Abnormal   Collection Time: 03/21/23 12:51 PM  Result Value Ref Range   Glucose-Capillary 139 (H) 70 - 99 mg/dL    Comment: Glucose reference range applies only to samples taken after fasting for at least 8 hours.   ECHOCARDIOGRAM COMPLETE  Result Date: 03/21/2023    ECHOCARDIOGRAM REPORT   Patient Name:   Raymond Harrell Date of Exam: 03/21/2023 Medical Rec #:  [id number]             Height:       76.0 in Accession #:    [id number]            Weight:        260.4 lb Date of Birth:  Mar 22, 1966             BSA:          2.478 m Patient Age:    57 years              BP:           132/83 mmHg Patient Gender: M                     HR:           75 bpm. Exam Location:  Inpatient Procedure: 2D Echo, Color Doppler, Cardiac Doppler and Intracardiac            Opacification Agent Indications:    R07.9* Chest pain, unspecified  History:        Patient has prior history of Echocardiogram examinations, most  recent 02/18/2012. CAD; Risk Factors:Hypertension, Dyslipidemia                 and Diabetes.  Sonographer:    Irving Burton Senior RDCS Referring Phys: [id number] Kathryne Sharper A WILMOT IMPRESSIONS  1. Left ventricular ejection fraction, by estimation, is 55%. The left ventricle has normal function. The left ventricle has no regional wall motion abnormalities. There is moderate asymmetric left ventricular hypertrophy of the septal segment. Left ventricular diastolic parameters are consistent with Grade I diastolic dysfunction (impaired relaxation).  2. Right ventricular systolic function is normal. The right ventricular size is normal. Tricuspid regurgitation signal is inadequate for assessing PA pressure.  3. The mitral valve is normal in structure. No evidence of mitral valve regurgitation. No evidence of mitral stenosis.  4. The aortic valve is normal in structure. Aortic valve regurgitation is not visualized. No aortic stenosis is present.  5. The inferior vena cava is normal in size with greater than 50% respiratory variability, suggesting right atrial pressure of 3 mmHg. FINDINGS  Left Ventricle: Left ventricular ejection fraction, by estimation, is 55%. The left ventricle has normal function. The left ventricle has no regional wall motion abnormalities. Definity contrast agent was given IV to delineate the left ventricular endocardial borders. The left ventricular internal cavity size was normal in size. There is moderate asymmetric left ventricular hypertrophy of the  septal segment. Left ventricular diastolic parameters are consistent with Grade I diastolic dysfunction (impaired relaxation). Right Ventricle: The right ventricular size is normal. No increase in right ventricular wall thickness. Right ventricular systolic function is normal. Tricuspid regurgitation signal is inadequate for assessing PA pressure. Left Atrium: Left atrial size was normal in size. Right Atrium: Right atrial size was normal in size. Pericardium: There is no evidence of pericardial effusion. Mitral Valve: The mitral valve is normal in structure. No evidence of mitral valve regurgitation. No evidence of mitral valve stenosis. Tricuspid Valve: The tricuspid valve is normal in structure. Tricuspid valve regurgitation is not demonstrated. No evidence of tricuspid stenosis. Aortic Valve: The aortic valve is normal in structure. Aortic valve regurgitation is not visualized. No aortic stenosis is present. Pulmonic Valve: The pulmonic valve was normal in structure. Pulmonic valve regurgitation is trivial. No evidence of pulmonic stenosis. Aorta: The aortic root is normal in size and structure. Venous: The inferior vena cava is normal in size with greater than 50% respiratory variability, suggesting right atrial pressure of 3 mmHg. IAS/Shunts: The atrial septum is grossly normal.  LEFT VENTRICLE PLAX 2D LVIDd:         4.20 cm   Diastology LVIDs:         2.60 cm   LV e' medial:    5.87 cm/s LV PW:         1.20 cm   LV E/e' medial:  9.4 LV IVS:        1.40 cm   LV e' lateral:   8.38 cm/s LVOT diam:     2.20 cm   LV E/e' lateral: 6.6 LV SV:         71 LV SV Index:   29 LVOT Area:     3.80 cm  RIGHT VENTRICLE RV S prime:     14.90 cm/s TAPSE (M-mode): 2.6 cm LEFT ATRIUM             Index        RIGHT ATRIUM           Index LA diam:  3.00 cm 1.21 cm/m   RA Area:     19.30 cm LA Vol (A2C):   63.5 ml 25.63 ml/m  RA Volume:   52.90 ml  21.35 ml/m LA Vol (A4C):   56.7 ml 22.88 ml/m LA Biplane Vol: 63.5 ml  25.63 ml/m  AORTIC VALVE LVOT Vmax:   99.20 cm/s LVOT Vmean:  64.200 cm/s LVOT VTI:    0.187 m  AORTA Ao Root diam: 3.10 cm Ao Asc diam:  3.40 cm MITRAL VALVE MV Area (PHT): 2.99 cm    SHUNTS MV Decel Time: 254 msec    Systemic VTI:  0.19 m MV E velocity: 55.30 cm/s  Systemic Diam: 2.20 cm MV A velocity: 70.30 cm/s MV E/A ratio:  0.79 Weston Brass MD Electronically signed by Weston Brass MD Signature Date/Time: 03/21/2023/8:43:27 AM    Final    CT Angio Chest/Abd/Pel for Dissection W and/or W/WO  Result Date: 03/20/2023 CLINICAL DATA:  Chest pain since Wednesday. Aortic aneurysm suspected EXAM: CT ANGIOGRAPHY CHEST, ABDOMEN AND PELVIS TECHNIQUE: Non-contrast CT of the chest was initially obtained. Multidetector CT imaging through the chest, abdomen and pelvis was performed using the standard protocol during bolus administration of intravenous contrast. Multiplanar reconstructed images and MIPs were obtained and reviewed to evaluate the vascular anatomy. RADIATION DOSE REDUCTION: This exam was performed according to the departmental dose-optimization program which includes automated exposure control, adjustment of the mA and/or kV according to patient size and/or use of iterative reconstruction technique. CONTRAST:  OMNIPAQUE IOHEXOL 350 MG/ML SOLN COMPARISON:  Chest radiographs earlier today; CT abdomen and pelvis 12/19/2018 and CTA chest 02/18/2012 FINDINGS: CTA CHEST FINDINGS Cardiovascular: No acute aortic syndrome. Normal heart size. No pericardial effusion. Mild coronary artery and aortic atherosclerotic calcification. Mediastinum/Nodes: Trachea and esophagus are unremarkable. No thoracic adenopathy. Lungs/Pleura: Scarring, consolidation, and bronchiectasis in the right upper lobe stable since 2013. The lungs are otherwise clear. No pleural effusion or pneumothorax. Musculoskeletal: No acute fracture. Review of the MIP images confirms the above findings. CTA ABDOMEN AND PELVIS FINDINGS  VASCULAR Normal caliber abdominal aorta without aneurysm or dissection. Atherosclerotic plaque. The mesenteric and renal arteries are widely patent without aneurysm or dissection. Patent inflow and outflow arteries without aneurysm or dissection. Review of the MIP images confirms the above findings. NON-VASCULAR Hepatobiliary: Unremarkable liver. Normal gallbladder. No biliary dilation. Pancreas: Unremarkable. Spleen: Unremarkable. Adrenals/Urinary Tract: Normal adrenal glands. No urinary calculi or hydronephrosis. Bladder is unremarkable. Stomach/Bowel: Normal caliber large and small bowel. No bowel wall thickening. Appendectomy.Stomach is within normal limits. Vascular/Lymphatic: No significant vascular findings are present. No enlarged abdominal or pelvic lymph nodes. Reproductive: Unremarkable. Other: No free intraperitoneal fluid or air. Musculoskeletal: No acute fracture. Review of the MIP images confirms the above findings. IMPRESSION: 1. No acute aortic syndrome. 2. No acute abnormality in the chest, abdomen, or pelvis. Aortic Atherosclerosis (ICD10-I70.0). Electronically Signed   By: Minerva Fester M.D.   On: 03/20/2023 23:35   DG Chest 2 View  Result Date: 03/20/2023 CLINICAL DATA:  Chest pain EXAM: CHEST - 2 VIEW COMPARISON:  12/19/2018 FINDINGS: Right apical scarring, stable. Lungs otherwise clear. No effusions. Heart and mediastinal contours within normal limits. No acute bony abnormality. IMPRESSION: No active cardiopulmonary disease. Electronically Signed   By: Charlett Nose M.D.   On: 03/20/2023 19:12    Pending Labs Unresulted Labs (From admission, onward)    None       Vitals/Pain Today's Vitals   03/21/23 0936 03/21/23 1100 03/21/23 1200 03/21/23 1300  BP: 131/73 (!) 147/82 132/78   Pulse: 74 81 76   Resp: 14 18 (!) 9   Temp: 98.5 F (36.9 C)     TempSrc: Oral     SpO2: 98% 94% 95%   Weight:      Height:      PainSc:    0-No pain    Isolation Precautions No active  isolations  Medications Medications  aspirin EC tablet 81 mg (81 mg Oral Given 03/21/23 0947)  lisinopril (ZESTRIL) tablet 20 mg (20 mg Oral Given 03/21/23 0947)  rosuvastatin (CRESTOR) tablet 40 mg (40 mg Oral Given 03/21/23 0947)  enoxaparin (LOVENOX) injection 40 mg (40 mg Subcutaneous Given 03/21/23 0946)  acetaminophen (TYLENOL) tablet 650 mg (has no administration in time range)    Or  acetaminophen (TYLENOL) suppository 650 mg (has no administration in time range)  insulin aspart (novoLOG) injection 0-9 Units (1 Units Subcutaneous Given 03/21/23 1259)  insulin aspart (novoLOG) injection 0-5 Units (has no administration in time range)  perflutren lipid microspheres (DEFINITY) IV suspension (3 mLs Intravenous Given 03/21/23 0824)  morphine (PF) 4 MG/ML injection 4 mg (4 mg Intravenous Given 03/20/23 2049)  iohexol (OMNIPAQUE) 350 MG/ML injection 100 mL (100 mLs Intravenous Contrast Given 03/20/23 2127)  nitroGLYCERIN (NITROSTAT) SL tablet 0.4 mg (0.4 mg Sublingual Given 03/20/23 2349)    Mobility walks     Focused Assessments Cardiac Assessment Handoff:  Cardiac Rhythm: Normal sinus rhythm Lab Results  Component Value Date   CKTOTAL 1,208 (H) 12/22/2018   TROPONINI <0.30 02/19/2012   Lab Results  Component Value Date   DDIMER 0.31 12/22/2018   Does the Patient currently have chest pain? No    R Recommendations: See Admitting Provider Note  Report given to:   Additional Notes: Pt ao x 4.  Very pleasant.  Has not c/o chest pain

## 2023-03-21 NOTE — Assessment & Plan Note (Addendum)
Troponin 20->23  Cardiology planning on LHC today

## 2023-03-21 NOTE — H&P (Signed)
History and Physical    Derrel Litteken Harrell XLK:[id number] DOB: [birth year] DOA: 03/20/2023  PCP: Gaspar Garbe, MD  Patient coming from: Home  Chief Complaint: Chest pain  HPI: Raymond Harrell is a 57 y.o. male with medical history significant of hypertension, type 2 diabetes, hyperlipidemia presented to ED with complaint of right-sided chest pain radiating to his back x 3 days.  EKG without acute ischemic changes.  Troponin 23> 20.  CTA chest/abdomen/pelvis negative for aortic dissection. EDP discussed with on-call cardiologist Dr. Aron Baba who recommended admission for observation since patient has risk factors for CAD and mild coronary artery calcification seen on CT. cardiology has ordered coronary CTA.  Patient was given morphine and sublingual nitroglycerin.  [facility] called to admit.  Patient is reporting 3-day history of intermittent sharp right-sided back pain which radiates to his chest.  No associated dyspnea, diaphoresis, nausea, or vomiting.  He denies history of CAD or blood clots in the past.  Denies any injuries to his back or chest.  Denies lifting any heavy weights.  His chest pain has resolved after he received morphine and sublingual nitroglycerin in the ED. No other complaints.  Review of Systems:  Review of Systems  All other systems reviewed and are negative.   Past Medical History:  Diagnosis Date   Abnormal CT scan    a. CT angio incidentally noted nodular opacity in upper abdomen felt lymph node vs part of pancreas, felt to be benign lesion with stable appearance.   Chest pain    a. Negative nuc 2010 (abnormal baseline EKG). b. Adm with pleuritic CP 02/2012: negative cardiac enzymes, CTA negative for PE  but did show bronchiectasis/scarring RUL.   Diabetes mellitus (HCC)    Diagnosed 02/2012-- 8/20/2020not on medication any longer-   Elevated blood pressure    Noted 02/2012   Elevated LDL cholesterol level    Noted 02/2012   History of kidney stones     Passed   Hypertension     Past Surgical History:  Procedure Laterality Date   HAND TENDON SURGERY Right    LAPAROSCOPIC APPENDECTOMY N/A 01/28/2019   Procedure: LAPAROSCOPIC APPENDECTOMY;  Surgeon: Harriette Bouillon, MD;  Location: [facility] OR;  Service: General;  Laterality: N/A;     reports that he quit smoking about 19 years ago. His smoking use included cigarettes. He started smoking about 43 years ago. He has never used smokeless tobacco. He reports current alcohol use of about 27.0 standard drinks of alcohol per week. He reports that he does not use drugs.  Allergies  Allergen Reactions   Other Shortness Of Breath, Swelling and Other (See Comments)    Spider venom = Dizziness, tight throat, bruising, and swelling- Required HOSPITALIZATION in 2022    Family History  Problem Relation Age of Onset   Heart attack Father        MI in his 73's    Prior to Admission medications   Medication Sig Start Date End Date Taking? Authorizing Provider  acetaminophen (TYLENOL) 500 MG tablet Take 1,000 mg by mouth every 6 (six) hours as needed (for pain.).   Yes [provider]  aspirin 81 MG EC tablet Take 81 mg by mouth daily.    Yes [provider]  Garlic 1000 MG CAPS Take 1,000 mg by mouth daily.   Yes [provider]  lisinopril (PRINIVIL,ZESTRIL) 20 MG tablet Take 1 tablet (20 mg total) by mouth daily. 12/09/17  Yes Mancel Bale, MD  metFORMIN (GLUCOPHAGE) 500 MG  tablet Take 500 mg by mouth 2 (two) times daily. 09/23/20  Yes [provider]  Multiple Vitamin (MULTIVITAMIN WITH MINERALS) TABS tablet Take 1 tablet by mouth daily.   Yes [provider]  Omega-3 Fatty Acids (FISH OIL) 500 MG CAPS Take 500 mg by mouth daily.   Yes [provider]  rosuvastatin (CRESTOR) 40 MG tablet Take 40 mg by mouth daily.   Yes [provider]  vitamin C (ASCORBIC ACID) 500 MG tablet Take 500 mg by mouth daily as needed.   Yes [provider]     Physical Exam: Vitals:   03/20/23 2349 03/21/23 0200 03/21/23 0300 03/21/23 0349  BP:  (!) 140/80 (!) 145/73   Pulse:  87 90   Resp:  16 18   Temp: 97.9 F (36.6 C)   98.3 F (36.8 C)  TempSrc:      SpO2:  95% 96%   Weight:      Height:        Physical Exam Vitals reviewed.  Constitutional:      General: He is not in acute distress. HENT:     Head: Normocephalic and atraumatic.  Eyes:     Extraocular Movements: Extraocular movements intact.  Cardiovascular:     Rate and Rhythm: Normal rate and regular rhythm.     Pulses: Normal pulses.  Pulmonary:     Effort: Pulmonary effort is normal. No respiratory distress.     Breath sounds: Normal breath sounds. No wheezing or rales.  Abdominal:     General: Bowel sounds are normal. There is no distension.     Palpations: Abdomen is soft.     Tenderness: There is no abdominal tenderness.  Musculoskeletal:     Cervical back: Normal range of motion.     Right lower leg: No edema.     Left lower leg: No edema.  Skin:    General: Skin is warm and dry.  Neurological:     General: No focal deficit present.     Mental Status: He is alert and oriented to person, place, and time.     Labs on Admission: I have personally reviewed following labs and imaging studies  CBC: Recent Labs  Lab 03/20/23 1726  WBC 6.6  HGB 13.3  HCT 42.0  MCV 81.4  PLT 215   Basic Metabolic Panel: Recent Labs  Lab 03/20/23 1726  NA 134*  K 3.8  CL 99  CO2 21*  GLUCOSE 106*  BUN 17  CREATININE 0.93  CALCIUM 9.7   GFR: Estimated Creatinine Clearance: 123.1 mL/min (by C-G formula based on SCr of 0.93 mg/dL). Liver Function Tests: No results for input(s): "AST", "ALT", "ALKPHOS", "BILITOT", "PROT", "ALBUMIN" in the last 168 hours. No results for input(s): "LIPASE", "AMYLASE" in the last 168 hours. No results for input(s): "AMMONIA" in the last 168 hours. Coagulation Profile: No results for input(s): "INR", "PROTIME" in the last 168  hours. Cardiac Enzymes: No results for input(s): "CKTOTAL", "CKMB", "CKMBINDEX", "TROPONINI" in the last 168 hours. BNP (last 3 results) No results for input(s): "PROBNP" in the last 8760 hours. HbA1C: No results for input(s): "HGBA1C" in the last 72 hours. CBG: No results for input(s): "GLUCAP" in the last 168 hours. Lipid Profile: No results for input(s): "CHOL", "HDL", "LDLCALC", "TRIG", "CHOLHDL", "LDLDIRECT" in the last 72 hours. Thyroid Function Tests: No results for input(s): "TSH", "T4TOTAL", "FREET4", "T3FREE", "THYROIDAB" in the last 72 hours. Anemia Panel: No results for input(s): "VITAMINB12", "FOLATE", "FERRITIN", "TIBC", "IRON", "  RETICCTPCT" in the last 72 hours. Urine analysis:    Component Value Date/Time   COLORURINE YELLOW 12/19/2018 1826   APPEARANCEUR CLEAR 12/19/2018 1826   LABSPEC 1.035 (H) 12/19/2018 1826   PHURINE 6.0 12/19/2018 1826   GLUCOSEU NEGATIVE 12/19/2018 1826   HGBUR NEGATIVE 12/19/2018 1826   BILIRUBINUR NEGATIVE 12/19/2018 1826   BILIRUBINUR neg 02/24/2012 0907   KETONESUR NEGATIVE 12/19/2018 1826   PROTEINUR 30 (A) 12/19/2018 1826   UROBILINOGEN 0.2 02/24/2012 0907   UROBILINOGEN 0.2 02/18/2012 1102   NITRITE NEGATIVE 12/19/2018 1826   LEUKOCYTESUR NEGATIVE 12/19/2018 1826    Radiological Exams on Admission: CT Angio Chest/Abd/Pel for Dissection W and/or W/WO  Result Date: 03/20/2023 CLINICAL DATA:  Chest pain since Wednesday. Aortic aneurysm suspected EXAM: CT ANGIOGRAPHY CHEST, ABDOMEN AND PELVIS TECHNIQUE: Non-contrast CT of the chest was initially obtained. Multidetector CT imaging through the chest, abdomen and pelvis was performed using the standard protocol during bolus administration of intravenous contrast. Multiplanar reconstructed images and MIPs were obtained and reviewed to evaluate the vascular anatomy. RADIATION DOSE REDUCTION: This exam was performed according to the departmental dose-optimization program which includes  automated exposure control, adjustment of the mA and/or kV according to patient size and/or use of iterative reconstruction technique. CONTRAST:  OMNIPAQUE IOHEXOL 350 MG/ML SOLN COMPARISON:  Chest radiographs earlier today; CT abdomen and pelvis 12/19/2018 and CTA chest 02/18/2012 FINDINGS: CTA CHEST FINDINGS Cardiovascular: No acute aortic syndrome. Normal heart size. No pericardial effusion. Mild coronary artery and aortic atherosclerotic calcification. Mediastinum/Nodes: Trachea and esophagus are unremarkable. No thoracic adenopathy. Lungs/Pleura: Scarring, consolidation, and bronchiectasis in the right upper lobe stable since 2013. The lungs are otherwise clear. No pleural effusion or pneumothorax. Musculoskeletal: No acute fracture. Review of the MIP images confirms the above findings. CTA ABDOMEN AND PELVIS FINDINGS VASCULAR Normal caliber abdominal aorta without aneurysm or dissection. Atherosclerotic plaque. The mesenteric and renal arteries are widely patent without aneurysm or dissection. Patent inflow and outflow arteries without aneurysm or dissection. Review of the MIP images confirms the above findings. NON-VASCULAR Hepatobiliary: Unremarkable liver. Normal gallbladder. No biliary dilation. Pancreas: Unremarkable. Spleen: Unremarkable. Adrenals/Urinary Tract: Normal adrenal glands. No urinary calculi or hydronephrosis. Bladder is unremarkable. Stomach/Bowel: Normal caliber large and small bowel. No bowel wall thickening. Appendectomy.Stomach is within normal limits. Vascular/Lymphatic: No significant vascular findings are present. No enlarged abdominal or pelvic lymph nodes. Reproductive: Unremarkable. Other: No free intraperitoneal fluid or air. Musculoskeletal: No acute fracture. Review of the MIP images confirms the above findings. IMPRESSION: 1. No acute aortic syndrome. 2. No acute abnormality in the chest, abdomen, or pelvis. Aortic Atherosclerosis (ICD10-I70.0). Electronically Signed    By: Minerva Fester M.D.   On: 03/20/2023 23:35   DG Chest 2 View  Result Date: 03/20/2023 CLINICAL DATA:  Chest pain EXAM: CHEST - 2 VIEW COMPARISON:  12/19/2018 FINDINGS: Right apical scarring, stable. Lungs otherwise clear. No effusions. Heart and mediastinal contours within normal limits. No acute bony abnormality. IMPRESSION: No active cardiopulmonary disease. Electronically Signed   By: Charlett Nose M.D.   On: 03/20/2023 19:12    EKG: Independently reviewed.  Sinus rhythm, T wave abnormality in lateral leads.  No significant change compared to previous EKG.  Assessment and Plan  Chest pain Appears atypical.  EKG without acute ischemic changes.  Troponin 23> 20, not consistent with ACS.  CTA chest/abdomen/pelvis negative for aortic dissection.  Chest pain resolved after patient was given morphine and sublingual nitroglycerin in ED.  EDP discussed with on-call cardiologist Dr.  Wilmot who recommended admission for observation since patient has risk factors for CAD and mild coronary artery calcification seen on CT. Cardiology has ordered coronary CTA.  Hypertension Continue lisinopril.  Hyperlipidemia Continue Crestor.  Type 2 diabetes A1c 6.8 in August 2022, repeat ordered.  Sensitive sliding scale insulin.  DVT prophylaxis: Lovenox Code Status: Full Code (discussed with the patient) Level of care: Telemetry bed Admission status: It is my clinical opinion that referral for OBSERVATION is reasonable and necessary in this patient based on the above information provided. The aforementioned taken together are felt to place the patient at high risk for further clinical deterioration. However, it is anticipated that the patient may be medically stable for discharge from the hospital within 24 to 48 hours.  John Giovanni MD [facility]  If 7PM-7AM, please contact night-coverage [email or web address]  03/21/2023, 5:57 AM

## 2023-03-21 NOTE — Progress Notes (Signed)
Unable to perform coronary CTA or nuclear stress test over the weekend due to staffing.  Will review echocardiogram and plan to follow. Parke Poisson, MD

## 2023-03-21 NOTE — Assessment & Plan Note (Signed)
Admitted for chest pain. Troponin 20->23. Pt seen by cardiology. Echo shows no wall motion abnormality.   EKG shows new TWI in V4-V6 that was not present in 01-2021  [facility] tomorrow. NPO after MN

## 2023-03-22 DIAGNOSIS — R0789 Other chest pain: Secondary | ICD-10-CM

## 2023-03-22 DIAGNOSIS — E119 Type 2 diabetes mellitus without complications: Secondary | ICD-10-CM | POA: Diagnosis not present

## 2023-03-22 DIAGNOSIS — I10 Essential (primary) hypertension: Secondary | ICD-10-CM

## 2023-03-22 DIAGNOSIS — I200 Unstable angina: Secondary | ICD-10-CM | POA: Diagnosis not present

## 2023-03-22 DIAGNOSIS — I25110 Atherosclerotic heart disease of native coronary artery with unstable angina pectoris: Secondary | ICD-10-CM | POA: Diagnosis not present

## 2023-03-22 DIAGNOSIS — E785 Hyperlipidemia, unspecified: Secondary | ICD-10-CM | POA: Diagnosis not present

## 2023-03-22 DIAGNOSIS — R079 Chest pain, unspecified: Secondary | ICD-10-CM | POA: Diagnosis not present

## 2023-03-22 LAB — GLUCOSE, CAPILLARY
Glucose-Capillary: 145 mg/dL — ABNORMAL HIGH (ref 70–99)
Glucose-Capillary: 144 mg/dL — ABNORMAL HIGH (ref 70–99)
Glucose-Capillary: 170 mg/dL — ABNORMAL HIGH (ref 70–99)
Glucose-Capillary: 163 mg/dL — ABNORMAL HIGH (ref 70–99)
Glucose-Capillary: 155 mg/dL — ABNORMAL HIGH (ref 70–99)

## 2023-03-22 MED ORDER — SODIUM CHLORIDE 0.9 % WEIGHT BASED INFUSION
1.0000 mL/kg/h | INTRAVENOUS | Status: DC
  Administered 2023-03-23: 1 mL/kg/h via INTRAVENOUS

## 2023-03-22 MED ORDER — ASPIRIN 81 MG PO CHEW
81.0000 mg | CHEWABLE_TABLET | ORAL | Status: AC
  Administered 2023-03-23: 81 mg via ORAL
  Filled 2023-03-22: qty 1

## 2023-03-22 MED ORDER — ISOSORBIDE MONONITRATE ER 30 MG PO TB24
30.0000 mg | ORAL_TABLET | Freq: Every day | ORAL | Status: DC
  Administered 2023-03-22 – 2023-03-23 (×2): 30 mg via ORAL
  Filled 2023-03-22 (×2): qty 1

## 2023-03-22 MED ORDER — SODIUM CHLORIDE 0.9 % WEIGHT BASED INFUSION
3.0000 mL/kg/h | INTRAVENOUS | Status: DC
  Administered 2023-03-23: 3 mL/kg/h via INTRAVENOUS

## 2023-03-22 NOTE — Progress Notes (Signed)
   Patient Name: Raymond Harrell Date of Encounter: 03/22/2023 [facility] Cardiologist: None   Interval Summary  .    Cp currently 4/10. Otherwise well. Starting imdur, plan for cath tomorrow.   Vital Signs .    Vitals:   03/21/23 1938 03/21/23 2324 03/22/23 0502 03/22/23 0808  BP: (!) 141/75 135/72 117/70 133/70  Pulse: 78 88 76 77  Resp: 20 18 18 18   Temp: 98 F (36.7 C) 98.6 F (37 C) 98.5 F (36.9 C) 98.6 F (37 C)  TempSrc: Oral Oral Oral Oral  SpO2: 96% 94% 96% 95%  Weight:      Height:        Intake/Output Summary (Last 24 hours) at 03/22/2023 0855 Last data filed at 03/21/2023 2015 Gross per 24 hour  Intake 120 ml  Output --  Net 120 ml      03/20/2023    4:57 PM 01/07/2021    3:11 PM 01/27/2019   12:23 PM  Last 3 Weights  Weight (lbs) 260 lb 5.8 oz 260 lb 6.4 oz   Weight (kg) 118.1 kg 118.117 kg      Information is confidential and restricted. Go to Review Flowsheets to unlock data.      Telemetry/ECG    SR - Personally Reviewed  Physical Exam .   GEN: No acute distress.   Neck: No JVD Cardiac: RRR, no murmurs, rubs, or gallops.  Respiratory: Clear to auscultation bilaterally. GI: Soft, nontender, non-distended  MS: No edema  Assessment & Plan .     Principal Problem:   Chest pain Active Problems:   Essential hypertension   Unstable angina (HCC)   Dyslipidemia   Type 2 diabetes mellitus without complication, without long-term current use of insulin (HCC)  CP - mild troponin elevation and mild increase in T wave abnormality laterally. With diabetes and HbA1C 7.1, cp radiating from back to chest, RCA calcifications on recent CT, and no ability to get noninvasive stress imaging over weekend, would recommend cardiac catheterization on Monday. Pt and family agreeable. - findings could all be related to BP if no obstructive CAD, will add imdur for dual benefit of cp and HTN  - current chest pain, get EKG, discussed with Andres Shad  RN. - lipids well controlled on crestor 40 mg daily. LDL 61. Adjust goal as needed after cath.   INFORMED CONSENT: I have reviewed the risks, indications, and alternatives to cardiac catheterization, possible angioplasty, and stenting with the patient. Risks include but are not limited to bleeding, infection, vascular injury, stroke, myocardial infarction, arrhythmia, kidney injury, radiation-related injury in the case of prolonged fluoroscopy use, emergency cardiac surgery, and death. The patient understands the risks of serious complication is 1-2 in 1000 with diagnostic cardiac cath and 1-2% or less with angioplasty/stenting.     For questions or updates, please contact [facility] Please consult [email or web address] for contact info under        Signed, Parke Poisson, MD

## 2023-03-22 NOTE — Progress Notes (Signed)
03/20/23 1657  Weight: 118.1 kg    Examination:  Physical Exam Vitals and nursing note reviewed.  Constitutional:      General: He is not in acute distress.    Appearance: He is normal weight. He is not toxic-appearing or diaphoretic.  HENT:     Head: Normocephalic and atraumatic.     Nose: Nose normal.  Cardiovascular:     Rate and Rhythm: Normal rate and regular rhythm.  Pulmonary:     Effort: Pulmonary effort is normal.     Breath sounds: Normal breath sounds.  Abdominal:     General: Bowel sounds  are normal. There is no distension.     Palpations: Abdomen is soft.     Tenderness: There is no abdominal tenderness.  Musculoskeletal:     Right lower leg: No edema.     Left lower leg: No edema.  Skin:    General: Skin is warm and dry.  Neurological:     General: No focal deficit present.     Mental Status: He is alert and oriented to person, place, and time.     Data Reviewed: I have personally reviewed following labs and imaging studies  CBC: Recent Labs  Lab 03/20/23 1726  WBC 6.6  HGB 13.3  HCT 42.0  MCV 81.4  PLT 215   Basic Metabolic Panel: Recent Labs  Lab 03/20/23 1726  NA 134*  K 3.8  CL 99  CO2 21*  GLUCOSE 106*  BUN 17  CREATININE 0.93  CALCIUM 9.7   GFR: Estimated Creatinine Clearance: 123.1 mL/min (by C-G formula based on SCr of 0.93 mg/dL).  HbA1C: Recent Labs    03/21/23 0839  HGBA1C 7.1*   CBG: Recent Labs  Lab 03/21/23 1251 03/21/23 1548 03/21/23 2043 03/22/23 0806 03/22/23 1154  GLUCAP 139* 110* 132* 145* 144*   Lipid Profile: Recent Labs    03/21/23 0839  CHOL 138  HDL 61  LDLCALC 61  TRIG 82  CHOLHDL 2.3    Radiology Studies: ECHOCARDIOGRAM COMPLETE  Result Date: 03/21/2023    ECHOCARDIOGRAM REPORT   Patient Name:   Raymond Harrell Date of Exam: 03/21/2023 Medical Rec #:  [id number]             Height:       76.0 in Accession #:    [id number]            Weight:       260.4 lb Date of Birth:  06-07-1966             BSA:          2.478 m Patient Age:    57 years              BP:           132/83 mmHg Patient Gender: M                     HR:           75 bpm. Exam Location:  Inpatient Procedure: 2D Echo, Color Doppler, Cardiac Doppler and Intracardiac            Opacification Agent Indications:    R07.9* Chest pain, unspecified  History:        Patient has prior history of Echocardiogram examinations, most                 recent 02/18/2012. CAD; Risk Factors:Hypertension, Dyslipidemia  PROGRESS NOTE    Raymond Harrell  ZHY:[id number] DOB: 08/23/1965 DOA: 03/20/2023 PCP: Gaspar Garbe, MD  Subjective: Pt seen and examined. No CP when I saw him this AM.  Wife at bedside  Plan for [facility] on Monday.   Hospital Course: HPI: Raymond Harrell is a 57 y.o. male with medical history significant of hypertension, type 2 diabetes, hyperlipidemia presented to ED with complaint of right-sided chest pain radiating to his back x 3 days.  EKG without acute ischemic changes.  Troponin 23> 20.  CTA chest/abdomen/pelvis negative for aortic dissection. EDP discussed with on-call cardiologist Dr. Aron Baba who recommended admission for observation since patient has risk factors for CAD and mild coronary artery calcification seen on CT. cardiology has ordered coronary CTA.  Patient was given morphine and sublingual nitroglycerin.  TRH called to admit.   Patient is reporting 3-day history of intermittent sharp right-sided back pain which radiates to his chest.  No associated dyspnea, diaphoresis, nausea, or vomiting.  He denies history of CAD or blood clots in the past.  Denies any injuries to his back or chest.  Denies lifting any heavy weights.  His chest pain has resolved after he received morphine and sublingual nitroglycerin in the ED. No other complaints.  Significant Events: Admitted 03/20/2023 for chest pain   Significant Labs: Troponin 20->23 TC 138, TG 82, HDL 61, LDL 61 A1C 7.1%  Significant Imaging Studies: CTA chest/abd shows 1. No acute aortic syndrome. 2. No acute abnormality in the chest, abdomen, or pelvis.  Antibiotic Therapy: Anti-infectives (From admission, onward)    None       Procedures:   Consultants: cardiology    Assessment and Plan: * Chest pain Admitted for chest pain. Troponin 20->23. Pt seen by cardiology. Echo shows no wall motion abnormality.   EKG shows new TWI in V4-V6 that was not present in 01-2021  [facility] tomorrow. NPO after  [place]    Type 2 diabetes mellitus without complication, without long-term current use of insulin (HCC) A1C 7.1%. only on metformin. Trying to control his CBG with diet and exercise. Will need f/u with PCP to discuss further intervention.  On SSI.  Dyslipidemia Remains on crestor 40 mg every day.  Unstable angina [facility]) Troponin 20->23  Cardiology planning on LHC on monday  Essential hypertension Continue lisinopril 20 mg daily.  03-22-2023. Cardiology added Imdur to his HTN meds today.       DVT prophylaxis: enoxaparin (LOVENOX) injection 40 mg Start: 03/21/23 1000     Code Status: Full Code Family Communication: discussed with pt and wife at bedside. They asked when he could go home. Discussed that if LHC tomorrow was normal, he could go home tomorrow. If he had PCI, it would depend on cardiology if they wanted him to stay or go home. Disposition Plan: home Reason for continuing need for hospitalization: needs LHC tomorrow.  Objective: Vitals:   03/22/23 0808 03/22/23 0900 03/22/23 1006 03/22/23 1158  BP: 133/70 (!) 148/91 (!) 132/90 109/72  Pulse: 77 78  81  Resp: 18   16  Temp: 98.6 F (37 C)   99 F (37.2 C)  TempSrc: Oral   Oral  SpO2: 95% 97%  94%  Weight:      Height:        Intake/Output Summary (Last 24 hours) at 03/22/2023 1314 Last data filed at 03/22/2023 0900 Gross per 24 hour  Intake 300 ml  Output --  Net 300 ml   [employer]  03/20/23 1657  Weight: 118.1 kg    Examination:  Physical Exam Vitals and nursing note reviewed.  Constitutional:      General: He is not in acute distress.    Appearance: He is normal weight. He is not toxic-appearing or diaphoretic.  HENT:     Head: Normocephalic and atraumatic.     Nose: Nose normal.  Cardiovascular:     Rate and Rhythm: Normal rate and regular rhythm.  Pulmonary:     Effort: Pulmonary effort is normal.     Breath sounds: Normal breath sounds.  Abdominal:     General: Bowel sounds  are normal. There is no distension.     Palpations: Abdomen is soft.     Tenderness: There is no abdominal tenderness.  Musculoskeletal:     Right lower leg: No edema.     Left lower leg: No edema.  Skin:    General: Skin is warm and dry.  Neurological:     General: No focal deficit present.     Mental Status: He is alert and oriented to person, place, and time.     Data Reviewed: I have personally reviewed following labs and imaging studies  CBC: Recent Labs  Lab 03/20/23 1726  WBC 6.6  HGB 13.3  HCT 42.0  MCV 81.4  PLT 215   Basic Metabolic Panel: Recent Labs  Lab 03/20/23 1726  NA 134*  K 3.8  CL 99  CO2 21*  GLUCOSE 106*  BUN 17  CREATININE 0.93  CALCIUM 9.7   GFR: Estimated Creatinine Clearance: 123.1 mL/min (by C-G formula based on SCr of 0.93 mg/dL).  HbA1C: Recent Labs    03/21/23 0839  HGBA1C 7.1*   CBG: Recent Labs  Lab 03/21/23 1251 03/21/23 1548 03/21/23 2043 03/22/23 0806 03/22/23 1154  GLUCAP 139* 110* 132* 145* 144*   Lipid Profile: Recent Labs    03/21/23 0839  CHOL 138  HDL 61  LDLCALC 61  TRIG 82  CHOLHDL 2.3    Radiology Studies: ECHOCARDIOGRAM COMPLETE  Result Date: 03/21/2023    ECHOCARDIOGRAM REPORT   Patient Name:   Raymond Harrell Date of Exam: 03/21/2023 Medical Rec #:  [id number]             Height:       76.0 in Accession #:    [id number]            Weight:       260.4 lb Date of Birth:  06-07-1966             BSA:          2.478 m Patient Age:    57 years              BP:           132/83 mmHg Patient Gender: M                     HR:           75 bpm. Exam Location:  Inpatient Procedure: 2D Echo, Color Doppler, Cardiac Doppler and Intracardiac            Opacification Agent Indications:    R07.9* Chest pain, unspecified  History:        Patient has prior history of Echocardiogram examinations, most                 recent 02/18/2012. CAD; Risk Factors:Hypertension, Dyslipidemia  PROGRESS NOTE    Raymond Harrell  ZHY:[id number] DOB: 08/23/1965 DOA: 03/20/2023 PCP: Gaspar Garbe, MD  Subjective: Pt seen and examined. No CP when I saw him this AM.  Wife at bedside  Plan for [facility] on Monday.   Hospital Course: HPI: Raymond Harrell is a 57 y.o. male with medical history significant of hypertension, type 2 diabetes, hyperlipidemia presented to ED with complaint of right-sided chest pain radiating to his back x 3 days.  EKG without acute ischemic changes.  Troponin 23> 20.  CTA chest/abdomen/pelvis negative for aortic dissection. EDP discussed with on-call cardiologist Dr. Aron Baba who recommended admission for observation since patient has risk factors for CAD and mild coronary artery calcification seen on CT. cardiology has ordered coronary CTA.  Patient was given morphine and sublingual nitroglycerin.  TRH called to admit.   Patient is reporting 3-day history of intermittent sharp right-sided back pain which radiates to his chest.  No associated dyspnea, diaphoresis, nausea, or vomiting.  He denies history of CAD or blood clots in the past.  Denies any injuries to his back or chest.  Denies lifting any heavy weights.  His chest pain has resolved after he received morphine and sublingual nitroglycerin in the ED. No other complaints.  Significant Events: Admitted 03/20/2023 for chest pain   Significant Labs: Troponin 20->23 TC 138, TG 82, HDL 61, LDL 61 A1C 7.1%  Significant Imaging Studies: CTA chest/abd shows 1. No acute aortic syndrome. 2. No acute abnormality in the chest, abdomen, or pelvis.  Antibiotic Therapy: Anti-infectives (From admission, onward)    None       Procedures:   Consultants: cardiology    Assessment and Plan: * Chest pain Admitted for chest pain. Troponin 20->23. Pt seen by cardiology. Echo shows no wall motion abnormality.   EKG shows new TWI in V4-V6 that was not present in 01-2021  [facility] tomorrow. NPO after  [place]    Type 2 diabetes mellitus without complication, without long-term current use of insulin (HCC) A1C 7.1%. only on metformin. Trying to control his CBG with diet and exercise. Will need f/u with PCP to discuss further intervention.  On SSI.  Dyslipidemia Remains on crestor 40 mg every day.  Unstable angina [facility]) Troponin 20->23  Cardiology planning on LHC on monday  Essential hypertension Continue lisinopril 20 mg daily.  03-22-2023. Cardiology added Imdur to his HTN meds today.       DVT prophylaxis: enoxaparin (LOVENOX) injection 40 mg Start: 03/21/23 1000     Code Status: Full Code Family Communication: discussed with pt and wife at bedside. They asked when he could go home. Discussed that if LHC tomorrow was normal, he could go home tomorrow. If he had PCI, it would depend on cardiology if they wanted him to stay or go home. Disposition Plan: home Reason for continuing need for hospitalization: needs LHC tomorrow.  Objective: Vitals:   03/22/23 0808 03/22/23 0900 03/22/23 1006 03/22/23 1158  BP: 133/70 (!) 148/91 (!) 132/90 109/72  Pulse: 77 78  81  Resp: 18   16  Temp: 98.6 F (37 C)   99 F (37.2 C)  TempSrc: Oral   Oral  SpO2: 95% 97%  94%  Weight:      Height:        Intake/Output Summary (Last 24 hours) at 03/22/2023 1314 Last data filed at 03/22/2023 0900 Gross per 24 hour  Intake 300 ml  Output --  Net 300 ml   [employer]  03/20/23 1657  Weight: 118.1 kg    Examination:  Physical Exam Vitals and nursing note reviewed.  Constitutional:      General: He is not in acute distress.    Appearance: He is normal weight. He is not toxic-appearing or diaphoretic.  HENT:     Head: Normocephalic and atraumatic.     Nose: Nose normal.  Cardiovascular:     Rate and Rhythm: Normal rate and regular rhythm.  Pulmonary:     Effort: Pulmonary effort is normal.     Breath sounds: Normal breath sounds.  Abdominal:     General: Bowel sounds  are normal. There is no distension.     Palpations: Abdomen is soft.     Tenderness: There is no abdominal tenderness.  Musculoskeletal:     Right lower leg: No edema.     Left lower leg: No edema.  Skin:    General: Skin is warm and dry.  Neurological:     General: No focal deficit present.     Mental Status: He is alert and oriented to person, place, and time.     Data Reviewed: I have personally reviewed following labs and imaging studies  CBC: Recent Labs  Lab 03/20/23 1726  WBC 6.6  HGB 13.3  HCT 42.0  MCV 81.4  PLT 215   Basic Metabolic Panel: Recent Labs  Lab 03/20/23 1726  NA 134*  K 3.8  CL 99  CO2 21*  GLUCOSE 106*  BUN 17  CREATININE 0.93  CALCIUM 9.7   GFR: Estimated Creatinine Clearance: 123.1 mL/min (by C-G formula based on SCr of 0.93 mg/dL).  HbA1C: Recent Labs    03/21/23 0839  HGBA1C 7.1*   CBG: Recent Labs  Lab 03/21/23 1251 03/21/23 1548 03/21/23 2043 03/22/23 0806 03/22/23 1154  GLUCAP 139* 110* 132* 145* 144*   Lipid Profile: Recent Labs    03/21/23 0839  CHOL 138  HDL 61  LDLCALC 61  TRIG 82  CHOLHDL 2.3    Radiology Studies: ECHOCARDIOGRAM COMPLETE  Result Date: 03/21/2023    ECHOCARDIOGRAM REPORT   Patient Name:   Raymond Harrell Date of Exam: 03/21/2023 Medical Rec #:  [id number]             Height:       76.0 in Accession #:    [id number]            Weight:       260.4 lb Date of Birth:  06-07-1966             BSA:          2.478 m Patient Age:    57 years              BP:           132/83 mmHg Patient Gender: M                     HR:           75 bpm. Exam Location:  Inpatient Procedure: 2D Echo, Color Doppler, Cardiac Doppler and Intracardiac            Opacification Agent Indications:    R07.9* Chest pain, unspecified  History:        Patient has prior history of Echocardiogram examinations, most                 recent 02/18/2012. CAD; Risk Factors:Hypertension, Dyslipidemia

## 2023-03-23 ENCOUNTER — Encounter (HOSPITAL_COMMUNITY)
Admission: EM | Disposition: A | Discharge status: Home / Self Care | Payer: Self-pay | Source: Home / Self Care | Attending: Emergency Medicine

## 2023-03-23 ENCOUNTER — Encounter (HOSPITAL_COMMUNITY): Payer: Self-pay | Admitting: Internal Medicine

## 2023-03-23 DIAGNOSIS — I2510 Atherosclerotic heart disease of native coronary artery without angina pectoris: Secondary | ICD-10-CM

## 2023-03-23 DIAGNOSIS — E119 Type 2 diabetes mellitus without complications: Secondary | ICD-10-CM | POA: Diagnosis not present

## 2023-03-23 DIAGNOSIS — I4719 Other supraventricular tachycardia: Secondary | ICD-10-CM

## 2023-03-23 DIAGNOSIS — E785 Hyperlipidemia, unspecified: Secondary | ICD-10-CM | POA: Diagnosis not present

## 2023-03-23 DIAGNOSIS — I10 Essential (primary) hypertension: Secondary | ICD-10-CM | POA: Diagnosis not present

## 2023-03-23 DIAGNOSIS — R072 Precordial pain: Secondary | ICD-10-CM | POA: Diagnosis not present

## 2023-03-23 DIAGNOSIS — R079 Chest pain, unspecified: Secondary | ICD-10-CM | POA: Diagnosis not present

## 2023-03-23 HISTORY — PX: LEFT HEART CATH AND CORONARY ANGIOGRAPHY: CATH118249

## 2023-03-23 LAB — MAGNESIUM: Magnesium: 1.9 mg/dL (ref 1.7–2.4)

## 2023-03-23 LAB — BASIC METABOLIC PANEL
Anion gap: 13 (ref 5–15)
BUN: 14 mg/dL (ref 6–20)
CO2: 22 mmol/L (ref 22–32)
Calcium: 9.4 mg/dL (ref 8.9–10.3)
Chloride: 102 mmol/L (ref 98–111)
Creatinine, Ser: 1.05 mg/dL (ref 0.61–1.24)
GFR, Estimated: >60 mL/min (ref >60)
Glucose, Bld: 151 mg/dL — ABNORMAL HIGH (ref 70–99)
Potassium: 4.4 mmol/L (ref 3.5–5.1)
Sodium: 137 mmol/L (ref 135–145)

## 2023-03-23 LAB — CBC
HCT: 41 % (ref 39.0–52.0)
Hemoglobin: 12.8 g/dL — ABNORMAL LOW (ref 13.0–17.0)
MCH: 24.9 pg — ABNORMAL LOW (ref 26.0–34.0)
MCHC: 31.2 g/dL (ref 30.0–36.0)
MCV: 79.6 fL — ABNORMAL LOW (ref 80.0–100.0)
Platelets: 194 10*3/uL (ref 150–400)
RBC: 5.15 MIL/uL (ref 4.22–5.81)
RDW: 14.7 % (ref 11.5–15.5)
WBC: 6.2 10*3/uL (ref 4.0–10.5)
nRBC: 0 % (ref 0.0–0.2)

## 2023-03-23 LAB — GLUCOSE, CAPILLARY
Glucose-Capillary: 130 mg/dL — ABNORMAL HIGH (ref 70–99)
Glucose-Capillary: 110 mg/dL — ABNORMAL HIGH (ref 70–99)
Glucose-Capillary: 88 mg/dL (ref 70–99)

## 2023-03-23 LAB — TSH: TSH: 1.554 u[IU]/mL (ref 0.350–4.500)

## 2023-03-23 SURGERY — LEFT HEART CATH AND CORONARY ANGIOGRAPHY
Anesthesia: LOCAL

## 2023-03-23 MED ORDER — LIDOCAINE HCL (PF) 1 % IJ SOLN
INTRAMUSCULAR | Status: DC | PRN
  Administered 2023-03-23: 2 mL

## 2023-03-23 MED ORDER — MIDAZOLAM HCL 2 MG/2ML IJ SOLN
INTRAMUSCULAR | Status: DC | PRN
  Administered 2023-03-23: 2 mg via INTRAVENOUS

## 2023-03-23 MED ORDER — VERAPAMIL HCL 2.5 MG/ML IV SOLN
INTRAVENOUS | Status: DC | PRN
  Administered 2023-03-23: 10 mL via INTRA_ARTERIAL

## 2023-03-23 MED ORDER — HEPARIN (PORCINE) IN NACL 1000-0.9 UT/500ML-% IV SOLN
INTRAVENOUS | Status: DC | PRN
  Administered 2023-03-23 (×2): 500 mL via INTRA_ARTERIAL

## 2023-03-23 MED ORDER — ENOXAPARIN SODIUM 40 MG/0.4ML IJ SOSY: 40.0000 mg | PREFILLED_SYRINGE | INTRAMUSCULAR | Status: DC

## 2023-03-23 MED ORDER — SODIUM CHLORIDE 0.9 % IV SOLN: 250.0000 mL | INTRAVENOUS | Status: DC | PRN

## 2023-03-23 MED ORDER — LIDOCAINE HCL (PF) 1 % IJ SOLN
INTRAMUSCULAR | Status: AC
  Filled 2023-03-23: qty 30

## 2023-03-23 MED ORDER — FENTANYL CITRATE (PF) 100 MCG/2ML IJ SOLN
INTRAMUSCULAR | Status: AC
  Filled 2023-03-23: qty 2

## 2023-03-23 MED ORDER — IOHEXOL 350 MG/ML SOLN
INTRAVENOUS | Status: DC | PRN
  Administered 2023-03-23: 35 mL via INTRA_ARTERIAL

## 2023-03-23 MED ORDER — FENTANYL CITRATE (PF) 100 MCG/2ML IJ SOLN
INTRAMUSCULAR | Status: DC | PRN
  Administered 2023-03-23: 25 ug via INTRAVENOUS

## 2023-03-23 MED ORDER — SODIUM CHLORIDE 0.9% FLUSH: 3.0000 mL | Freq: Two times a day (BID) | INTRAVENOUS | Status: DC

## 2023-03-23 MED ORDER — ISOSORBIDE MONONITRATE ER 30 MG PO TB24: 30.0000 mg | ORAL_TABLET | Freq: Every day | ORAL | 1 refills | Status: AC

## 2023-03-23 MED ORDER — VERAPAMIL HCL 2.5 MG/ML IV SOLN
INTRAVENOUS | Status: AC
  Filled 2023-03-23: qty 2

## 2023-03-23 MED ORDER — HEPARIN SODIUM (PORCINE) 1000 UNIT/ML IJ SOLN
INTRAMUSCULAR | Status: AC
  Filled 2023-03-23: qty 10

## 2023-03-23 MED ORDER — MIDAZOLAM HCL 2 MG/2ML IJ SOLN
INTRAMUSCULAR | Status: AC
  Filled 2023-03-23: qty 2

## 2023-03-23 MED ORDER — SODIUM CHLORIDE 0.9% FLUSH: 3.0000 mL | INTRAVENOUS | Status: DC | PRN

## 2023-03-23 MED ORDER — HEPARIN SODIUM (PORCINE) 1000 UNIT/ML IJ SOLN
INTRAMUSCULAR | Status: DC | PRN
  Administered 2023-03-23: 6000 [IU] via INTRAVENOUS

## 2023-03-23 MED ORDER — SODIUM CHLORIDE 0.9 % WEIGHT BASED INFUSION: 1.0000 mL/kg/h | INTRAVENOUS | Status: DC

## 2023-03-23 SURGICAL SUPPLY — 7 items
CATH 5FR JL3.5 JR4 ANG PIG MP (CATHETERS) IMPLANT
DEVICE RAD COMP TR BAND LRG (VASCULAR PRODUCTS) IMPLANT
GLIDESHEATH SLEND SS 6F .021 (SHEATH) IMPLANT
GUIDEWIRE INQWIRE 1.5J.035X260 (WIRE) IMPLANT
INQWIRE 1.5J .035X260CM (WIRE) ×1
PACK CARDIAC CATHETERIZATION (CUSTOM PROCEDURE TRAY) ×1 IMPLANT
SET ATX-X65L (MISCELLANEOUS) IMPLANT

## 2023-03-23 NOTE — Progress Notes (Signed)
PROGRESS NOTE    Raymond Harrell  ZOX:[id number] DOB: Apr 14, 1966 DOA: 03/20/2023 PCP: Gaspar Garbe, MD  Subjective: Pt seen and examined.  Patient getting cleaned up with the help of his wife.  Has not gone yet for his left heart cath.   Hospital Course: HPI: Raymond Harrell is a 57 y.o. male with medical history significant of hypertension, type 2 diabetes, hyperlipidemia presented to ED with complaint of right-sided chest pain radiating to his back x 3 days.  EKG without acute ischemic changes.  Troponin 23> 20.  CTA chest/abdomen/pelvis negative for aortic dissection. EDP discussed with on-call cardiologist Dr. Aron Baba who recommended admission for observation since patient has risk factors for CAD and mild coronary artery calcification seen on CT. cardiology has ordered coronary CTA.  Patient was given morphine and sublingual nitroglycerin.  TRH called to admit.   Patient is reporting 3-day history of intermittent sharp right-sided back pain which radiates to his chest.  No associated dyspnea, diaphoresis, nausea, or vomiting.  He denies history of CAD or blood clots in the past.  Denies any injuries to his back or chest.  Denies lifting any heavy weights.  His chest pain has resolved after he received morphine and sublingual nitroglycerin in the ED. No other complaints.  Significant Events: Admitted 03/20/2023 for chest pain   Significant Labs: Troponin 20->23 TC 138, TG 82, HDL 61, LDL 61 A1C 7.1%  Significant Imaging Studies: CTA chest/abd shows 1. No acute aortic syndrome. 2. No acute abnormality in the chest, abdomen, or pelvis.  Antibiotic Therapy: Anti-infectives (From admission, onward)    None       Procedures:   Consultants: cardiology    Assessment and Plan: * Chest pain Admitted for chest pain. Troponin 20->23. Pt seen by cardiology. Echo shows no wall motion abnormality.   EKG shows new TWI in V4-V6 that was not present in  01-2021  LHC today    Type 2 diabetes mellitus without complication, without long-term current use of insulin (HCC) A1C 7.1%. only on metformin. Trying to control his CBG with diet and exercise. Will need f/u with PCP to discuss further intervention.  On SSI.  Dyslipidemia Remains on crestor 40 mg every day.  Unstable angina [facility]) Troponin 20->23  Cardiology planning on LHC today  Essential hypertension Continue lisinopril 20 mg daily.  03-22-2023. Cardiology added Imdur to his HTN meds today.       DVT prophylaxis: enoxaparin (LOVENOX) injection 40 mg Start: 03/21/23 1000     Code Status: Full Code Family Communication: discussed with pt and wife at bedside Disposition Plan: return home Reason for continuing need for hospitalization: LHC today. May be able to be discharged depending on results of LHC.  Objective: Vitals:   03/22/23 2020 03/23/23 0505 03/23/23 0845 03/23/23 1242  BP: 123/65 139/75 120/77   Pulse: 81 84 89   Resp: 18 18  16   Temp: 98.3 F (36.8 C) 99.2 F (37.3 C)  98.9 F (37.2 C)  TempSrc: Oral Oral  Oral  SpO2: 99% 98% 95%   Weight:      Height:        Intake/Output Summary (Last 24 hours) at 03/23/2023 1409 Last data filed at 03/23/2023 1148 Gross per 24 hour  Intake 629.87 ml  Output --  Net 629.87 ml   [employer]   03/20/23 1657 03/22/23 1924  Weight: 118.1 kg 118.1 kg    Examination:  Physical Exam Vitals and nursing note reviewed.  Constitutional:  General: He is not in acute distress.    Appearance: He is normal weight. He is not toxic-appearing or diaphoretic.  HENT:     Head: Normocephalic and atraumatic.     Nose: Nose normal.  Cardiovascular:     Rate and Rhythm: Normal rate and regular rhythm.  Pulmonary:     Effort: Pulmonary effort is normal.     Breath sounds: Normal breath sounds.  Abdominal:     General: Bowel sounds are normal. There is no distension.     Palpations: Abdomen is soft.      Tenderness: There is no abdominal tenderness.  Musculoskeletal:     Right lower leg: No edema.     Left lower leg: No edema.  Skin:    General: Skin is warm and dry.  Neurological:     General: No focal deficit present.     Mental Status: He is alert and oriented to person, place, and time.   Data Reviewed: I have personally reviewed following labs and imaging studies  CBC: Recent Labs  Lab 03/20/23 1726 03/23/23 0545  WBC 6.6 6.2  HGB 13.3 12.8*  HCT 42.0 41.0  MCV 81.4 79.6*  PLT 215 194   Basic Metabolic Panel: Recent Labs  Lab 03/20/23 1726 03/23/23 0545  NA 134* 137  K 3.8 4.4  CL 99 102  CO2 21* 22  GLUCOSE 106* 151*  BUN 17 14  CREATININE 0.93 1.05  CALCIUM 9.7 9.4  MG  --  1.9   GFR: Estimated Creatinine Clearance: 109 mL/min (by C-G formula based on SCr of 1.05 mg/dL).  HbA1C: Recent Labs    03/21/23 0839  HGBA1C 7.1*   CBG: Recent Labs  Lab 03/22/23 1546 03/22/23 1738 03/22/23 2117 03/23/23 0825 03/23/23 1148  GLUCAP 170* 163* 155* 130* 110*   Lipid Profile: Recent Labs    03/21/23 0839  CHOL 138  HDL 61  LDLCALC 61  TRIG 82  CHOLHDL 2.3   Thyroid Function Tests: Recent Labs    03/23/23 0545  TSH 1.554   Radiology Studies: No results found.  Scheduled Meds:  aspirin EC  81 mg Oral Daily   enoxaparin (LOVENOX) injection  40 mg Subcutaneous Q24H   insulin aspart  0-5 Units Subcutaneous QHS   insulin aspart  0-9 Units Subcutaneous TID WC   isosorbide mononitrate  30 mg Oral Daily   lisinopril  20 mg Oral Daily   rosuvastatin  40 mg Oral Daily   Continuous Infusions:  sodium chloride 1 mL/kg/hr (03/23/23 1217)     LOS: 0 days   Time spent: 30 minutes  Carollee Herter, DO  [facility]  03/23/2023, 2:09 PM

## 2023-03-23 NOTE — Assessment & Plan Note (Signed)
03-23-2023 LHC showed 1st Diag lesion is 30% stenosed.   Stay on ASA, statin. No indication for imdur.

## 2023-03-23 NOTE — Interval H&P Note (Signed)
History and Physical Interval Note:  03/23/2023 4:14 PM  Raymond Harrell  has presented today for surgery, with the diagnosis of unstable angina.  The various methods of treatment have been discussed with the patient and family. After consideration of risks, benefits and other options for treatment, the patient has consented to  Procedure(s): LEFT HEART CATH AND CORONARY ANGIOGRAPHY (N/A) as a surgical intervention.  The patient's history has been reviewed, patient examined, no change in status, stable for surgery.  I have reviewed the patient's chart and labs.  Questions were answered to the patient's satisfaction.   Cath Lab Visit (complete for each Cath Lab visit)  Clinical Evaluation Leading to the Procedure:   ACS: Yes.    Non-ACS:    Anginal Classification: CCS III  Anti-ischemic medical therapy: Minimal Therapy (1 class of medications)  Non-Invasive Test Results: No non-invasive testing performed  Prior CABG: No previous CABG        Theron Arista [facility] 03/23/2023 4:14 PM

## 2023-03-23 NOTE — Progress Notes (Signed)
  Received page from bedside nurse that patient has been discharged in the evening however Imdur needs to be prescribed as per cardiology recommendation.   - I was able to prescribe Imdur 30 mg for the discharge.  Tereasa Coop, MD [facility] 03/23/2023, 8:42 PM

## 2023-03-23 NOTE — TOC CM/SW Note (Signed)
Transition of Care [facility]) - Inpatient Brief Assessment   Patient Details  Name: Raymond Harrell MRN: [id number] Date of Birth: 08-Aug-1965  Transition of Care [facility]) CM/SW Contact:    Gala Lewandowsky, RN Phone Number: 03/23/2023, 3:50 PM   Clinical Narrative: Patient presented for chest pain-plan for LHC. PTA patient was independent from home with spouse. No home needs identified at the time of visit. Case Manager will continue to follow for additional transition of care needs as the patient progresses.    Transition of Care Asessment: Insurance and Status: Insurance coverage has been reviewed Patient has primary care physician: Yes Home environment has been reviewed: reviewed Prior level of function:: independent Prior/Current Home Services: No current home services Social Determinants of Health Reivew: SDOH reviewed no interventions necessary Readmission risk has been reviewed: Yes Transition of care needs: no transition of care needs at this time

## 2023-03-23 NOTE — Progress Notes (Addendum)
Progress Note  Patient Name: Raymond Harrell Date of Encounter: 03/23/2023  Primary Cardiologist: Parke Poisson, MD  Subjective   Last episode of CP yesterday. No CP overnight. No recent palpitations. Does snore at night.  Inpatient Medications    Scheduled Meds:  aspirin EC  81 mg Oral Daily   enoxaparin (LOVENOX) injection  40 mg Subcutaneous Q24H   insulin aspart  0-5 Units Subcutaneous QHS   insulin aspart  0-9 Units Subcutaneous TID WC   isosorbide mononitrate  30 mg Oral Daily   lisinopril  20 mg Oral Daily   rosuvastatin  40 mg Oral Daily   Continuous Infusions:  sodium chloride 1 mL/kg/hr (03/23/23 0628)   PRN Meds: acetaminophen **OR** acetaminophen   Vital Signs    Vitals:   03/22/23 1547 03/22/23 1924 03/22/23 2020 03/23/23 0505  BP: 119/62  123/65 139/75  Pulse: 92  81 84  Resp: 18  18 18   Temp: 98.3 F (36.8 C)  98.3 F (36.8 C) 99.2 F (37.3 C)  TempSrc: Oral  Oral Oral  SpO2: 93%  99% 98%  Weight:  118.1 kg    Height:        Intake/Output Summary (Last 24 hours) at 03/23/2023 0754 Last data filed at 03/22/2023 0900 Gross per 24 hour  Intake 180 ml  Output --  Net 180 ml      03/22/2023    7:24 PM 03/20/2023    4:57 PM 01/07/2021    3:11 PM  Last 3 Weights  Weight (lbs) 260 lb 5.8 oz 260 lb 5.8 oz 260 lb 6.4 oz  Weight (kg) 118.1 kg 118.1 kg 118.117 kg     Telemetry    NSR, brief episode of transient irregular narrow complex tachycardia 4:28am <30 sec - Personally Reviewed  Physical Exam   GEN: No acute distress.  HEENT: Normocephalic, atraumatic, sclera non-icteric. Neck: No JVD or bruits. Cardiac: RRR no murmurs, rubs, or gallops.  Respiratory: Clear to auscultation bilaterally. Breathing is unlabored. GI: Soft, nontender, non-distended, BS +x 4. MS: no deformity. Extremities: No clubbing or cyanosis. No edema. Distal pedal pulses are 2+ and equal bilaterally. Neuro:  AAOx3. Follows commands. Psych:  Responds to  questions appropriately with a normal affect.  Labs    High Sensitivity Troponin:   Recent Labs  Lab 03/20/23 1726 03/20/23 1941  TROPONINIHS 23* 20*      Cardiac EnzymesNo results for input(s): "TROPONINI" in the last 168 hours. No results for input(s): "TROPIPOC" in the last 168 hours.   Chemistry Recent Labs  Lab 03/20/23 1726 03/23/23 0545  NA 134* 137  K 3.8 4.4  CL 99 102  CO2 21* 22  GLUCOSE 106* 151*  BUN 17 14  CREATININE 0.93 1.05  CALCIUM 9.7 9.4  GFRNONAA >60 >60  ANIONGAP 14 13     Hematology Recent Labs  Lab 03/20/23 1726 03/23/23 0545  WBC 6.6 6.2  RBC 5.16 5.15  HGB 13.3 12.8*  HCT 42.0 41.0  MCV 81.4 79.6*  MCH 25.8* 24.9*  MCHC 31.7 31.2  RDW 14.9 14.7  PLT 215 194    BNPNo results for input(s): "BNP", "PROBNP" in the last 168 hours.   DDimer No results for input(s): "DDIMER" in the last 168 hours.   Radiology    ECHOCARDIOGRAM COMPLETE  Result Date: 03/21/2023    ECHOCARDIOGRAM REPORT   Patient Name:   Raymond Harrell Date of Exam: 03/21/2023 Medical Rec #:  [id number]  Height:       76.0 in Accession #:    [id number]            Weight:       260.4 lb Date of Birth:  1965/08/16             BSA:          2.478 m Patient Age:    57 years              BP:           132/83 mmHg Patient Gender: M                     HR:           75 bpm. Exam Location:  Inpatient Procedure: 2D Echo, Color Doppler, Cardiac Doppler and Intracardiac            Opacification Agent Indications:    R07.9* Chest pain, unspecified  History:        Patient has prior history of Echocardiogram examinations, most                 recent 02/18/2012. CAD; Risk Factors:Hypertension, Dyslipidemia                 and Diabetes.  Sonographer:    Irving Burton Senior RDCS Referring Phys: [id number] Kathryne Sharper A WILMOT IMPRESSIONS  1. Left ventricular ejection fraction, by estimation, is 55%. The left ventricle has normal function. The left ventricle has no regional wall motion  abnormalities. There is moderate asymmetric left ventricular hypertrophy of the septal segment. Left ventricular diastolic parameters are consistent with Grade I diastolic dysfunction (impaired relaxation).  2. Right ventricular systolic function is normal. The right ventricular size is normal. Tricuspid regurgitation signal is inadequate for assessing PA pressure.  3. The mitral valve is normal in structure. No evidence of mitral valve regurgitation. No evidence of mitral stenosis.  4. The aortic valve is normal in structure. Aortic valve regurgitation is not visualized. No aortic stenosis is present.  5. The inferior vena cava is normal in size with greater than 50% respiratory variability, suggesting right atrial pressure of 3 mmHg. FINDINGS  Left Ventricle: Left ventricular ejection fraction, by estimation, is 55%. The left ventricle has normal function. The left ventricle has no regional wall motion abnormalities. Definity contrast agent was given IV to delineate the left ventricular endocardial borders. The left ventricular internal cavity size was normal in size. There is moderate asymmetric left ventricular hypertrophy of the septal segment. Left ventricular diastolic parameters are consistent with Grade I diastolic dysfunction (impaired relaxation). Right Ventricle: The right ventricular size is normal. No increase in right ventricular wall thickness. Right ventricular systolic function is normal. Tricuspid regurgitation signal is inadequate for assessing PA pressure. Left Atrium: Left atrial size was normal in size. Right Atrium: Right atrial size was normal in size. Pericardium: There is no evidence of pericardial effusion. Mitral Valve: The mitral valve is normal in structure. No evidence of mitral valve regurgitation. No evidence of mitral valve stenosis. Tricuspid Valve: The tricuspid valve is normal in structure. Tricuspid valve regurgitation is not demonstrated. No evidence of tricuspid stenosis.  Aortic Valve: The aortic valve is normal in structure. Aortic valve regurgitation is not visualized. No aortic stenosis is present. Pulmonic Valve: The pulmonic valve was normal in structure. Pulmonic valve regurgitation is trivial. No evidence of pulmonic stenosis. Aorta: The aortic root is normal in size and structure. Venous: The inferior vena  cava is normal in size with greater than 50% respiratory variability, suggesting right atrial pressure of 3 mmHg. IAS/Shunts: The atrial septum is grossly normal.  LEFT VENTRICLE PLAX 2D LVIDd:         4.20 cm   Diastology LVIDs:         2.60 cm   LV e' medial:    5.87 cm/s LV PW:         1.20 cm   LV E/e' medial:  9.4 LV IVS:        1.40 cm   LV e' lateral:   8.38 cm/s LVOT diam:     2.20 cm   LV E/e' lateral: 6.6 LV SV:         71 LV SV Index:   29 LVOT Area:     3.80 cm  RIGHT VENTRICLE RV S prime:     14.90 cm/s TAPSE (M-mode): 2.6 cm LEFT ATRIUM             Index        RIGHT ATRIUM           Index LA diam:        3.00 cm 1.21 cm/m   RA Area:     19.30 cm LA Vol (A2C):   63.5 ml 25.63 ml/m  RA Volume:   52.90 ml  21.35 ml/m LA Vol (A4C):   56.7 ml 22.88 ml/m LA Biplane Vol: 63.5 ml 25.63 ml/m  AORTIC VALVE LVOT Vmax:   99.20 cm/s LVOT Vmean:  64.200 cm/s LVOT VTI:    0.187 m  AORTA Ao Root diam: 3.10 cm Ao Asc diam:  3.40 cm MITRAL VALVE MV Area (PHT): 2.99 cm    SHUNTS MV Decel Time: 254 msec    Systemic VTI:  0.19 m MV E velocity: 55.30 cm/s  Systemic Diam: 2.20 cm MV A velocity: 70.30 cm/s MV E/A ratio:  0.79 Weston Brass MD Electronically signed by Weston Brass MD Signature Date/Time: 03/21/2023/8:43:27 AM    Final     Cardiac Studies   2D echo 03/21/23   1. Left ventricular ejection fraction, by estimation, is 55%. The left  ventricle has normal function. The left ventricle has no regional wall  motion abnormalities. There is moderate asymmetric left ventricular  hypertrophy of the septal segment. Left  ventricular diastolic parameters are  consistent with Grade I diastolic  dysfunction (impaired relaxation).   2. Right ventricular systolic function is normal. The right ventricular  size is normal. Tricuspid regurgitation signal is inadequate for assessing  PA pressure.   3. The mitral valve is normal in structure. No evidence of mitral valve  regurgitation. No evidence of mitral stenosis.   4. The aortic valve is normal in structure. Aortic valve regurgitation is  not visualized. No aortic stenosis is present.   5. The inferior vena cava is normal in size with greater than 50%  respiratory variability, suggesting right atrial pressure of 3 mmHg.   Patient Profile     57 y.o. male with HTN, DM2, dyslipidemia, family history of premature coronary artery disease presented with 2-day history of intermittent chest pain and low level troponin elevation.  Assessment & Plan    1. Chest pain/possible unstable angina - hsTroponins low/flat 23-20 - continue ASA - continue new Imdur 30mg  daily - continue rosuvastatin - echo with EF 55%, G1DD, moderate asymmetric septal LVH - holding off heparin in setting of low level trops and resolution of chest pain; would revisit if  chest pain recurs - plan cath today as previously outlined, consent discussed in 10/13 rounding note  2. HTN - continue on home lisinopril, BPs acceptable  3. Dyslipidemia - on rosuvastatin 40mg  daily - LDL 61, continue  4. Diabetes mellitus, A1c 7.1 - on SSI for now - further per primary  5. Moderate asymmetric LVH of septal segment - question due to hx of HTN, no gradient noted  6. Transient irregular tachycardia - occurring at 4:28am, cannot fully exclude brief PAF (<30 sec) - does snore at night so consider OP OSA eval - will review strips with MD - add TSH/Mg  For questions or updates, please contact [facility] Please consult [email or web address] for contact info under Cardiology/STEMI.  Signed, Laurann Montana, PA-C 03/23/2023, 7:54 AM     History and all data above reviewed.  Patient examined.  I agree with the findings as above. No chest pain since yesterday.  No SOB.  The patient exam reveals COR:RRR  ,  Lungs: Clear  ,  Abd: Positive bowel sounds, no rebound no guarding, Ext No edema  .  All available labs, radiology testing, previous records reviewed. Agree with documented assessment and plan.   Chest pain:  Cath today as above.    Arrhythmia:  I reviewed. I will call this MAT.  No change in therapy.  The patient did not feel this.    Fayrene Fearing [facility]  10:43 AM  03/23/2023

## 2023-03-23 NOTE — Discharge Summary (Signed)
[facility] Physician Discharge Summary   Patient name: Raymond Harrell  Admit date:     03/20/2023  Discharge date: 03/23/2023  Attending Physician: John Giovanni [[id number]]  Discharge Physician: Carollee Herter   PCP: Gaspar Garbe, MD  Admitted From: Home  Disposition:  Home  Recommendations for Outpatient Follow-up:  Follow up with PCP in 1-2 weeks  Home Health:No Equipment/Devices: None  Discharge Condition:Stable CODE STATUS:FULL Diet recommendation: Heart Healthy Fluid Restriction: None  Hospital Summary: HPI: Raymond Harrell is a 57 y.o. male with medical history significant of hypertension, type 2 diabetes, hyperlipidemia presented to ED with complaint of right-sided chest pain radiating to his back x 3 days.  EKG without acute ischemic changes.  Troponin 23> 20.  CTA chest/abdomen/pelvis negative for aortic dissection. EDP discussed with on-call cardiologist Dr. Aron Baba who recommended admission for observation since patient has risk factors for CAD and mild coronary artery calcification seen on CT. cardiology has ordered coronary CTA.  Patient was given morphine and sublingual nitroglycerin.  TRH called to admit.   Patient is reporting 3-day history of intermittent sharp right-sided back pain which radiates to his chest.  No associated dyspnea, diaphoresis, nausea, or vomiting.  He denies history of CAD or blood clots in the past.  Denies any injuries to his back or chest.  Denies lifting any heavy weights.  His chest pain has resolved after he received morphine and sublingual nitroglycerin in the ED. No other complaints.  Significant Events: Admitted 03/20/2023 for chest pain 03-23-2023 LHC showed 1st Diag lesion is 30% stenosed.   Significant Labs: Troponin 20->23 TC 138, TG 82, HDL 61, LDL 61 A1C 7.1%  Significant Imaging Studies: CTA chest/abd shows 1. No acute aortic syndrome. 2. No acute abnormality in the chest, abdomen, or  pelvis. 03-21-2023 echo shows LVEF normal at 55%. No wall motion abnormalities. Moderate asymmetric LVH.  Antibiotic Therapy: Anti-infectives (From admission, onward)    None       Procedures: 03-23-2023 LHC showed 1st Diag lesion is 30% stenosed.   Consultants: cardiology   Hospital Course by Problem: * Chest pain Admitted for chest pain. Troponin 20->23. Pt seen by cardiology. Echo shows no wall motion abnormality.   EKG shows new TWI in V4-V6 that was not present in 01-2021  LHC today    Type 2 diabetes mellitus without complication, without long-term current use of insulin (HCC) A1C 7.1%. only on metformin. Trying to control his CBG with diet and exercise. Will need f/u with PCP to discuss further intervention.  On SSI.  Dyslipidemia Remains on crestor 40 mg every day.  Unstable angina [facility]) Troponin 20->23  Cardiology planning on LHC today  Mild CAD 03-23-2023 LHC showed 1st Diag lesion is 30% stenosed.   Stay on ASA, statin. No indication for imdur.  Essential hypertension Continue lisinopril 20 mg daily.  03-22-2023. Cardiology added Imdur to his HTN meds today.    Discharge Diagnoses:  Principal Problem:   Chest pain Active Problems:   Unstable angina (HCC)   Dyslipidemia   Type 2 diabetes mellitus without complication, without long-term current use of insulin (HCC)   Essential hypertension   Mild CAD   Discharge Instructions  Discharge Instructions     Call MD for:  difficulty breathing, headache or visual disturbances   Complete by: As directed    Call MD for:  extreme fatigue   Complete by: As directed    Call MD for:  hives   Complete by: As directed  Call MD for:  persistant dizziness or light-headedness   Complete by: As directed    Call MD for:  persistant nausea and vomiting   Complete by: As directed    Call MD for:  redness, tenderness, or signs of infection (pain, swelling, redness, odor or green/yellow discharge  around incision site)   Complete by: As directed    Call MD for:  temperature >100.4   Complete by: As directed    Diet - low sodium heart healthy   Complete by: As directed    Diet Carb Modified   Complete by: As directed    Discharge instructions   Complete by: As directed    1. Followup with your primary care physician in 1-2 weeks following discharge from the hospital   Increase activity slowly   Complete by: As directed       Allergies as of 03/23/2023       Reactions   Other Shortness Of Breath, Swelling, Other (See Comments)   Spider venom = Dizziness, tight throat, bruising, and swelling- Required HOSPITALIZATION in 2022        Medication List     TAKE these medications    acetaminophen 500 MG tablet Commonly known as: TYLENOL Take 1,000 mg by mouth every 6 (six) hours as needed (for pain.).   ascorbic acid 500 MG tablet Commonly known as: VITAMIN C Take 500 mg by mouth daily as needed.   aspirin EC 81 MG tablet Take 81 mg by mouth daily.   Fish Oil 500 MG Caps Take 500 mg by mouth daily.   Garlic 1000 MG Caps Take 1,000 mg by mouth daily.   lisinopril 20 MG tablet Commonly known as: ZESTRIL Take 1 tablet (20 mg total) by mouth daily.   metFORMIN 500 MG tablet Commonly known as: GLUCOPHAGE Take 500 mg by mouth 2 (two) times daily.   multivitamin with minerals Tabs tablet Take 1 tablet by mouth daily.   rosuvastatin 40 MG tablet Commonly known as: CRESTOR Take 40 mg by mouth daily.        Allergies  Allergen Reactions   Other Shortness Of Breath, Swelling and Other (See Comments)    Spider venom = Dizziness, tight throat, bruising, and swelling- Required HOSPITALIZATION in 2022    Discharge Exam: Vitals:   03/23/23 1641 03/23/23 1656  BP:  114/72  Pulse: (!) 0 82  Resp:  18  Temp:  98.7 F (37.1 C)  SpO2: 95% 96%    Physical Exam Vitals and nursing note reviewed.  Constitutional:      General: He is not in acute distress.     Appearance: He is normal weight. He is not toxic-appearing or diaphoretic.  HENT:     Head: Normocephalic and atraumatic.     Nose: Nose normal.  Cardiovascular:     Rate and Rhythm: Normal rate and regular rhythm.  Pulmonary:     Effort: Pulmonary effort is normal.     Breath sounds: Normal breath sounds.  Abdominal:     General: Bowel sounds are normal. There is no distension.     Palpations: Abdomen is soft.     Tenderness: There is no abdominal tenderness.  Musculoskeletal:     Right lower leg: No edema.     Left lower leg: No edema.  Skin:    General: Skin is warm and dry.  Neurological:     General: No focal deficit present.     Mental Status: He is alert and oriented  to person, place, and time.     The results of significant diagnostics from this hospitalization (including imaging, microbiology, ancillary and laboratory) are listed below for reference.    Basic Metabolic Panel: Recent Labs  Lab 03/20/23 1726 03/23/23 0545  NA 134* 137  K 3.8 4.4  CL 99 102  CO2 21* 22  GLUCOSE 106* 151*  BUN 17 14  CREATININE 0.93 1.05  CALCIUM 9.7 9.4  MG  --  1.9   CBC: Recent Labs  Lab 03/20/23 1726 03/23/23 0545  WBC 6.6 6.2  HGB 13.3 12.8*  HCT 42.0 41.0  MCV 81.4 79.6*  PLT 215 194   CBG: Recent Labs  Lab 03/22/23 1738 03/22/23 2117 03/23/23 0825 03/23/23 1148 03/23/23 1707  GLUCAP 163* 155* 130* 110* 88    Hgb A1c Recent Labs    03/21/23 0839  HGBA1C 7.1*   Lipid Profile Recent Labs    03/21/23 0839  CHOL 138  HDL 61  LDLCALC 61  TRIG 82  CHOLHDL 2.3   Thyroid function studies Recent Labs    03/23/23 0545  TSH 1.554   Sepsis Labs Recent Labs  Lab 03/20/23 1726 03/23/23 0545  WBC 6.6 6.2    Procedures/Studies: CARDIAC CATHETERIZATION  Result Date: 03/23/2023   1st Diag lesion is 30% stenosed.   LV end diastolic pressure is normal. Very mild diffuse coronary irregularities without obstructive disease Normal LVEDP 9 mm Hg  Plan: risk factor modification   ECHOCARDIOGRAM COMPLETE  Result Date: 03/21/2023    ECHOCARDIOGRAM REPORT   Patient Name:   Raymond Harrell Date of Exam: 03/21/2023 Medical Rec #:  [id number]             Height:       76.0 in Accession #:    [id number]            Weight:       260.4 lb Date of Birth:  [birth year]             BSA:          2.478 m Patient Age:    57 years              BP:           132/83 mmHg Patient Gender: M                     HR:           75 bpm. Exam Location:  Inpatient Procedure: 2D Echo, Color Doppler, Cardiac Doppler and Intracardiac            Opacification Agent Indications:    R07.9* Chest pain, unspecified  History:        Patient has prior history of Echocardiogram examinations, most                 recent 02/18/2012. CAD; Risk Factors:Hypertension, Dyslipidemia                 and Diabetes.  Sonographer:    Irving Burton Senior RDCS Referring Phys: [id number] Kathryne Sharper A WILMOT IMPRESSIONS  1. Left ventricular ejection fraction, by estimation, is 55%. The left ventricle has normal function. The left ventricle has no regional wall motion abnormalities. There is moderate asymmetric left ventricular hypertrophy of the septal segment. Left ventricular diastolic parameters are consistent with Grade I diastolic dysfunction (impaired relaxation).  2. Right ventricular systolic function is normal. The right ventricular size is normal. Tricuspid regurgitation signal is  inadequate for assessing PA pressure.  3. The mitral valve is normal in structure. No evidence of mitral valve regurgitation. No evidence of mitral stenosis.  4. The aortic valve is normal in structure. Aortic valve regurgitation is not visualized. No aortic stenosis is present.  5. The inferior vena cava is normal in size with greater than 50% respiratory variability, suggesting right atrial pressure of 3 mmHg. FINDINGS  Left Ventricle: Left ventricular ejection fraction, by estimation, is 55%. The left ventricle has normal  function. The left ventricle has no regional wall motion abnormalities. Definity contrast agent was given IV to delineate the left ventricular endocardial borders. The left ventricular internal cavity size was normal in size. There is moderate asymmetric left ventricular hypertrophy of the septal segment. Left ventricular diastolic parameters are consistent with Grade I diastolic dysfunction (impaired relaxation). Right Ventricle: The right ventricular size is normal. No increase in right ventricular wall thickness. Right ventricular systolic function is normal. Tricuspid regurgitation signal is inadequate for assessing PA pressure. Left Atrium: Left atrial size was normal in size. Right Atrium: Right atrial size was normal in size. Pericardium: There is no evidence of pericardial effusion. Mitral Valve: The mitral valve is normal in structure. No evidence of mitral valve regurgitation. No evidence of mitral valve stenosis. Tricuspid Valve: The tricuspid valve is normal in structure. Tricuspid valve regurgitation is not demonstrated. No evidence of tricuspid stenosis. Aortic Valve: The aortic valve is normal in structure. Aortic valve regurgitation is not visualized. No aortic stenosis is present. Pulmonic Valve: The pulmonic valve was normal in structure. Pulmonic valve regurgitation is trivial. No evidence of pulmonic stenosis. Aorta: The aortic root is normal in size and structure. Venous: The inferior vena cava is normal in size with greater than 50% respiratory variability, suggesting right atrial pressure of 3 mmHg. IAS/Shunts: The atrial septum is grossly normal.  LEFT VENTRICLE PLAX 2D LVIDd:         4.20 cm   Diastology LVIDs:         2.60 cm   LV e' medial:    5.87 cm/s LV PW:         1.20 cm   LV E/e' medial:  9.4 LV IVS:        1.40 cm   LV e' lateral:   8.38 cm/s LVOT diam:     2.20 cm   LV E/e' lateral: 6.6 LV SV:         71 LV SV Index:   29 LVOT Area:     3.80 cm  RIGHT VENTRICLE RV S prime:      14.90 cm/s TAPSE (M-mode): 2.6 cm LEFT ATRIUM             Index        RIGHT ATRIUM           Index LA diam:        3.00 cm 1.21 cm/m   RA Area:     19.30 cm LA Vol (A2C):   63.5 ml 25.63 ml/m  RA Volume:   52.90 ml  21.35 ml/m LA Vol (A4C):   56.7 ml 22.88 ml/m LA Biplane Vol: 63.5 ml 25.63 ml/m  AORTIC VALVE LVOT Vmax:   99.20 cm/s LVOT Vmean:  64.200 cm/s LVOT VTI:    0.187 m  AORTA Ao Root diam: 3.10 cm Ao Asc diam:  3.40 cm MITRAL VALVE MV Area (PHT): 2.99 cm    SHUNTS MV Decel Time: 254 msec    Systemic  VTI:  0.19 m MV E velocity: 55.30 cm/s  Systemic Diam: 2.20 cm MV A velocity: 70.30 cm/s MV E/A ratio:  0.79 Weston Brass MD Electronically signed by Weston Brass MD Signature Date/Time: 03/21/2023/8:43:27 AM    Final    CT Angio Chest/Abd/Pel for Dissection W and/or W/WO  Result Date: 03/20/2023 CLINICAL DATA:  Chest pain since Wednesday. Aortic aneurysm suspected EXAM: CT ANGIOGRAPHY CHEST, ABDOMEN AND PELVIS TECHNIQUE: Non-contrast CT of the chest was initially obtained. Multidetector CT imaging through the chest, abdomen and pelvis was performed using the standard protocol during bolus administration of intravenous contrast. Multiplanar reconstructed images and MIPs were obtained and reviewed to evaluate the vascular anatomy. RADIATION DOSE REDUCTION: This exam was performed according to the departmental dose-optimization program which includes automated exposure control, adjustment of the mA and/or kV according to patient size and/or use of iterative reconstruction technique. CONTRAST:  OMNIPAQUE IOHEXOL 350 MG/ML SOLN COMPARISON:  Chest radiographs earlier today; CT abdomen and pelvis 12/19/2018 and CTA chest 02/18/2012 FINDINGS: CTA CHEST FINDINGS Cardiovascular: No acute aortic syndrome. Normal heart size. No pericardial effusion. Mild coronary artery and aortic atherosclerotic calcification. Mediastinum/Nodes: Trachea and esophagus are unremarkable. No thoracic adenopathy.  Lungs/Pleura: Scarring, consolidation, and bronchiectasis in the right upper lobe stable since 2013. The lungs are otherwise clear. No pleural effusion or pneumothorax. Musculoskeletal: No acute fracture. Review of the MIP images confirms the above findings. CTA ABDOMEN AND PELVIS FINDINGS VASCULAR Normal caliber abdominal aorta without aneurysm or dissection. Atherosclerotic plaque. The mesenteric and renal arteries are widely patent without aneurysm or dissection. Patent inflow and outflow arteries without aneurysm or dissection. Review of the MIP images confirms the above findings. NON-VASCULAR Hepatobiliary: Unremarkable liver. Normal gallbladder. No biliary dilation. Pancreas: Unremarkable. Spleen: Unremarkable. Adrenals/Urinary Tract: Normal adrenal glands. No urinary calculi or hydronephrosis. Bladder is unremarkable. Stomach/Bowel: Normal caliber large and small bowel. No bowel wall thickening. Appendectomy.Stomach is within normal limits. Vascular/Lymphatic: No significant vascular findings are present. No enlarged abdominal or pelvic lymph nodes. Reproductive: Unremarkable. Other: No free intraperitoneal fluid or air. Musculoskeletal: No acute fracture. Review of the MIP images confirms the above findings. IMPRESSION: 1. No acute aortic syndrome. 2. No acute abnormality in the chest, abdomen, or pelvis. Aortic Atherosclerosis (ICD10-I70.0). Electronically Signed   By: Minerva Fester M.D.   On: 03/20/2023 23:35   DG Chest 2 View  Result Date: 03/20/2023 CLINICAL DATA:  Chest pain EXAM: CHEST - 2 VIEW COMPARISON:  12/19/2018 FINDINGS: Right apical scarring, stable. Lungs otherwise clear. No effusions. Heart and mediastinal contours within normal limits. No acute bony abnormality. IMPRESSION: No active cardiopulmonary disease. Electronically Signed   By: Charlett Nose M.D.   On: 03/20/2023 19:12    Time coordinating discharge: 40 mins  SIGNED:  Carollee Herter, DO [facility] 03/23/23, 5:26  PM

## 2023-03-23 NOTE — H&P (View-Only) (Signed)
Progress Note  Patient Name: Raymond Harrell Date of Encounter: 03/23/2023  Primary Cardiologist: Parke Poisson, MD  Subjective   Last episode of CP yesterday. No CP overnight. No recent palpitations. Does snore at night.  Inpatient Medications    Scheduled Meds:  aspirin EC  81 mg Oral Daily   enoxaparin (LOVENOX) injection  40 mg Subcutaneous Q24H   insulin aspart  0-5 Units Subcutaneous QHS   insulin aspart  0-9 Units Subcutaneous TID WC   isosorbide mononitrate  30 mg Oral Daily   lisinopril  20 mg Oral Daily   rosuvastatin  40 mg Oral Daily   Continuous Infusions:  sodium chloride 1 mL/kg/hr (03/23/23 0628)   PRN Meds: acetaminophen **OR** acetaminophen   Vital Signs    Vitals:   03/22/23 1547 03/22/23 1924 03/22/23 2020 03/23/23 0505  BP: 119/62  123/65 139/75  Pulse: 92  81 84  Resp: 18  18 18   Temp: 98.3 F (36.8 C)  98.3 F (36.8 C) 99.2 F (37.3 C)  TempSrc: Oral  Oral Oral  SpO2: 93%  99% 98%  Weight:  118.1 kg    Height:        Intake/Output Summary (Last 24 hours) at 03/23/2023 0754 Last data filed at 03/22/2023 0900 Gross per 24 hour  Intake 180 ml  Output --  Net 180 ml      03/22/2023    7:24 PM 03/20/2023    4:57 PM 01/07/2021    3:11 PM  Last 3 Weights  Weight (lbs) 260 lb 5.8 oz 260 lb 5.8 oz 260 lb 6.4 oz  Weight (kg) 118.1 kg 118.1 kg 118.117 kg     Telemetry    NSR, brief episode of transient irregular narrow complex tachycardia 4:28am <30 sec - Personally Reviewed  Physical Exam   GEN: No acute distress.  HEENT: Normocephalic, atraumatic, sclera non-icteric. Neck: No JVD or bruits. Cardiac: RRR no murmurs, rubs, or gallops.  Respiratory: Clear to auscultation bilaterally. Breathing is unlabored. GI: Soft, nontender, non-distended, BS +x 4. MS: no deformity. Extremities: No clubbing or cyanosis. No edema. Distal pedal pulses are 2+ and equal bilaterally. Neuro:  AAOx3. Follows commands. Psych:  Responds to  questions appropriately with a normal affect.  Labs    High Sensitivity Troponin:   Recent Labs  Lab 03/20/23 1726 03/20/23 1941  TROPONINIHS 23* 20*      Cardiac EnzymesNo results for input(s): "TROPONINI" in the last 168 hours. No results for input(s): "TROPIPOC" in the last 168 hours.   Chemistry Recent Labs  Lab 03/20/23 1726 03/23/23 0545  NA 134* 137  K 3.8 4.4  CL 99 102  CO2 21* 22  GLUCOSE 106* 151*  BUN 17 14  CREATININE 0.93 1.05  CALCIUM 9.7 9.4  GFRNONAA >60 >60  ANIONGAP 14 13     Hematology Recent Labs  Lab 03/20/23 1726 03/23/23 0545  WBC 6.6 6.2  RBC 5.16 5.15  HGB 13.3 12.8*  HCT 42.0 41.0  MCV 81.4 79.6*  MCH 25.8* 24.9*  MCHC 31.7 31.2  RDW 14.9 14.7  PLT 215 194    BNPNo results for input(s): "BNP", "PROBNP" in the last 168 hours.   DDimer No results for input(s): "DDIMER" in the last 168 hours.   Radiology    ECHOCARDIOGRAM COMPLETE  Result Date: 03/21/2023    ECHOCARDIOGRAM REPORT   Patient Name:   Raymond Harrell Date of Exam: 03/21/2023 Medical Rec #:  [id number]  Height:       76.0 in Accession #:    [id number]            Weight:       260.4 lb Date of Birth:  1965/08/16             BSA:          2.478 m Patient Age:    57 years              BP:           132/83 mmHg Patient Gender: M                     HR:           75 bpm. Exam Location:  Inpatient Procedure: 2D Echo, Color Doppler, Cardiac Doppler and Intracardiac            Opacification Agent Indications:    R07.9* Chest pain, unspecified  History:        Patient has prior history of Echocardiogram examinations, most                 recent 02/18/2012. CAD; Risk Factors:Hypertension, Dyslipidemia                 and Diabetes.  Sonographer:    Irving Burton Senior RDCS Referring Phys: [id number] Kathryne Sharper A WILMOT IMPRESSIONS  1. Left ventricular ejection fraction, by estimation, is 55%. The left ventricle has normal function. The left ventricle has no regional wall motion  abnormalities. There is moderate asymmetric left ventricular hypertrophy of the septal segment. Left ventricular diastolic parameters are consistent with Grade I diastolic dysfunction (impaired relaxation).  2. Right ventricular systolic function is normal. The right ventricular size is normal. Tricuspid regurgitation signal is inadequate for assessing PA pressure.  3. The mitral valve is normal in structure. No evidence of mitral valve regurgitation. No evidence of mitral stenosis.  4. The aortic valve is normal in structure. Aortic valve regurgitation is not visualized. No aortic stenosis is present.  5. The inferior vena cava is normal in size with greater than 50% respiratory variability, suggesting right atrial pressure of 3 mmHg. FINDINGS  Left Ventricle: Left ventricular ejection fraction, by estimation, is 55%. The left ventricle has normal function. The left ventricle has no regional wall motion abnormalities. Definity contrast agent was given IV to delineate the left ventricular endocardial borders. The left ventricular internal cavity size was normal in size. There is moderate asymmetric left ventricular hypertrophy of the septal segment. Left ventricular diastolic parameters are consistent with Grade I diastolic dysfunction (impaired relaxation). Right Ventricle: The right ventricular size is normal. No increase in right ventricular wall thickness. Right ventricular systolic function is normal. Tricuspid regurgitation signal is inadequate for assessing PA pressure. Left Atrium: Left atrial size was normal in size. Right Atrium: Right atrial size was normal in size. Pericardium: There is no evidence of pericardial effusion. Mitral Valve: The mitral valve is normal in structure. No evidence of mitral valve regurgitation. No evidence of mitral valve stenosis. Tricuspid Valve: The tricuspid valve is normal in structure. Tricuspid valve regurgitation is not demonstrated. No evidence of tricuspid stenosis.  Aortic Valve: The aortic valve is normal in structure. Aortic valve regurgitation is not visualized. No aortic stenosis is present. Pulmonic Valve: The pulmonic valve was normal in structure. Pulmonic valve regurgitation is trivial. No evidence of pulmonic stenosis. Aorta: The aortic root is normal in size and structure. Venous: The inferior vena  cava is normal in size with greater than 50% respiratory variability, suggesting right atrial pressure of 3 mmHg. IAS/Shunts: The atrial septum is grossly normal.  LEFT VENTRICLE PLAX 2D LVIDd:         4.20 cm   Diastology LVIDs:         2.60 cm   LV e' medial:    5.87 cm/s LV PW:         1.20 cm   LV E/e' medial:  9.4 LV IVS:        1.40 cm   LV e' lateral:   8.38 cm/s LVOT diam:     2.20 cm   LV E/e' lateral: 6.6 LV SV:         71 LV SV Index:   29 LVOT Area:     3.80 cm  RIGHT VENTRICLE RV S prime:     14.90 cm/s TAPSE (M-mode): 2.6 cm LEFT ATRIUM             Index        RIGHT ATRIUM           Index LA diam:        3.00 cm 1.21 cm/m   RA Area:     19.30 cm LA Vol (A2C):   63.5 ml 25.63 ml/m  RA Volume:   52.90 ml  21.35 ml/m LA Vol (A4C):   56.7 ml 22.88 ml/m LA Biplane Vol: 63.5 ml 25.63 ml/m  AORTIC VALVE LVOT Vmax:   99.20 cm/s LVOT Vmean:  64.200 cm/s LVOT VTI:    0.187 m  AORTA Ao Root diam: 3.10 cm Ao Asc diam:  3.40 cm MITRAL VALVE MV Area (PHT): 2.99 cm    SHUNTS MV Decel Time: 254 msec    Systemic VTI:  0.19 m MV E velocity: 55.30 cm/s  Systemic Diam: 2.20 cm MV A velocity: 70.30 cm/s MV E/A ratio:  0.79 Weston Brass MD Electronically signed by Weston Brass MD Signature Date/Time: 03/21/2023/8:43:27 AM    Final     Cardiac Studies   2D echo 03/21/23   1. Left ventricular ejection fraction, by estimation, is 55%. The left  ventricle has normal function. The left ventricle has no regional wall  motion abnormalities. There is moderate asymmetric left ventricular  hypertrophy of the septal segment. Left  ventricular diastolic parameters are  consistent with Grade I diastolic  dysfunction (impaired relaxation).   2. Right ventricular systolic function is normal. The right ventricular  size is normal. Tricuspid regurgitation signal is inadequate for assessing  PA pressure.   3. The mitral valve is normal in structure. No evidence of mitral valve  regurgitation. No evidence of mitral stenosis.   4. The aortic valve is normal in structure. Aortic valve regurgitation is  not visualized. No aortic stenosis is present.   5. The inferior vena cava is normal in size with greater than 50%  respiratory variability, suggesting right atrial pressure of 3 mmHg.   Patient Profile     57 y.o. male with HTN, DM2, dyslipidemia, family history of premature coronary artery disease presented with 2-day history of intermittent chest pain and low level troponin elevation.  Assessment & Plan    1. Chest pain/possible unstable angina - hsTroponins low/flat 23-20 - continue ASA - continue new Imdur 30mg  daily - continue rosuvastatin - echo with EF 55%, G1DD, moderate asymmetric septal LVH - holding off heparin in setting of low level trops and resolution of chest pain; would revisit if  chest pain recurs - plan cath today as previously outlined, consent discussed in 10/13 rounding note  2. HTN - continue on home lisinopril, BPs acceptable  3. Dyslipidemia - on rosuvastatin 40mg  daily - LDL 61, continue  4. Diabetes mellitus, A1c 7.1 - on SSI for now - further per primary  5. Moderate asymmetric LVH of septal segment - question due to hx of HTN, no gradient noted  6. Transient irregular tachycardia - occurring at 4:28am, cannot fully exclude brief PAF (<30 sec) - does snore at night so consider OP OSA eval - will review strips with MD - add TSH/Mg  For questions or updates, please contact [facility] Please consult [email or web address] for contact info under Cardiology/STEMI.  Signed, Laurann Montana, PA-C 03/23/2023, 7:54 AM     History and all data above reviewed.  Patient examined.  I agree with the findings as above. No chest pain since yesterday.  No SOB.  The patient exam reveals COR:RRR  ,  Lungs: Clear  ,  Abd: Positive bowel sounds, no rebound no guarding, Ext No edema  .  All available labs, radiology testing, previous records reviewed. Agree with documented assessment and plan.   Chest pain:  Cath today as above.    Arrhythmia:  I reviewed. I will call this MAT.  No change in therapy.  The patient did not feel this.    Fayrene Fearing [facility]  10:43 AM  03/23/2023

## 2023-03-24 ENCOUNTER — Encounter: Payer: Self-pay | Admitting: Physician Assistant

## 2023-03-24 ENCOUNTER — Encounter (HOSPITAL_COMMUNITY): Payer: Self-pay | Admitting: Cardiology

## 2023-03-24 NOTE — Progress Notes (Signed)
Chart reviewed; patient was DC'd yesterday. Discussed plan with Dr. Antoine Poche with regard to very brief MAT seen on telemetry yesterday. He does not feel monitor currently warranted given brief nature and no association to symptoms but we will arrange f/u in our clinic. Staff msg sent to scheduling pool to help call patient with appt info.

## 2023-04-14 NOTE — Progress Notes (Deleted)
Cardiology Office Note:    Date:  04/14/2023   ID:  Raymond Harrell, DOB 1965/08/10, MRN [id number]  PCP:  Gaspar Garbe, MD  Cardiologist:  Parke Poisson, MD { Click to update primary MD,subspecialty MD or APP then REFRESH:1}    Referring MD: Gaspar Garbe, MD   Chief Complaint: hospital follow-up of chest pain  History of Present Illness:    Raymond Harrell is a 57 y.o. 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 Jacques Navy and presents today for hospital follow-up of chest pain.   Patient was first seen by Cardiology during recent hospitalization. He was admitted from 03/20/2023 to 03/23/2023 for chest pain. High-sensitivity troponin was minimally elevated at 23 >> 20. Echo showed LVEF of 55% with normal wall motion, moderate asymmetric LVH of the septal segment, and grade 1 diastolic dysfunction. LHC showed mild CAD with only 30% stenosis of 1st Diag. LVEDP was 9 mmHg. He was started on Imdur.   Patient presents today for follow-up. ***  Mild Non-Obstructive CAD Patient was recently admitted for chest pain in 03/2023. LHC showed mild CAD with only 30% stenosis of 1st Diag. - No recurrent chest pain.  - Continue Imdur 30mg  daily.  - Continue aspirin and statin.   Hypertension BP well controlled. *** - Continue Lisinopril 20mg  daily.   Hyperlipidemia Lipid panel in 03/2023 during recent admission: Total Cholesterol 138, Triglycerides 82, HDL 61, LDL 61. LDL goal <70 given CAD.  - Continue Crestor 40mg  daily.   Type 2 Diabetes Mellitus Hemoglobin A1c 7.1 in 03/2023.  - On Metformin. - Management per PCP.   EKGs/Labs/Other Studies Reviewed:    The following studies were reviewed:  Echocardiogram 03/21/2023: Impressions: 1. Left ventricular ejection fraction, by estimation, is 55%. The left  ventricle has normal function.  The left ventricle has no regional wall  motion abnormalities. There is moderate asymmetric left ventricular  hypertrophy of the septal segment. Left  ventricular diastolic parameters are consistent with Grade I diastolic  dysfunction (impaired relaxation).   2. Right ventricular systolic function is normal. The right ventricular  size is normal. Tricuspid regurgitation signal is inadequate for assessing  PA pressure.   3. The mitral valve is normal in structure. No evidence of mitral valve  regurgitation. No evidence of mitral stenosis.   4. The aortic valve is normal in structure. Aortic valve regurgitation is  not visualized. No aortic stenosis is present.   5. The inferior vena cava is normal in size with greater than 50%  respiratory variability, suggesting right atrial pressure of 3 mmHg.  _______________  Left Cardiac Catheterization 03/23/2023:   1st Diag lesion is 30% stenosed.   LV end diastolic pressure is normal.   Very mild diffuse coronary irregularities without obstructive disease Normal LVEDP 9 mm Hg   Plan: risk factor modification  Diagnostic Dominance: Co-dominant      EKG:  EKG not ordered today.   Recent Labs: 03/23/2023: BUN 14; Creatinine, Ser 1.05; Hemoglobin 12.8; Magnesium 1.9; Platelets 194; Potassium 4.4; Sodium 137; TSH 1.554  Recent Lipid Panel    Component Value Date/Time   CHOL 138 03/21/2023 0839   TRIG 82 03/21/2023 0839   HDL 61 03/21/2023 0839   CHOLHDL 2.3 03/21/2023 0839   VLDL 16 03/21/2023 0839   LDLCALC 61 03/21/2023 0839    Physical Exam:    Vital Signs: There  were no vitals taken for this visit.    Wt Readings from Last 3 Encounters:  01/07/21 260 lb 6.4 oz (118.1 kg)  12/19/18 261 lb 0.4 oz (118.4 kg)  12/09/17 250 lb (113.4 kg)     General: 57 y.o. male in no acute distress. HEENT: Normocephalic and atraumatic. Sclera clear.  Neck: Supple. No carotid bruits. No JVD. Heart: *** RRR. Distinct S1 and S2. No murmurs,  gallops, or rubs.  Lungs: No increased work of breathing. Clear to ausculation bilaterally. No wheezes, rhonchi, or rales.  Abdomen: Soft, non-distended, and non-tender to palpation.  Extremities: No lower extremity edema.  Radial and distal pedal pulses 2+ and equal bilaterally. Skin: Warm and dry. Neuro: No focal deficits. Psych: Normal affect. Responds appropriately.   Assessment:    No diagnosis found.  Plan:     Disposition: Follow up in ***   Signed, Corrin Parker, PA-C  04/14/2023 6:13 PM    [facility]

## 2023-04-27 ENCOUNTER — Ambulatory Visit: Payer: Managed Care, Other (non HMO) | Attending: Student | Admitting: Student
